# Patient Record
Sex: Male | Born: 1970
Health system: Southern US, Community
[De-identification: ages and names within clinical notes are randomized; demographics above are authoritative.]

## PROBLEM LIST (undated history)

## (undated) DIAGNOSIS — I214 Non-ST elevation (NSTEMI) myocardial infarction: Principal | ICD-10-CM

## (undated) DIAGNOSIS — I739 Peripheral vascular disease, unspecified: Secondary | ICD-10-CM

## (undated) DIAGNOSIS — I2510 Atherosclerotic heart disease of native coronary artery without angina pectoris: Principal | ICD-10-CM

## (undated) DIAGNOSIS — I7300 Raynaud's syndrome without gangrene: Secondary | ICD-10-CM

## (undated) DIAGNOSIS — M303 Mucocutaneous lymph node syndrome [Kawasaki]: Secondary | ICD-10-CM

## (undated) DIAGNOSIS — I10 Essential (primary) hypertension: Secondary | ICD-10-CM

## (undated) DIAGNOSIS — J189 Pneumonia, unspecified organism: Secondary | ICD-10-CM

## (undated) DIAGNOSIS — R0600 Dyspnea, unspecified: Secondary | ICD-10-CM

## (undated) DIAGNOSIS — D649 Anemia, unspecified: Secondary | ICD-10-CM

## (undated) DIAGNOSIS — M1990 Unspecified osteoarthritis, unspecified site: Secondary | ICD-10-CM

## (undated) DIAGNOSIS — R011 Cardiac murmur, unspecified: Secondary | ICD-10-CM

## (undated) DIAGNOSIS — E785 Hyperlipidemia, unspecified: Secondary | ICD-10-CM

## (undated) HISTORY — DX: Raynaud's syndrome without gangrene: I73.00

## (undated) HISTORY — PX: TONSILLECTOMY: SUR1361

## (undated) HISTORY — DX: Hyperlipidemia, unspecified: E78.5

---
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
## 2000-11-09 ENCOUNTER — Emergency Department (HOSPITAL_COMMUNITY): Admission: EM | Admit: 2000-11-09 | Discharge: 2000-11-10 | Payer: Self-pay | Admitting: Emergency Medicine

## 2001-09-30 ENCOUNTER — Encounter: Payer: Self-pay | Admitting: Otolaryngology

## 2001-09-30 ENCOUNTER — Ambulatory Visit (HOSPITAL_COMMUNITY): Admission: RE | Admit: 2001-09-30 | Discharge: 2001-09-30 | Payer: Self-pay | Admitting: Otolaryngology

## 2007-09-09 HISTORY — PX: ROTATOR CUFF REPAIR: SHX139

## 2008-05-25 ENCOUNTER — Ambulatory Visit: Payer: Self-pay | Admitting: Family Medicine

## 2009-02-23 ENCOUNTER — Ambulatory Visit: Payer: Self-pay | Admitting: Family Medicine

## 2011-03-02 ENCOUNTER — Emergency Department (HOSPITAL_COMMUNITY)
Admission: EM | Admit: 2011-03-02 | Discharge: 2011-03-02 | Disposition: A | Discharge status: Home / Self Care | Payer: No Typology Code available for payment source | Attending: Emergency Medicine | Admitting: Emergency Medicine

## 2011-03-02 ENCOUNTER — Emergency Department (HOSPITAL_COMMUNITY): Payer: No Typology Code available for payment source

## 2011-03-02 DIAGNOSIS — M25529 Pain in unspecified elbow: Secondary | ICD-10-CM | POA: Insufficient documentation

## 2011-03-02 DIAGNOSIS — M545 Low back pain, unspecified: Secondary | ICD-10-CM | POA: Insufficient documentation

## 2011-03-02 DIAGNOSIS — R079 Chest pain, unspecified: Secondary | ICD-10-CM | POA: Insufficient documentation

## 2011-09-09 DIAGNOSIS — I214 Non-ST elevation (NSTEMI) myocardial infarction: Principal | ICD-10-CM

## 2011-09-09 HISTORY — DX: Non-ST elevation (NSTEMI) myocardial infarction: I21.4

## 2011-09-09 HISTORY — PX: OTHER SURGICAL HISTORY: SHX169

## 2012-05-09 HISTORY — PX: CARDIAC CATHETERIZATION: SHX172

## 2012-05-19 ENCOUNTER — Other Ambulatory Visit: Payer: Self-pay | Admitting: Cardiology

## 2012-05-19 ENCOUNTER — Encounter (HOSPITAL_COMMUNITY): Payer: Self-pay | Admitting: Pharmacy Technician

## 2012-05-20 ENCOUNTER — Encounter (HOSPITAL_COMMUNITY): Payer: Self-pay | Admitting: Pharmacy Technician

## 2012-05-20 NOTE — H&P (Signed)
Subjective:     CC:    1. REFERRED DR Casimiro Needle HILTS [phone] EVALUATE PALPITATIONS.        HPI:  General:  41 year old here for evaluation of palpitations. he's had intermittent palpitations over the past month or so usually lasting less than one minute. In May had an episode when cutting grass, felt an incredible pressure and pain across chest wall. Checked his pulse and felt weak, different rhythm. A few minutes later it happened again. Pressure was occuring more often. Increased frquency. They are quite random but sometimes with exertion. It is a strange sensation for him. Pressure and pain will ease up after kneeling on ground. Felt it at night one time without exertion. he is not taking any supplements. He is currently on a green diet. He does admit to daily marijuana use. He uses this for his arthritis he states. His father had a heart arrhythmia as well as coronary artery disease. He is trying to lose more weight. He has lost approximally 20 pounds on a healthy green diet. No caffeine, decreasing alcohol. Overall he feels better except for palpitations.  .        ROS:  The other elements of the review of systems are negative (12 total elements).       Gyn History:        OB History:        Surgical History: rt shoulder rotator cuff repain 2010, left elbow surgery 1984.        Hospitalization/Major Diagnostic Procedure: surgeries .        Family History: Father: alive 73 yrs heart failure Mother: alive 80 yrs COPD Brother 1: alive 35 yrs A + W Sister 1: alive 40 yrs CVA        Social History:  General:  History of smoking  cigarettes: Never smoked no Smoking.  Alcohol: yes, beer, 2+ per week.  Caffeine: yes, coffee, 2+ servings daily.  Recreational drug use: yes, MJ daily.  Exercise: yes, walks, daily.  Occupation: lawn care.  Marital Status: single.  Children: 2, son (s).        Medications: Ibuprofen 200 MG Tablet 2-4 tabs 2 X weekly, Norco 7.5-325 MG Tablet 1  tablet as needed for pain every 6 hrs, Medication List reviewed and reconciled with the patient       Allergies: N.K.D.A.      Objective:     Vitals: Wt 221, Wt change -11.3 lb, Ht 68.5, BMI 33.11, Pulse sitting 76, BP sitting 144/100, Repeat BP 142/82.       Examination:  General Examination:  GENERAL APPEARANCE alert, oriented, NAD, pleasant.  SKIN: normal, no rash.  HEENT: normal.  HEAD: [place]/AT.  EYES: EOMI, Conjunctiva clear.  NECK: supple, FROM, without evidence of thyromegaly, adenopathy, or bruits, no jugular venous distention (JVD).  LUNGS: clear to auscultation bilaterally, no wheezes, rhonchi, rales, regular breathing rate and effort.  HEART: regular rate and rhythm, no S3, S4, murmur or rub, point of maximul impulse (PMI) normal.  ABDOMEN: soft, non-tender/non-distended, bowel sounds present, no masses palpated, no bruit.  EXTREMITIES: no clubbing, no edema, pulses 2 plus bilaterally.  NEUROLOGIC EXAM: non-focal exam, alert and oriented x 3.  PERIPHERAL PULSES: normal (2+) bilaterally.  LYMPH NODES: no cervical adenopathy.  PSYCH affect normal.        Assessment:     Assessment:  1. Chest pressure - 786.59 (Primary)  2. Palpitations - 785.1  3. Observation for suspected cardiovascular disease - V71.7  4.  Abnormal ECG - 794.31    Plan:     1. Chest pressure  Diagnostic Imaging:EC Stress Test [facility]mark (Ordered for 05/14/2012) Abnormal, nonsustained ventricular tachycardia, severe chest pain, ST segment depression Abnormal exercise treadmill test with nonsustained ventricular tachycardia, ST segment depression diffusely, symptoms of chest discomfort. High risk study. Nuclear perfusion images were not performed at stress due to patient's severe symptoms, radioisotope was not injected. I discussed findings with patient and we have decided to proceed with cardiac catheterization. Radial artery approach preferable. I have prescribed him both nitroglycerin as well as  metoprolol. If symptoms worsen or become more worrisome between now and urgent catheterization he does to probably go to the emergency department. By the time recovery phase was over, his symptoms had resolved. Risks and benefits of the catheterization were discussed including stroke, heart attack, death. SKAINS,MARK 2012-06-06 09:07:34 AM > discussed with patient., EC Echocardiogram (Ordered for 05/14/2012)  Given his symptoms, exertional chest discomfort, EKG findings of T wave inversion in the precordial leads, I would like to proceed with nuclear stress test.       2. Palpitations  Diagnostic Imaging:EC Holter 24hr (Ordered for 05/14/2012)  Certainly may be PVCs or PACs. I want to make sure that these or not adverse arrhythmias such as ventricular tachycardia. I will check a 24-hour monitor.       3. Observation for suspected cardiovascular disease  Diagnostic Imaging:EKG T-wave inversion in the precordial leads, Plummer,Wanda 05/14/2012 11:04:05 AM > SKAINS,MARK 05/14/2012 11:10:06 AM > cannot exclude ischemia.       4. Abnormal ECG  T-wave inversion noted in the precordial leads. Cannot exclude ischemia. Checking stress test. I will also check an echocardiogram to ensure proper structure and function. Sometimes these EKG findings are seen in left ventricular hypertrophy.        Immunizations:        Labs:        Procedure Codes: 45409 EKG I AND R       Preventive:   CC: Dr. Prince Rome.

## 2012-05-21 ENCOUNTER — Encounter (HOSPITAL_COMMUNITY)
Admission: RE | Disposition: A | Discharge status: Home / Self Care | Payer: Self-pay | Source: Ambulatory Visit | Attending: Cardiology

## 2012-05-21 ENCOUNTER — Ambulatory Visit (HOSPITAL_COMMUNITY)
Admission: RE | Admit: 2012-05-21 | Discharge: 2012-05-22 | Disposition: A | Discharge status: Home / Self Care | Payer: Managed Care, Other (non HMO) | Source: Ambulatory Visit | Attending: Cardiology | Admitting: Cardiology

## 2012-05-21 ENCOUNTER — Encounter (HOSPITAL_COMMUNITY): Payer: Self-pay | Admitting: Cardiology

## 2012-05-21 DIAGNOSIS — Z955 Presence of coronary angioplasty implant and graft: Secondary | ICD-10-CM

## 2012-05-21 DIAGNOSIS — R9439 Abnormal result of other cardiovascular function study: Secondary | ICD-10-CM | POA: Diagnosis present

## 2012-05-21 DIAGNOSIS — I2510 Atherosclerotic heart disease of native coronary artery without angina pectoris: Secondary | ICD-10-CM | POA: Insufficient documentation

## 2012-05-21 HISTORY — DX: Atherosclerotic heart disease of native coronary artery without angina pectoris: I25.10

## 2012-05-21 HISTORY — PX: PERCUTANEOUS CORONARY STENT INTERVENTION (PCI-S): SHX5485

## 2012-05-21 HISTORY — PX: LEFT HEART CATHETERIZATION WITH CORONARY ANGIOGRAM: SHX5451

## 2012-05-21 SURGERY — LEFT HEART CATHETERIZATION WITH CORONARY ANGIOGRAM
Anesthesia: LOCAL

## 2012-05-21 MED ORDER — PRASUGREL HCL 10 MG PO TABS
ORAL_TABLET | ORAL | Status: AC
  Filled 2012-05-21: qty 6

## 2012-05-21 MED ORDER — ALPRAZOLAM 0.25 MG PO TABS
0.2500 mg | ORAL_TABLET | Freq: Every evening | ORAL | Status: AC | PRN
  Administered 2012-05-21: 21:00:00 0.25 mg via ORAL
  Filled 2012-05-21: qty 1

## 2012-05-21 MED ORDER — DIAZEPAM 5 MG PO TABS
5.0000 mg | ORAL_TABLET | ORAL | Status: AC
  Administered 2012-05-21: 5 mg via ORAL

## 2012-05-21 MED ORDER — ASPIRIN 81 MG PO CHEW
81.0000 mg | CHEWABLE_TABLET | Freq: Every day | ORAL | Status: DC
  Filled 2012-05-21: qty 1

## 2012-05-21 MED ORDER — METOPROLOL SUCCINATE ER 50 MG PO TB24
50.0000 mg | ORAL_TABLET | Freq: Every day | ORAL | Status: DC
  Administered 2012-05-21: 16:00:00 50 mg via ORAL
  Filled 2012-05-21 (×2): qty 1

## 2012-05-21 MED ORDER — FENTANYL CITRATE 0.05 MG/ML IJ SOLN
INTRAMUSCULAR | Status: AC
  Filled 2012-05-21: qty 2

## 2012-05-21 MED ORDER — ASPIRIN 81 MG PO CHEW
324.0000 mg | CHEWABLE_TABLET | ORAL | Status: AC
  Administered 2012-05-21: 324 mg via ORAL

## 2012-05-21 MED ORDER — VERAPAMIL HCL 2.5 MG/ML IV SOLN
INTRAVENOUS | Status: AC
  Filled 2012-05-21: qty 2

## 2012-05-21 MED ORDER — NITROGLYCERIN 0.2 MG/ML ON CALL CATH LAB
INTRAVENOUS | Status: AC
  Filled 2012-05-21: qty 1

## 2012-05-21 MED ORDER — PRASUGREL HCL 10 MG PO TABS
10.0000 mg | ORAL_TABLET | Freq: Every day | ORAL | Status: DC
  Administered 2012-05-22: 10 mg via ORAL
  Filled 2012-05-21 (×2): qty 1

## 2012-05-21 MED ORDER — ATORVASTATIN CALCIUM 40 MG PO TABS
40.0000 mg | ORAL_TABLET | Freq: Every day | ORAL | Status: DC
  Administered 2012-05-21: 40 mg via ORAL
  Filled 2012-05-21 (×2): qty 1

## 2012-05-21 MED ORDER — ONDANSETRON HCL 4 MG/2ML IJ SOLN: 4.0000 mg | Freq: Four times a day (QID) | INTRAMUSCULAR | Status: DC | PRN

## 2012-05-21 MED ORDER — ACETAMINOPHEN 325 MG PO TABS
650.0000 mg | ORAL_TABLET | ORAL | Status: DC | PRN
  Administered 2012-05-21 (×2): 650 mg via ORAL
  Filled 2012-05-21 (×2): qty 2

## 2012-05-21 MED ORDER — HEPARIN SODIUM (PORCINE) 1000 UNIT/ML IJ SOLN
INTRAMUSCULAR | Status: AC
  Filled 2012-05-21: qty 1

## 2012-05-21 MED ORDER — DIAZEPAM 5 MG PO TABS
ORAL_TABLET | ORAL | Status: AC
  Administered 2012-05-21: 5 mg via ORAL
  Filled 2012-05-21: qty 1

## 2012-05-21 MED ORDER — MIDAZOLAM HCL 2 MG/2ML IJ SOLN
INTRAMUSCULAR | Status: AC
  Filled 2012-05-21: qty 2

## 2012-05-21 MED ORDER — SODIUM CHLORIDE 0.9 % IJ SOLN: 3.0000 mL | INTRAMUSCULAR | Status: DC | PRN

## 2012-05-21 MED ORDER — BIVALIRUDIN 250 MG IV SOLR
INTRAVENOUS | Status: AC
  Filled 2012-05-21: qty 250

## 2012-05-21 MED ORDER — MORPHINE SULFATE 4 MG/ML IJ SOLN
INTRAMUSCULAR | Status: AC
  Filled 2012-05-21: qty 1

## 2012-05-21 MED ORDER — DIAZEPAM 5 MG PO TABS
5.0000 mg | ORAL_TABLET | Freq: Once | ORAL | Status: AC
  Administered 2012-05-21: 5 mg via ORAL

## 2012-05-21 MED ORDER — MORPHINE SULFATE 4 MG/ML IJ SOLN
1.0000 mg | INTRAMUSCULAR | Status: DC | PRN
  Administered 2012-05-21: 2 mg via INTRAVENOUS

## 2012-05-21 MED ORDER — LIDOCAINE HCL (PF) 1 % IJ SOLN
INTRAMUSCULAR | Status: AC
  Filled 2012-05-21: qty 30

## 2012-05-21 MED ORDER — SODIUM CHLORIDE 0.9 % IJ SOLN: 3.0000 mL | Freq: Two times a day (BID) | INTRAMUSCULAR | Status: DC

## 2012-05-21 MED ORDER — SODIUM CHLORIDE 0.9 % IV SOLN: INTRAVENOUS | Status: DC

## 2012-05-21 MED ORDER — SODIUM CHLORIDE 0.9 % IV SOLN: INTRAVENOUS | Status: AC

## 2012-05-21 MED ORDER — SODIUM CHLORIDE 0.9 % IV SOLN: 250.0000 mL | INTRAVENOUS | Status: DC | PRN

## 2012-05-21 MED ORDER — HEPARIN (PORCINE) IN NACL 2-0.9 UNIT/ML-% IJ SOLN
INTRAMUSCULAR | Status: AC
  Filled 2012-05-21: qty 1000

## 2012-05-21 MED ORDER — NITROGLYCERIN 0.4 MG SL SUBL: 0.4000 mg | SUBLINGUAL_TABLET | SUBLINGUAL | Status: DC | PRN

## 2012-05-21 MED ORDER — DIAZEPAM 5 MG PO TABS
ORAL_TABLET | ORAL | Status: AC
  Filled 2012-05-21: qty 1

## 2012-05-21 MED ORDER — ASPIRIN 81 MG PO CHEW
CHEWABLE_TABLET | ORAL | Status: AC
  Administered 2012-05-21: 324 mg via ORAL
  Filled 2012-05-21: qty 4

## 2012-05-21 NOTE — CV Procedure (Signed)
CARDIAC CATHETERIZATION  PROCEDURE:  Left heart catheterization with selective coronary angiography, left ventriculogram via the radial artery approach.  INDICATIONS:  41 year old male with abnormal exercise treadmill test, 7 beats of nonsustained ventricular tachycardia, polymorphic with severe chest pain, ST segment depression diffusely. He is nondiabetic. His father had coronary artery disease. Several uncles had coronary artery disease.  The risks, benefits, and details of the procedure were explained to the patient, including possibilities of stroke, heart attack, death, renal impairment, arterial damage, bleeding.  The patient verbalized understanding and wanted to proceed.  Informed written consent was obtained.  PROCEDURE TECHNIQUE:  Allen's test was performed pre-and post procedure and was normal. The right radial artery site was prepped and draped in a sterile fashion. One percent lidocaine was used for local anesthesia. Using the modified Seldinger technique a 5 French hydrophilic sheath was inserted into the radial artery without difficulty. 3 mg of verapamil was administered via the sheath. A Judkins right #4 catheter with the guidance of a Versicore wire was placed in the right coronary cusp and selectively cannulated the right coronary artery. After traversing the aortic arch, 5000 units of heparin IV was administered. A Judkins left #3.5 catheter was used to selectively cannulate the left main artery. Multiple views with hand injection of Omnipaque were obtained. Catheter a pigtail catheter was used to cross into the left ventricle, hemodynamics were obtained, and a left ventriculogram was performed in the RAO position with power injection. Following the procedure, sheath was removed, patient was hemodynamically stable, hemostasis was maintained with a Terumo T band.   CONTRAST:  Total of 65 ml.    FLOUROSCOPY TIME: 2.5 min.  COMPLICATIONS:  None.    HEMODYNAMICS:  Aortic pressure was  135/43mmHg; LV systolic pressure was ; LVEDP .  There was no gradient between the left ventricle and aorta.    ANGIOGRAPHIC DATA:    Left main: No angiographically significant disease.  Left anterior descending (LAD): There is a focal 95% stenosis in the mid LAD at the bifurcation of the first diagonal branch. The diagonal branch is quite large in caliber. The remainder of the vessel is widely patent. There is hazy calcification in the region of the stenosis.  Circumflex artery (CIRC): 1 large obtuse marginal branch, tortuous. Large in caliber, no angiographically significant disease.  Right coronary artery (RCA): No angiographically significant disease. Dominant vessel giving rise to the PDA.  LEFT VENTRICULOGRAM:  Left ventricular angiogram was done in the 30 RAO projection and revealed normal left ventricular wall motion and systolic function with an estimated ejection fraction of 60%.   IMPRESSIONS:  95% mid LAD lesion, mildly calcified with a branching diagonal just prior to the lesion. Normal left ventricular systolic function.  LVEDP 8 mmHg.  Ejection fraction 60%.  RECOMMENDATION:  I discussed case with patient, Dr. Eldridge Dace and we will proceed with percutaneous intervention. I explained risks of emergency bypass, stroke, heart attack, death.

## 2012-05-21 NOTE — CV Procedure (Signed)
PROCEDURE:  PCI LAD  INDICATIONS:  High risk stress test showing polymorphic VT with exercise; class 3 angina  The risks, benefits, and details of the procedure were explained to the patient.  The patient verbalized understanding and wanted to proceed.  Informed written consent was obtained.  PROCEDURE TECHNIQUE: The diagnostic catheterization was performed by Dr. Anne Fu which revealed a 90% mid LAD stenosis at the origin of the diagonal.  A 6 French sheath was used.  Angiomax used for anticoagulation.  An ACT was used to confirm that the Angiomax is therapeutic.  A CLS 3.0 guiding catheters used to engage the left main.  A pro-water wire was placed down the LAD.  A BMW wire was placed into the adjacent diagonal vessel.  A 2.0 x 12 balloon was advanced to the LAD stenosis and inflated to 12 atmospheres.  A 2.5 x 10 angiosculpt balloon was then advanced to the lesion and inflated to 10 atmospheres.  A 3.5 x 24 Promus drug-eluting stent was then deployed across the entire area disease in the mid LAD and across the ostium of the diagonal vessel adjacent to the lesion.  This is deployed at 12 atmospheres.  It was initially noted that the stent was not occlusive when we're positioning it.  Then the stent did become occlusive such that we did not see the distal vessel very well.  The stent was deployed.  Subsequent angiography revealed a new stenosis at the distal edge of the stent of 90%.  Several doses of nitroglycerin were given to see if this would relieve spasm, and particularly the cause of this lesion.  There was no significant change.  A 2.0 x 15 balloon was used to predilate this area.  A 3.0 x 12 Promus stent was then placed in overlapping fashion at the distal edge of the previously placed stent.  He was inflated 12 atmospheres.  There is an excellent angiographic result.  TIMI-3 flow was restored.  The BMW wire was placed back into the diagonal through the stent struts.  The entire stented area was  postdilated with a 3.5 x 15 Ruskin trek balloon inflated to 16 atmospheres in the mid area and 18 atmospheres proximally.  Several additional doses of intracoronary nitroglycerin were administered.    Flow remained in the diagonal vessel, but there was an ostial 80% stenosis in the diagonal.  A TR band was used for hemostasis.   CONTRAST:  Total of 100 cc for the PCI.  COMPLICATIONS:  None.     IMPRESSIONS:  1. Successful revascularization of the mid LAD with 2 overlapping Promus drug-eluting stents. 3.0 x 12, and 3.5 x 24 more proximally.  Some plaque shift noted into a medium-sized diagonal vessel but TIMI-3 flow remained in this vessel.  The patient had typical anginal symptoms with balloon inflation.  RECOMMENDATION:  He'll be watched overnight, given the plaque shift into the diagonal.  Would use Effient for 30 days along with aspirin.  Following that period, could consider switching to Plavix.  He will need dual antiplatelet therapy for at least a year if not longer.  Would prefer using the stronger antiplatelet agent initially do to the plaque shift in the diagonal and wanting to maintain maximal flow in this branch vessel.  Continue aggressive secondary prevention.

## 2012-05-22 LAB — CBC
Hemoglobin: 14.8 g/dL (ref 13.0–17.0)
MCV: 88.9 fL (ref 78.0–100.0)
Platelets: 269 10*3/uL (ref 150–400)
RBC: 5.04 MIL/uL (ref 4.22–5.81)
WBC: 8.8 10*3/uL (ref 4.0–10.5)

## 2012-05-22 LAB — BASIC METABOLIC PANEL
CO2: 29 mEq/L (ref 19–32)
Chloride: 103 mEq/L (ref 96–112)
Creatinine, Ser: 1.02 mg/dL (ref 0.50–1.35)

## 2012-05-22 MED ORDER — ATORVASTATIN CALCIUM 40 MG PO TABS: 40.0000 mg | ORAL_TABLET | Freq: Every day | ORAL | Status: DC

## 2012-05-22 MED ORDER — ASPIRIN 81 MG PO CHEW: 81.0000 mg | CHEWABLE_TABLET | Freq: Every day | ORAL | Status: AC

## 2012-05-22 MED ORDER — PRASUGREL HCL 10 MG PO TABS: 10.0000 mg | ORAL_TABLET | Freq: Every day | ORAL | Status: DC

## 2012-05-22 MED ORDER — NITROGLYCERIN 0.4 MG SL SUBL: 0.4000 mg | SUBLINGUAL_TABLET | SUBLINGUAL | Status: DC | PRN

## 2012-05-22 NOTE — Progress Notes (Signed)
CARDIAC REHAB PHASE I   PRE:  Rate/Rhythm: 67 SR  BP:  Supine: [address]  Sitting:   Standing:    SaO2: 99 RA  MODE:  Ambulation: 1000 ft   POST:  Rate/Rhythem: 85 SR  BP:  Supine:   Sitting: 134/90  Standing:    SaO2:  0735-0850 Pt tolerated ambulation well without c/o of cp or SOB. VS stable. Completed discharge education with tp. He voices understanding. Pt agrees to Outpt. CRP in [facility], will send referral.  Beatrix Fetters

## 2012-05-22 NOTE — Discharge Summary (Signed)
Patient ID: MEHRAN GUDERIAN MRN: [id number] DOB/AGE: 11-17-1970 41 y.o.  Admit date: 05/21/2012 Discharge date: 05/22/2012  Primary Discharge Diagnosis: Severe LAD CAD Secondary Discharge Diagnosis: Family history of CAD  Significant Diagnostic Studies: Cath 9/13 Successful revascularization of the mid LAD with 2 overlapping Promus drug-eluting stents. 3.0 x 12, and 3.5 x 24 more proximally. Some plaque shift noted into a medium-sized diagonal vessel but TIMI-3 flow remained in this vessel. The patient had typical anginal symptoms with balloon inflation.  Left main: No angiographically significant disease.  Left anterior descending (LAD): There is a focal 95% stenosis in the mid LAD at the bifurcation of the first diagonal branch. The diagonal branch is quite large in caliber. The remainder of the vessel is widely patent. There is hazy calcification in the region of the stenosis.  Circumflex artery (CIRC): 1 large obtuse marginal branch, tortuous. Large in caliber, no angiographically significant disease.  Right coronary artery (RCA): No angiographically significant disease. Dominant vessel giving rise to the PDA.  LEFT VENTRICULOGRAM: Left ventricular angiogram was done in the 30 RAO projection and revealed normal left ventricular wall motion and systolic function with an estimated ejection fraction of 60%.  IMPRESSIONS:  1. 95% mid LAD lesion, mildly calcified with a branching diagonal just prior to the lesion. 2. Normal left ventricular systolic function. LVEDP 8 mmHg. Ejection fraction 60%.  Did well overnight. No issues. No CP, ambulating well. No SOB.  41 year old male with abnormal exercise treadmill test, 7 beats of nonsustained ventricular tachycardia, polymorphic with severe chest pain, ST segment depression diffusely. He is nondiabetic. His father had coronary artery disease. Several uncles had coronary artery disease.   Discharge Exam: Blood pressure 131/84, pulse 65,  temperature 97.8 F (36.6 C), temperature source Oral, resp. rate 12, height 5\' 10"  (1.778 m), weight 99.5 kg (219 lb 5.7 oz), SpO2 99.00%.    GEN: AAO x 3 in NAD CV RRR no M LUNGS CTA ABD soft +BS EXT - rad site 2+ pulse no hematoma   FOLLOW UP PLANS AND APPOINTMENTS Discharge Orders    Future Orders Please Complete By Expires   Diet - low sodium heart healthy      Increase activity slowly          Medication List     As of 05/22/2012  7:15 AM    TAKE these medications         aspirin 81 MG chewable tablet   Chew 1 tablet (81 mg total) by mouth daily.      atorvastatin 40 MG tablet   Commonly known as: LIPITOR   Take 1 tablet (40 mg total) by mouth daily at 6 PM.      Glucosamine HCl 1000 MG Tabs   Take 1,000 mg by mouth 2 (two) times daily.      metoprolol succinate 50 MG 24 hr tablet   Commonly known as: TOPROL-XL   Take 50 mg by mouth daily. Take with or immediately following a meal.      nitroGLYCERIN 0.4 MG SL tablet   Commonly known as: NITROSTAT   Place 0.4 mg under the tongue every 5 (five) minutes as needed. For chest pain      prasugrel 10 MG Tabs   Commonly known as: EFFIENT   Take 1 tablet (10 mg total) by mouth daily.           Follow-up Information    Follow up with Donato Schultz, MD. In 1 week.   Contact information:  [address] [place] [address] [phone]          BRING ALL MEDICATIONS WITH YOU TO FOLLOW UP APPOINTMENTS  Time spent with patient to include physician time: Signed: SKAINS, MARK 05/22/2012, 7:15 AM

## 2012-05-22 NOTE — Plan of Care (Signed)
Problem: Phase I Progression Outcomes Goal: Pain controlled with appropriate interventions Outcome: Completed/Met Date Met:  05/22/12 Patient painfree tonight Goal: Initial discharge plan identified Outcome: Completed/Met Date Met:  05/22/12 Planned discharge in am to home Goal: Voiding-avoid urinary catheter unless indicated Outcome: Completed/Met Date Met:  05/22/12 Urinating adequate amounts in urinal Goal: Hemodynamically stable Outcome: Completed/Met Date Met:  05/22/12 VSS Goal: Distal pulses equal to baseline Outcome: Completed/Met Date Met:  05/22/12 Distal pulses, dp and radial +2 , right radial site positive reverse allens, right radial and right ulnar +2 palpable Goal: Vascular site scale level 0 - I Vascular Site Scale Level 0: No bruising/bleeding/hematoma Level I (Mild): Bruising/Ecchymosis, minimal bleeding/ooozing, palpable hematoma < 3 cm Level II (Moderate): Bleeding not affecting hemodynamic parameters, pseudoaneurysm, palpable hematoma > 3 cm Outcome: Completed/Met Date Met:  05/22/12 Level 0 gauze dressing dry and intact Goal: Post Cath/PCI return to appropriate Path Outcome: Not Applicable Date Met:  05/22/12 Discharge to home in am no need to add additional care plan

## 2012-05-23 NOTE — Interval H&P Note (Signed)
History and Physical Interval Note:  05/23/2012 6:33 AM  Gabriel Anderson  has presented today for surgery, with the diagnosis of Chest pain  The various methods of treatment have been discussed with the patient and family. After consideration of risks, benefits and other options for treatment, the patient has consented to  Procedure(s) (LRB) with comments: LEFT HEART CATHETERIZATION WITH CORONARY ANGIOGRAM (N/A) PERCUTANEOUS CORONARY STENT INTERVENTION (PCI-S) () as a surgical intervention .  The patient's history has been reviewed, patient examined, no change in status, stable for surgery.  I have reviewed the patient's chart and labs.  Questions were answered to the patient's satisfaction.     Gabriel Anderson

## 2012-05-23 NOTE — H&P (View-Only) (Signed)
Subjective:     CC:    1. REFERRED DR MICHAEL HILTS [phone] EVALUATE PALPITATIONS.        HPI:  General:  41-year-old here for evaluation of palpitations. he's had intermittent palpitations over the past month or so usually lasting less than one minute. In May had an episode when cutting grass, felt an incredible pressure and pain across chest wall. Checked his pulse and felt weak, different rhythm. A few minutes later it happened again. Pressure was occuring more often. Increased frquency. They are quite random but sometimes with exertion. It is a strange sensation for him. Pressure and pain will ease up after kneeling on ground. Felt it at night one time without exertion. he is not taking any supplements. He is currently on a green diet. He does admit to daily marijuana use. He uses this for his arthritis he states. His father had a heart arrhythmia as well as coronary artery disease. He is trying to lose more weight. He has lost approximally 20 pounds on a healthy green diet. No caffeine, decreasing alcohol. Overall he feels better except for palpitations.  .        ROS:  The other elements of the review of systems are negative (12 total elements).       Gyn History:        OB History:        Surgical History: rt shoulder rotator cuff repain 2010, left elbow surgery 1984.        Hospitalization/Major Diagnostic Procedure: surgeries .        Family History: Father: alive 62 yrs heart failure Mother: alive 67 yrs COPD Brother 1: alive 35 yrs A + W Sister 1: alive 40 yrs CVA        Social History:  General:  History of smoking  cigarettes: Never smoked no Smoking.  Alcohol: yes, beer, 2+ per week.  Caffeine: yes, coffee, 2+ servings daily.  Recreational drug use: yes, MJ daily.  Exercise: yes, walks, daily.  Occupation: lawn care.  Marital Status: single.  Children: 2, son (s).        Medications: Ibuprofen 200 MG Tablet 2-4 tabs 2 X weekly, Norco 7.5-325 MG Tablet 1  tablet as needed for pain every 6 hrs, Medication List reviewed and reconciled with the patient       Allergies: N.K.D.A.      Objective:     Vitals: Wt 221, Wt change -11.3 lb, Ht 68.5, BMI 33.11, Pulse sitting 76, BP sitting 144/100, Repeat BP 142/82.       Examination:  General Examination:  GENERAL APPEARANCE alert, oriented, NAD, pleasant.  SKIN: normal, no rash.  HEENT: normal.  HEAD: [place]/AT.  EYES: EOMI, Conjunctiva clear.  NECK: supple, FROM, without evidence of thyromegaly, adenopathy, or bruits, no jugular venous distention (JVD).  LUNGS: clear to auscultation bilaterally, no wheezes, rhonchi, rales, regular breathing rate and effort.  HEART: regular rate and rhythm, no S3, S4, murmur or rub, point of maximul impulse (PMI) normal.  ABDOMEN: soft, non-tender/non-distended, bowel sounds present, no masses palpated, no bruit.  EXTREMITIES: no clubbing, no edema, pulses 2 plus bilaterally.  NEUROLOGIC EXAM: non-focal exam, alert and oriented x 3.  PERIPHERAL PULSES: normal (2+) bilaterally.  LYMPH NODES: no cervical adenopathy.  PSYCH affect normal.        Assessment:     Assessment:  1. Chest pressure - 786.59 (Primary)  2. Palpitations - 785.1  3. Observation for suspected cardiovascular disease - V71.7  4.   Abnormal ECG - 794.31    Plan:     1. Chest pressure  Diagnostic Imaging:EC Stress Test [facility] (Ordered for 05/14/2012) Abnormal, nonsustained ventricular tachycardia, severe chest pain, ST segment depression Abnormal exercise treadmill test with nonsustained ventricular tachycardia, ST segment depression diffusely, symptoms of chest discomfort. High risk study. Nuclear perfusion images were not performed at stress due to patient's severe symptoms, radioisotope was not injected. I discussed findings with patient and we have decided to proceed with cardiac catheterization. Radial artery approach preferable. I have prescribed him both nitroglycerin as well as  metoprolol. If symptoms worsen or become more worrisome between now and urgent catheterization he does to probably go to the emergency department. By the time recovery phase was over, his symptoms had resolved. Risks and benefits of the catheterization were discussed including stroke, heart attack, death. SKAINS,MARK 05/20/2012 09:07:34 AM > discussed with patient., EC Echocardiogram (Ordered for 05/14/2012)  Given his symptoms, exertional chest discomfort, EKG findings of T wave inversion in the precordial leads, I would like to proceed with nuclear stress test.       2. Palpitations  Diagnostic Imaging:EC Holter 24hr (Ordered for 05/14/2012)  Certainly may be PVCs or PACs. I want to make sure that these or not adverse arrhythmias such as ventricular tachycardia. I will check a 24-hour monitor.       3. Observation for suspected cardiovascular disease  Diagnostic Imaging:EKG T-wave inversion in the precordial leads, Plummer,Wanda 05/14/2012 11:04:05 AM > SKAINS,MARK 05/14/2012 11:10:06 AM > cannot exclude ischemia.       4. Abnormal ECG  T-wave inversion noted in the precordial leads. Cannot exclude ischemia. Checking stress test. I will also check an echocardiogram to ensure proper structure and function. Sometimes these EKG findings are seen in left ventricular hypertrophy.        Immunizations:        Labs:        Procedure Codes: 93000 EKG I AND R       Preventive:   CC: Dr. Hilts.    

## 2012-05-24 MED FILL — Dextrose Inj 5%: INTRAVENOUS | Qty: 1000 | Status: AC

## 2012-06-01 ENCOUNTER — Encounter (HOSPITAL_COMMUNITY): Payer: Self-pay | Admitting: *Deleted

## 2012-06-01 ENCOUNTER — Inpatient Hospital Stay (HOSPITAL_COMMUNITY)
Admission: EM | Admit: 2012-06-01 | Discharge: 2012-06-03 | DRG: 282 | Disposition: A | Discharge status: Home / Self Care | Payer: Managed Care, Other (non HMO) | Attending: Cardiology | Admitting: Cardiology

## 2012-06-01 ENCOUNTER — Emergency Department (HOSPITAL_COMMUNITY): Payer: Managed Care, Other (non HMO)

## 2012-06-01 DIAGNOSIS — I214 Non-ST elevation (NSTEMI) myocardial infarction: Principal | ICD-10-CM | POA: Diagnosis present

## 2012-06-01 DIAGNOSIS — F1210 Cannabis abuse, uncomplicated: Secondary | ICD-10-CM | POA: Diagnosis present

## 2012-06-01 DIAGNOSIS — R079 Chest pain, unspecified: Secondary | ICD-10-CM

## 2012-06-01 DIAGNOSIS — M25519 Pain in unspecified shoulder: Secondary | ICD-10-CM | POA: Diagnosis present

## 2012-06-01 DIAGNOSIS — I10 Essential (primary) hypertension: Secondary | ICD-10-CM | POA: Diagnosis present

## 2012-06-01 DIAGNOSIS — F411 Generalized anxiety disorder: Secondary | ICD-10-CM | POA: Diagnosis present

## 2012-06-01 DIAGNOSIS — I498 Other specified cardiac arrhythmias: Secondary | ICD-10-CM | POA: Diagnosis present

## 2012-06-01 DIAGNOSIS — E7800 Pure hypercholesterolemia, unspecified: Secondary | ICD-10-CM

## 2012-06-01 DIAGNOSIS — I2510 Atherosclerotic heart disease of native coronary artery without angina pectoris: Secondary | ICD-10-CM | POA: Diagnosis present

## 2012-06-01 DIAGNOSIS — Z9861 Coronary angioplasty status: Secondary | ICD-10-CM

## 2012-06-01 DIAGNOSIS — I4949 Other premature depolarization: Secondary | ICD-10-CM | POA: Diagnosis present

## 2012-06-01 DIAGNOSIS — E785 Hyperlipidemia, unspecified: Secondary | ICD-10-CM | POA: Diagnosis present

## 2012-06-01 DIAGNOSIS — Z7982 Long term (current) use of aspirin: Secondary | ICD-10-CM

## 2012-06-01 HISTORY — DX: Non-ST elevation (NSTEMI) myocardial infarction: I21.4

## 2012-06-01 HISTORY — DX: Essential (primary) hypertension: I10

## 2012-06-01 LAB — CBC WITH DIFFERENTIAL/PLATELET
Eosinophils Relative: 4 % (ref 0–5)
HCT: 36.7 % — ABNORMAL LOW (ref 39.0–52.0)
Lymphocytes Relative: 39 % (ref 12–46)
Lymphs Abs: 3.3 10*3/uL (ref 0.7–4.0)
MCV: 87.2 fL (ref 78.0–100.0)
Monocytes Absolute: 0.8 10*3/uL (ref 0.1–1.0)
RBC: 4.21 MIL/uL — ABNORMAL LOW (ref 4.22–5.81)
RDW: 12.5 % (ref 11.5–15.5)
WBC: 8.4 10*3/uL (ref 4.0–10.5)

## 2012-06-01 LAB — COMPREHENSIVE METABOLIC PANEL
BUN: 9 mg/dL (ref 6–23)
CO2: 30 mEq/L (ref 19–32)
Calcium: 9.3 mg/dL (ref 8.4–10.5)
Creatinine, Ser: 0.98 mg/dL (ref 0.50–1.35)
GFR calc Af Amer: >90 mL/min (ref >90)
GFR calc non Af Amer: >90 mL/min (ref >90)
Glucose, Bld: 79 mg/dL (ref 70–99)

## 2012-06-01 LAB — PROTIME-INR: Prothrombin Time: 13.9 seconds (ref 11.6–15.2)

## 2012-06-01 LAB — TROPONIN I: Troponin I: <0.3 ng/mL (ref <0.30)

## 2012-06-01 MED ORDER — NITROGLYCERIN IN D5W 200-5 MCG/ML-% IV SOLN
3.0000 ug/min | INTRAVENOUS | Status: DC
  Administered 2012-06-01: 3.333 ug/min via INTRAVENOUS
  Filled 2012-06-01: qty 250

## 2012-06-01 MED ORDER — NITROGLYCERIN 0.4 MG SL SUBL: 0.4000 mg | SUBLINGUAL_TABLET | SUBLINGUAL | Status: DC | PRN

## 2012-06-01 MED ORDER — ATORVASTATIN CALCIUM 40 MG PO TABS
40.0000 mg | ORAL_TABLET | Freq: Every day | ORAL | Status: DC
  Administered 2012-06-02: 21:00:00 40 mg via ORAL
  Filled 2012-06-01 (×3): qty 1

## 2012-06-01 MED ORDER — ONDANSETRON HCL 4 MG/2ML IJ SOLN: 4.0000 mg | Freq: Four times a day (QID) | INTRAMUSCULAR | Status: DC | PRN

## 2012-06-01 MED ORDER — METOPROLOL SUCCINATE ER 25 MG PO TB24
25.0000 mg | ORAL_TABLET | Freq: Every day | ORAL | Status: DC
  Filled 2012-06-01 (×2): qty 1

## 2012-06-01 MED ORDER — SODIUM CHLORIDE 0.9 % IV BOLUS (SEPSIS)
1000.0000 mL | Freq: Once | INTRAVENOUS | Status: AC
  Administered 2012-06-01: 1000 mL via INTRAVENOUS

## 2012-06-01 MED ORDER — HEPARIN BOLUS VIA INFUSION
4000.0000 [IU] | Freq: Once | INTRAVENOUS | Status: AC
  Administered 2012-06-02: 4000 [IU] via INTRAVENOUS

## 2012-06-01 MED ORDER — PRASUGREL HCL 10 MG PO TABS
10.0000 mg | ORAL_TABLET | Freq: Every day | ORAL | Status: DC
  Administered 2012-06-02 – 2012-06-03 (×2): 10 mg via ORAL
  Filled 2012-06-01 (×3): qty 1

## 2012-06-01 MED ORDER — HEPARIN (PORCINE) IN NACL 100-0.45 UNIT/ML-% IJ SOLN
1200.0000 [IU]/h | INTRAMUSCULAR | Status: DC
  Administered 2012-06-02: 1200 [IU]/h via INTRAVENOUS
  Filled 2012-06-01 (×3): qty 250

## 2012-06-01 MED ORDER — ACETAMINOPHEN 325 MG PO TABS: 650.0000 mg | ORAL_TABLET | ORAL | Status: DC | PRN

## 2012-06-01 MED ORDER — ASPIRIN 81 MG PO CHEW
81.0000 mg | CHEWABLE_TABLET | Freq: Every day | ORAL | Status: DC
  Administered 2012-06-02 – 2012-06-03 (×2): 81 mg via ORAL
  Filled 2012-06-01 (×2): qty 1

## 2012-06-01 MED ORDER — GLUCOSAMINE HCL 1000 MG PO TABS: 1000.0000 mg | ORAL_TABLET | Freq: Two times a day (BID) | ORAL | Status: DC

## 2012-06-01 MED ORDER — NITROGLYCERIN 0.4 MG SL SUBL
0.4000 mg | SUBLINGUAL_TABLET | SUBLINGUAL | Status: DC | PRN
  Administered 2012-06-01: 0.4 mg via SUBLINGUAL

## 2012-06-01 NOTE — H&P (Addendum)
Admit date: 06/01/2012 Referring Physician Dr. Jeraldine Loots Primary Cardiologist Dr. Anne Fu Chief complaint/reason for admission: chest pain  HPI: This is a 41yo WM with a history of CAD s/p very recent PCI of the mid LAD at the bifurcation of a large diagonal branch with no other CAD and normal LVF who was doing well post PCI until today.  He awakened today and said he did not feel right.  Midway through the day he had twinges of chest pressure that he had felt before the PCI.  He described that as shooting pains in his chest and around 7:45pm he started having chest pressure and took SL NTG x1.  He thought it might be heartburn and took a TUMS with no relief.  He took a second NTG and called EMS.  He was given another SL NTG x 2 with improvement in chest pain.  He continues to have intermittent chest pressure currently at a 1/10.  It did not radiate and was midsternal.  He was SOB at home along with diaphoresis and pale color per his wife.  Of note according to his PCI report there was some plaque shift into the diagonal during PCI.  He states he did not have any chest pressure during his PCI.  He also says that the symptoms he had tonight are identical to what he had on the treadmill in the office.      PMH:    Past Medical History  Diagnosis Date  . Coronary atherosclerosis of native coronary artery 05/21/2012    Cardiac catheterization 05/21/12-95% mid LAD lesion at the bifurcation of a large diagonal branch. Normal ejection fraction.  . Hypertension     PSH:    Past Surgical History  Procedure Date  . Cardiac catheterization 05/2012    PCI LAD at takeoff of large diagonal    ALLERGIES:   Review of patient's allergies indicates no known allergies.  Prior to Admit Meds:   (Not in a hospital admission) Family HX:   History reviewed. No pertinent family history. Social HX:    History   Social History  . Marital Status: Married    Spouse Name: N/A    Number of Children: N/A  . Years of  Education: N/A   Occupational History  . Not on file.   Social History Main Topics  . Smoking status: Never Smoker   . Smokeless tobacco: Not on file  . Alcohol Use: 0.0 oz/week    3-6 Cans of beer per week  . Drug Use: Yes    Special: Marijuana  . Sexually Active:    Other Topics Concern  . Not on file   Social History Narrative  . No narrative on file     ROS:  All 11 ROS were addressed and are negative except what is stated in the HPI  PHYSICAL EXAM Filed Vitals:   06/01/12 2207  BP: 116/79  Pulse: 50  Temp:   Resp: 22   General: Well developed, well nourished, in no acute distress Head: Eyes PERRLA, No xanthomas.   Normal cephalic and atramatic  Lungs:   Clear bilaterally to auscultation and percussion. Heart:   HRRR S1 S2 Pulses are 2+ & equal.            No carotid bruit. No JVD.  No abdominal bruits. No femoral bruits. Abdomen: Bowel sounds are positive, abdomen soft and non-tender without masses or  Hernia's noted. Extremities:   No clubbing, cyanosis or edema.  DP +1 Neuro: Alert and oriented X 3. Psych:  Good affect, responds appropriately   Labs:   Lab Results  Component Value Date   WBC 8.4 06/01/2012   HGB 12.5* 06/01/2012   HCT 36.7* 06/01/2012   MCV 87.2 06/01/2012   PLT 265 06/01/2012    Lab 06/01/12 2140  NA 141  K 3.8  CL 106  CO2 30  BUN 9  CREATININE 0.98  CALCIUM 9.3  PROT 5.8*  BILITOT 0.2*  ALKPHOS 59  ALT 18  AST 17  GLUCOSE 79   Lab Results  Component Value Date   TROPONINI <0.30 06/01/2012   No results found for this basename: PTT   Lab Results  Component Value Date   INR 1.08 06/01/2012        *RADIOLOGY REPORT*  Clinical Data: Chest discomfort. History of cardiac stents.  CHEST - 2 VIEW  Comparison: 03/02/2011.  Findings: Cardiopericardial silhouette within normal limits.  Mediastinal contours normal. Trachea midline. No airspace disease  or effusion. Partially radiopaque monitoring leads are  projected  over the chest, notably in the right upper lobe.  IMPRESSION:  No active cardiopulmonary disease.  Original Report Authenticated By: Andreas Newport, M.D.  Radiology:    EKG:  NSR no ST changes  ASSESSMENT:  1.  USAP still with 1/10 CP intermittently.  EKG shows no evidence of ischemia and initial troponin is normal. 2.  Single vessel CAD with PCI of LAD at the takeoff of a large diagonal with mention of plaque shift into diagonal during PCI.  ? Whether he has a jailed diagonal now causing angina. 3.  HTN 4.  Dyslipidemia 5.  Bradycardia  PLAN:   1.  Admit to stepdown 2.  Cycle cardiac enzymes 3.  Continue ASA/Effient/beta blocker/statin 4.  NPO after midnight 5.  Possible cath in am per Dr. Anne Fu decision 6.  IV Heparin gtt per pharmacy 7.  IV NTG gtt 8.  Decrease Toprol to 25mg  daily due to bradycardia  Quintella Reichert, MD  06/01/2012  11:10 PM

## 2012-06-01 NOTE — ED Provider Notes (Signed)
History     CSN: [id number]  Arrival date & time 06/01/12  2045   First MD Initiated Contact with Patient 06/01/12 2057      Chief Complaint  Patient presents with  . Chest Pain     HPI  The patient presents with concerns of chest pain.  He notes the pain began approximately 1.5 hours prior to arrival.  The pain as sternal, pressure-like, intermittent.  There is associated diaphoresis and nausea.  No dyspnea.  Pain improved with nitroglycerin. Notably, the patient has coronary intervention 2 weeks ago with placement of 2 stents in his mid LAD he notes that since this time is been compliant with his medication.  No interval chest pain.  Past Medical History  Diagnosis Date  . Coronary atherosclerosis of native coronary artery 05/21/2012    Cardiac catheterization 05/21/12-95% mid LAD lesion at the bifurcation of a large diagonal branch. Normal ejection fraction.  . Hypertension     History reviewed. No pertinent past surgical history.  No family history on file.  History  Substance Use Topics  . Smoking status: Not on file  . Smokeless tobacco: Not on file  . Alcohol Use:       Review of Systems  Constitutional:       Per HPI, otherwise negative  HENT:       Per HPI, otherwise negative  Eyes: Negative.   Respiratory:       Per HPI, otherwise negative  Cardiovascular:       Per HPI, otherwise negative  Gastrointestinal: Negative for vomiting.  Genitourinary: Negative.   Musculoskeletal:       Per HPI, otherwise negative  Skin: Negative.   Neurological: Negative for syncope.    Allergies  Review of patient's allergies indicates no known allergies.  Home Medications   Current Outpatient Rx  Name Route Sig Dispense Refill  . ASPIRIN 81 MG PO CHEW Oral Chew 1 tablet (81 mg total) by mouth daily. 30 tablet 12  . ATORVASTATIN CALCIUM 40 MG PO TABS Oral Take 1 tablet (40 mg total) by mouth daily at 6 PM. 30 tablet 12  . GLUCOSAMINE HCL 1000 MG PO TABS Oral Take  1,000 mg by mouth 2 (two) times daily.    [name] METOPROLOL SUCCINATE ER 50 MG PO TB24 Oral Take 50 mg by mouth daily. Take with or immediately following a meal.    . NITROGLYCERIN 0.4 MG SL SUBL Sublingual Place 0.4 mg under the tongue every 5 (five) minutes as needed. For chest pain    . PRASUGREL HCL 10 MG PO TABS Oral Take 1 tablet (10 mg total) by mouth daily. 30 tablet 12    BP 120/68  Pulse 48  Temp 98.1 F (36.7 C) (Oral)  Resp 11  SpO2 99%  Physical Exam  Nursing note and vitals reviewed. Constitutional: He is oriented to person, place, and time. He appears well-developed. No distress.  HENT:  Head: Normocephalic and atraumatic.  Eyes: Conjunctivae normal and EOM are normal.  Cardiovascular: Normal rate and regular rhythm.   Pulmonary/Chest: Effort normal. No stridor. No respiratory distress.  Abdominal: He exhibits no distension.  Musculoskeletal: He exhibits no edema.  Neurological: He is alert and oriented to person, place, and time.  Skin: Skin is warm and dry.  Psychiatric: He has a normal mood and affect.    ED Course  Procedures (including critical care time)   Labs Reviewed  CBC WITH DIFFERENTIAL  COMPREHENSIVE METABOLIC PANEL  LIPASE, BLOOD  TROPONIN I  PROTIME-INR   Dg Chest 2 View  06/01/2012  *RADIOLOGY REPORT*  Clinical Data: Chest discomfort.  History of cardiac stents.  CHEST - 2 VIEW  Comparison: 03/02/2011.  Findings:  Cardiopericardial silhouette within normal limits. Mediastinal contours normal. Trachea midline.  No airspace disease or effusion.  Partially radiopaque monitoring leads are projected over the chest, notably in the right upper lobe.  IMPRESSION: No active cardiopulmonary disease.   Original Report Authenticated By: Andreas Newport, M.D.      No diagnosis found.  Cardiac 55 sinus bradycardia abnormal Pulse ox 100% room air normal    Date: 06/01/2012  Rate: 57  Rhythm: sinus bradycardia  QRS Axis: normal  Intervals: normal  ST/T  Wave abnormalities: normal  Conduction Disutrbances:none  Narrative Interpretation:   Old EKG Reviewed: changes noted BORDERLINE  MDM  This patient presents after new chest pain.  Notably, the patient had recent bypass surgery.  A review of patient's procedure notes suggest possible post procedural effects.  On my exam the patient is in no distress with unremarkable vital signs, and largely reassuring evaluation.  However, given the recurrent episodes of pain, recent procedure, the patient will be admitted for further evaluation and management.  I discussed the case with our cardiologist who will admit the patient.   Gerhard Munch, MD 06/01/12 [phone]

## 2012-06-01 NOTE — ED Notes (Signed)
Patient transported to X-ray 

## 2012-06-01 NOTE — ED Notes (Signed)
Per EMS:  Pt has chest pain that started at 1945, pain is non radiating, some diaphoresis and some nausesa, no shortness of breath.  Pt took  2 SL nitro piror to EMS' arrival and EMS gave pt 324 of aspirin, and 1 NTG.   Pt is no longer complaining of any chest pain.  Pt had 4 stents placed on 9/13, pt had 95% blockage.  Pt said the pain was a tightness/pressure type pain.

## 2012-06-01 NOTE — Progress Notes (Signed)
ANTICOAGULATION CONSULT NOTE - Initial Consult  Pharmacy Consult for UFH Indication: USAP  No Known Allergies  Patient Measurements: Height: 5' 10.08" (178 cm) Weight: 219 lb 5.7 oz (99.5 kg) IBW/kg (Calculated) : 73.18  Heparin Dosing Weight: 93kg  Vital Signs: Temp: 98.1 F (36.7 C) (09/24 2047) Temp src: Oral (09/24 2047) BP: 116/79 mmHg (09/24 2207) Pulse Rate: 50  (09/24 2207)  Labs:  Alvira Philips 06/01/12 2140  HGB 12.5*  HCT 36.7*  PLT 265  APTT --  LABPROT 13.9  INR 1.08  HEPARINUNFRC --  CREATININE 0.98  CKTOTAL --  CKMB --  TROPONINI <0.30    Estimated Creatinine Clearance: 115.1 ml/min (by C-G formula based on Cr of 0.98).   Medical History: Past Medical History  Diagnosis Date  . Coronary atherosclerosis of native coronary artery 05/21/2012    Cardiac catheterization 05/21/12-95% mid LAD lesion at the bifurcation of a large diagonal branch. Normal ejection fraction.  . Hypertension     Medications:  Scheduled:    . aspirin  81 mg Oral Daily  . atorvastatin  40 mg Oral q1800  . metoprolol succinate  25 mg Oral Daily  . prasugrel  10 mg Oral Daily  . sodium chloride  1,000 mL Intravenous Once  . DISCONTD: Glucosamine HCl  1,000 mg Oral BID    Assessment: 41 y/o male patient with significant cardiac history admitted with chest pain requiring anticoagulation for r/o MI. EKG unremarkable with neg trop x1.  Goal of Therapy:  Heparin level 0.3-0.7 units/ml Monitor platelets by anticoagulation protocol: Yes   Plan:  Heparin 4000 unit IV bolus followed by infusion at 1200 units/hr. Check 6 hour heparin level with daily cbc and heparin level.  Verlene Mayer, PharmD, BCPS Pager [phone] 06/01/2012,11:40 PM

## 2012-06-01 NOTE — ED Notes (Signed)
Pt back from x-ray.

## 2012-06-01 NOTE — ED Notes (Signed)
Pt returnsa from xray, wife and D%R.Lockwood at bedside.

## 2012-06-02 ENCOUNTER — Encounter (HOSPITAL_COMMUNITY)
Admission: EM | Disposition: A | Discharge status: Home / Self Care | Payer: Self-pay | Source: Home / Self Care | Attending: Cardiology

## 2012-06-02 DIAGNOSIS — E785 Hyperlipidemia, unspecified: Secondary | ICD-10-CM | POA: Diagnosis present

## 2012-06-02 DIAGNOSIS — I214 Non-ST elevation (NSTEMI) myocardial infarction: Principal | ICD-10-CM | POA: Diagnosis present

## 2012-06-02 HISTORY — PX: LEFT HEART CATHETERIZATION WITH CORONARY ANGIOGRAM: SHX5451

## 2012-06-02 LAB — HEPARIN LEVEL (UNFRACTIONATED)
Heparin Unfractionated: 0.43 IU/mL (ref 0.30–0.70)
Heparin Unfractionated: 0.42 IU/mL (ref 0.30–0.70)

## 2012-06-02 LAB — TROPONIN I
Troponin I: 2.37 ng/mL (ref <0.30)
Troponin I: 3.1 ng/mL (ref <0.30)
Troponin I: <0.3 ng/mL (ref <0.30)

## 2012-06-02 LAB — CBC WITH DIFFERENTIAL/PLATELET
Basophils Absolute: 0 10*3/uL (ref 0.0–0.1)
Basophils Relative: 1 % (ref 0–1)
Eosinophils Absolute: 0.4 10*3/uL (ref 0.0–0.7)
Eosinophils Relative: 5 % (ref 0–5)
Lymphs Abs: 3.1 10*3/uL (ref 0.7–4.0)
MCH: 30.1 pg (ref 26.0–34.0)
MCHC: 34 g/dL (ref 30.0–36.0)
MCV: 88.6 fL (ref 78.0–100.0)
Platelets: 275 10*3/uL (ref 150–400)
RDW: 12.5 % (ref 11.5–15.5)

## 2012-06-02 LAB — COMPREHENSIVE METABOLIC PANEL
AST: 18 U/L (ref 0–37)
CO2: 28 mEq/L (ref 19–32)
Calcium: 9.4 mg/dL (ref 8.4–10.5)
Creatinine, Ser: 0.9 mg/dL (ref 0.50–1.35)
GFR calc non Af Amer: >90 mL/min (ref >90)
Total Protein: 6 g/dL (ref 6.0–8.3)

## 2012-06-02 LAB — BASIC METABOLIC PANEL
CO2: 28 mEq/L (ref 19–32)
Calcium: 9.2 mg/dL (ref 8.4–10.5)
Chloride: 109 mEq/L (ref 96–112)
Glucose, Bld: 101 mg/dL — ABNORMAL HIGH (ref 70–99)
Sodium: 142 mEq/L (ref 135–145)

## 2012-06-02 LAB — CBC
HCT: 39.3 % (ref 39.0–52.0)
Hemoglobin: 13 g/dL (ref 13.0–17.0)
MCV: 89.3 fL (ref 78.0–100.0)
RDW: 12.6 % (ref 11.5–15.5)
WBC: 7.5 10*3/uL (ref 4.0–10.5)

## 2012-06-02 LAB — CK TOTAL AND CKMB (NOT AT ARMC)
CK, MB: 8.1 ng/mL (ref 0.3–4.0)
Relative Index: 4.7 — ABNORMAL HIGH (ref 0.0–2.5)
Total CK: 171 U/L (ref 7–232)

## 2012-06-02 LAB — PROTIME-INR
INR: 1.09 (ref 0.00–1.49)
Prothrombin Time: 14 seconds (ref 11.6–15.2)

## 2012-06-02 LAB — TSH: TSH: 3.97 u[IU]/mL (ref 0.350–4.500)

## 2012-06-02 SURGERY — LEFT HEART CATHETERIZATION WITH CORONARY ANGIOGRAM
Anesthesia: LOCAL

## 2012-06-02 MED ORDER — ZOLPIDEM TARTRATE 5 MG PO TABS: 5.0000 mg | ORAL_TABLET | Freq: Every evening | ORAL | Status: DC | PRN

## 2012-06-02 MED ORDER — ISOSORBIDE MONONITRATE ER 30 MG PO TB24
30.0000 mg | ORAL_TABLET | Freq: Every day | ORAL | Status: DC
  Administered 2012-06-02 – 2012-06-03 (×2): 30 mg via ORAL
  Filled 2012-06-02 (×2): qty 1

## 2012-06-02 MED ORDER — NITROGLYCERIN 0.2 MG/ML ON CALL CATH LAB
INTRAVENOUS | Status: AC
  Filled 2012-06-02: qty 1

## 2012-06-02 MED ORDER — OXYCODONE-ACETAMINOPHEN 5-325 MG PO TABS
1.0000 | ORAL_TABLET | ORAL | Status: DC | PRN
  Administered 2012-06-02 – 2012-06-03 (×3): 2 via ORAL
  Filled 2012-06-02 (×3): qty 2

## 2012-06-02 MED ORDER — FENTANYL CITRATE 0.05 MG/ML IJ SOLN
INTRAMUSCULAR | Status: AC
  Filled 2012-06-02: qty 2

## 2012-06-02 MED ORDER — LIDOCAINE HCL (PF) 1 % IJ SOLN
INTRAMUSCULAR | Status: AC
  Filled 2012-06-02: qty 30

## 2012-06-02 MED ORDER — VERAPAMIL HCL 2.5 MG/ML IV SOLN
INTRAVENOUS | Status: AC
  Filled 2012-06-02: qty 2

## 2012-06-02 MED ORDER — ACETAMINOPHEN 325 MG PO TABS: 650.0000 mg | ORAL_TABLET | ORAL | Status: DC | PRN

## 2012-06-02 MED ORDER — DIAZEPAM 5 MG PO TABS
5.0000 mg | ORAL_TABLET | ORAL | Status: AC
  Administered 2012-06-02: 5 mg via ORAL
  Filled 2012-06-02: qty 1

## 2012-06-02 MED ORDER — ALPRAZOLAM 0.25 MG PO TABS
0.2500 mg | ORAL_TABLET | Freq: Two times a day (BID) | ORAL | Status: DC | PRN
  Filled 2012-06-02: qty 1

## 2012-06-02 MED ORDER — HEART ATTACK BOUNCING BOOK
Freq: Once | Status: AC
  Administered 2012-06-02: 22:00:00
  Filled 2012-06-02: qty 1

## 2012-06-02 MED ORDER — SODIUM CHLORIDE 0.9 % IV SOLN: INTRAVENOUS | Status: DC

## 2012-06-02 MED ORDER — ASPIRIN 81 MG PO CHEW
324.0000 mg | CHEWABLE_TABLET | ORAL | Status: AC
  Administered 2012-06-02: 324 mg via ORAL
  Filled 2012-06-02: qty 4

## 2012-06-02 MED ORDER — SODIUM CHLORIDE 0.9 % IV SOLN: 1.0000 mL/kg/h | INTRAVENOUS | Status: DC

## 2012-06-02 MED ORDER — HEPARIN SODIUM (PORCINE) 1000 UNIT/ML IJ SOLN
INTRAMUSCULAR | Status: AC
  Filled 2012-06-02: qty 1

## 2012-06-02 MED ORDER — SODIUM CHLORIDE 0.9 % IJ SOLN: 3.0000 mL | Freq: Two times a day (BID) | INTRAMUSCULAR | Status: DC

## 2012-06-02 MED ORDER — SODIUM CHLORIDE 0.9 % IV SOLN: 250.0000 mL | INTRAVENOUS | Status: DC | PRN

## 2012-06-02 MED ORDER — SODIUM CHLORIDE 0.9 % IV SOLN
INTRAVENOUS | Status: AC
  Administered 2012-06-02: 19:00:00 via INTRAVENOUS

## 2012-06-02 MED ORDER — SODIUM CHLORIDE 0.9 % IJ SOLN: 3.0000 mL | INTRAMUSCULAR | Status: DC | PRN

## 2012-06-02 MED ORDER — ONDANSETRON HCL 4 MG/2ML IJ SOLN: 4.0000 mg | Freq: Four times a day (QID) | INTRAMUSCULAR | Status: DC | PRN

## 2012-06-02 MED ORDER — MIDAZOLAM HCL 2 MG/2ML IJ SOLN
INTRAMUSCULAR | Status: AC
Start: 2012-06-02 — End: 2012-06-02
  Filled 2012-06-02: qty 2

## 2012-06-02 NOTE — CV Procedure (Signed)
CARDIAC CATHETERIZATION  PROCEDURE:  Left heart catheterization with selective coronary angiography, left ventriculogram via the radial artery approach.  INDICATIONS:  41 year old male with coronary artery disease status post 95% mid LAD stenosis at the bifurcation of a large diagonal branch who presented with chest pressure and developed a positive troponin of 3. He was taken to the cardiac catheterization lab to visualize stent placement. Stent was placed (x2) on 05/21/12.  The risks, benefits, and details of the procedure were explained to the patient, including possibilities of stroke, heart attack, death, renal impairment, arterial damage, bleeding.  The patient verbalized understanding and wanted to proceed.  Informed written consent was obtained.  PROCEDURE TECHNIQUE:  Allen's test was performed pre-and post procedure and was normal. The right radial artery site was prepped and draped in a sterile fashion. One percent lidocaine was used for local anesthesia. Using the modified Seldinger technique a 5 French hydrophilic sheath was inserted into the radial artery without difficulty. 3 mg of verapamil was administered via the sheath. A Judkins right #4 catheter with the guidance of a Versicore wire was placed in the right coronary cusp and selectively cannulated the right coronary artery. After traversing the aortic arch, 5000 units of heparin IV was administered. A Judkins left #3.5 catheter was used to selectively cannulate the left main artery. Multiple views with hand injection of Omnipaque were obtained. Catheter a pigtail catheter was used to cross into the left ventricle, hemodynamics were obtained, and a left ventriculogram was performed in the RAO position with power injection. Following the procedure, sheath was removed, patient was hemodynamically stable, hemostasis was maintained with a Terumo T band.   CONTRAST:  Total of 80 ml.    FLOUROSCOPY TIME: 3.7 min.  COMPLICATIONS:  None.     HEMODYNAMICS:  Aortic pressure was 141/67mmHg; LV systolic pressure was ; LVEDP .  There was no gradient between the left ventricle and aorta.    ANGIOGRAPHIC DATA:    Left main: Widely patent, gives rise to the LAD and circumflex artery.  Left anterior descending (LAD): The previously placed stents in the mid LAD are widely patent. There is no evidence of an edge dissection. There are several small septal branches that are heavily diseased peri-stent and IntraStent. These are not significantly changed from prior angiogram.  Circumflex artery (CIRC): There is mild haziness at the ostium of the AV groove circumflex but no flow limiting disease present. The distal region of this vessel is very small in caliber. There is a large obtuse marginal branch with no flow limiting disease present.  Right coronary artery (RCA): Minor proximal calcification noted. Minor luminal irregularities throughout this vessel. No angiographically significant disease.  LEFT VENTRICULOGRAM:  Left ventricular angiogram was done in the 30 RAO projection and revealed normal left ventricular wall motion and systolic function with an estimated ejection fraction of 55%.   IMPRESSIONS:  Previously placed LAD DES x2, mid LAD widely patent. Heavily diseased septal arcade. Normal left ventricular systolic function.  LVEDP 11 mmHg.  Ejection fraction 55%.  RECOMMENDATION:  Reassurance, medical management. I have reviewed the films with patient and wife as well as Dr. Eldridge Dace. I will place on low-dose isosorbide for at least one month. Continue with dual antiplatelet therapy. I'm currently holding his metoprolol extended release 25 mg because of bradycardia. Plan will be to discharge tomorrow if he is doing well.

## 2012-06-02 NOTE — Progress Notes (Signed)
TR BAND REMOVAL  LOCATION:  right radial  DEFLATED PER PROTOCOL:  yes  TIME BAND OFF / DRESSING APPLIED:   1545   SITE UPON ARRIVAL:   Level 0  SITE AFTER BAND REMOVAL:  Level 0  REVERSE ALLEN'S TEST:    positive  CIRCULATION SENSATION AND MOVEMENT:  Within Normal Limits  yes  COMMENTS:    

## 2012-06-02 NOTE — Progress Notes (Signed)
To cath lab by bed stable. 

## 2012-06-02 NOTE — Interval H&P Note (Signed)
History and Physical Interval Note:  06/02/2012 1:18 PM  Gabriel Anderson  has presented today for surgery, with the diagnosis of cp  The various methods of treatment have been discussed with the patient and family. After consideration of risks, benefits and other options for treatment, the patient has consented to  Procedure(s) (LRB) with comments: LEFT HEART CATHETERIZATION WITH CORONARY ANGIOGRAM (N/A) as a surgical intervention .  The patient's history has been reviewed, patient examined, no change in status, stable for surgery.  I have reviewed the patient's chart and labs.  Questions were answered to the patient's satisfaction.     Jaamal Farooqui

## 2012-06-02 NOTE — Progress Notes (Signed)
Dr.Skains notified with results of elevated CKMB plan is to continue to watch patient overnight.

## 2012-06-02 NOTE — Progress Notes (Signed)
CRITICAL VALUE ALERT  Critical value received:  Troponin 3.10  Date of notification:  06/02/2012  Time of notification:  0625  Critical value read back:yes  Nurse who received alert:  Georgia Lopes, RN, BSN  MD notified (1st page):  Dr. Kym Groom  Time of first page:  [phone]  MD notified (2nd page):  Time of second page:  Responding MD:  Dr. Kym Groom  Time MD responded:  [phone]

## 2012-06-02 NOTE — Progress Notes (Signed)
Subjective:  Chest pain improved. On NTG IV. Diaphoresis and pressure earlier. Trop now +  Objective:  Vital Signs in the last 24 hours: Temp:  [97.4 F (36.3 C)-98.1 F (36.7 C)] 97.6 F (36.4 C) (09/25 0700) Pulse Rate:  [44-57] 57  (09/25 0900) Resp:  [9-22] 12  (09/25 0900) BP: (116-126)/(65-82) 118/71 mmHg (09/25 0900) SpO2:  [97 %-100 %] 98 % (09/25 0900) Weight:  [99.5 kg (219 lb 5.7 oz)] 99.5 kg (219 lb 5.7 oz) (09/24 2300)  Intake/Output from previous day: 09/24 0701 - 09/25 0700 In: 91.1 [I.V.:91.1] Out: 800 [Urine:800]   Physical Exam: General: Well developed, well nourished, in no acute distress. Head:  Normocephalic and atraumatic. Lungs: Clear to auscultation and percussion. Heart: Normal S1 and S2.  No murmur, rubs or gallops.  Abdomen: soft, non-tender, positive bowel sounds. Extremities: No clubbing or cyanosis. No edema. Minor eccymosis at cath site. 2+ pulse.  Neurologic: Alert and oriented x 3.    Lab Results:  Basename 06/02/12 0518 06/01/12 2342  WBC 7.5 8.8  HGB 13.0 12.9*  PLT 266 275    Basename 06/02/12 0518 06/01/12 2342  NA 142 141  K 4.0 3.8  CL 109 107  CO2 28 28  GLUCOSE 101* 98  BUN 7 8  CREATININE 0.97 0.90    Basename 06/02/12 0518 06/01/12 2342  TROPONINI 3.10* <0.30   Hepatic Function Panel  Basename 06/01/12 2342  PROT 6.0  ALBUMIN 3.5  AST 18  ALT 19  ALKPHOS 59  BILITOT 0.2*  BILIDIR --  IBILI --   Imaging: Dg Chest 2 View  06/01/2012  *RADIOLOGY REPORT*  Clinical Data: Chest discomfort.  History of cardiac stents.  CHEST - 2 VIEW  Comparison: 03/02/2011.  Findings:  Cardiopericardial silhouette within normal limits. Mediastinal contours normal. Trachea midline.  No airspace disease or effusion.  Partially radiopaque monitoring leads are projected over the chest, notably in the right upper lobe.  IMPRESSION: No active cardiopulmonary disease.   Original Report Authenticated By: Andreas Newport, M.D.     Personally viewed.   Telemetry: Huston Foley sinus 40's Personally viewed.   EKG:  No ST changes, personally viewed.  Cardiac Studies:  Recent DES to LAD, jailed diag.   Assessment/Plan:   41 year old with NSTEMI  CAD/NSTEMI - discussed case with him and admitting Dr. Mayford Knife as well as Dr. Eldridge Dace. Will proceed with heart cath to evaluate recently placed LAD stent and diagonal. Radial access. Risks and benefits--CVA, MI, death explained.  ASA, Effient, Hep IV, NTG.   BRADYCARDIA - hold metoprolol.   HYPERLIPIDEMIA - continue atorva 40.     Fitzroy Mikami 06/02/2012, 11:15 AM

## 2012-06-02 NOTE — Care Management Note (Signed)
    Page 1 of 1   06/02/2012     9:57:29 AM   CARE MANAGEMENT NOTE 06/02/2012  Patient:  Gabriel Anderson, Gabriel Anderson   Account Number:  [id number]  Date Initiated:  06/02/2012  Documentation initiated by:  Junius Creamer  Subjective/Objective Assessment:   adm w ch pain     Action/Plan:   lives w wife   Anticipated DC Date:     Anticipated DC Plan:  HOME/SELF CARE      DC Planning Services  CM consult      Choice offered to / List presented to:             Status of service:   Medicare Important Message given?   (If response is "NO", the following Medicare IM given date fields will be blank) Date Medicare IM given:   Date Additional Medicare IM given:    Discharge Disposition:  HOME/SELF CARE  Per UR Regulation:  Reviewed for med. necessity/level of care/duration of stay  If discussed at Long Length of Stay Meetings, dates discussed:    Comments:  9/25 8:36a debbie Aiyla Baucom rn,bsn [phone] pt has used effient free 30days card. he has ins for his effient and was on this pta.

## 2012-06-02 NOTE — Progress Notes (Signed)
ANTICOAGULATION CONSULT NOTE - Initial Consult  Pharmacy Consult for UFH Indication: NSTEMI  No Known Allergies  Patient Measurements: Height: 5' 10.08" (178 cm) Weight: 219 lb 5.7 oz (99.5 kg) IBW/kg (Calculated) : 73.18  Heparin Dosing Weight: 93kg  Vital Signs: Temp: 97.8 F (36.6 C) (09/25 0357) Temp src: Oral (09/25 0357) BP: 123/78 mmHg (09/25 0045) Pulse Rate: 48  (09/25 0045)  Labs:  Basename 06/02/12 0518 06/01/12 2342 06/01/12 2140  HGB 13.0 12.9* --  HCT 39.3 37.9* 36.7*  PLT 266 275 265  APTT -- 34 --  LABPROT -- 14.0 13.9  INR -- 1.09 1.08  HEPARINUNFRC 0.43 -- --  CREATININE 0.97 0.90 0.98  CKTOTAL -- -- --  CKMB -- -- --  TROPONINI 3.10* <0.30 <0.30    Estimated Creatinine Clearance: 116.3 ml/min (by C-G formula based on Cr of 0.97).   Medical History: Past Medical History  Diagnosis Date  . Coronary atherosclerosis of native coronary artery 05/21/2012    Cardiac catheterization 05/21/12-95% mid LAD lesion at the bifurcation of a large diagonal branch. Normal ejection fraction.  . Hypertension     Medications:  Scheduled:     . aspirin  81 mg Oral Daily  . atorvastatin  40 mg Oral q1800  . heparin  4,000 Units Intravenous Once  . metoprolol succinate  25 mg Oral Daily  . prasugrel  10 mg Oral Daily  . sodium chloride  1,000 mL Intravenous Once  . DISCONTD: Glucosamine HCl  1,000 mg Oral BID    Assessment: 41 y/o male patient with significant cardiac history admitted with chest pain requiring anticoagulation. Now with positive troponin. First heparin level is therapeutic, no reports of bleeding.  Goal of Therapy:  Heparin level 0.3-0.7 units/ml Monitor platelets by anticoagulation protocol: Yes   Plan:  Continue heparin gtt at 1200 units/hr and confirm level in 6 hours.  Verlene Mayer, PharmD, BCPS Pager [phone] 06/02/2012,6:34 AM

## 2012-06-03 ENCOUNTER — Encounter (HOSPITAL_COMMUNITY): Payer: Self-pay | Admitting: Cardiology

## 2012-06-03 ENCOUNTER — Ambulatory Visit (HOSPITAL_COMMUNITY): Payer: Managed Care, Other (non HMO)

## 2012-06-03 LAB — CBC
MCV: 89.3 fL (ref 78.0–100.0)
Platelets: 262 10*3/uL (ref 150–400)
RBC: 4.3 MIL/uL (ref 4.22–5.81)
WBC: 6.9 10*3/uL (ref 4.0–10.5)

## 2012-06-03 MED ORDER — ISOSORBIDE MONONITRATE ER 30 MG PO TB24: 30.0000 mg | ORAL_TABLET | Freq: Every day | ORAL | Status: DC

## 2012-06-03 NOTE — Plan of Care (Signed)
Problem: Consults Goal: MI Patient Education (See Patient Education module for education specifics.)  Outcome: Completed/Met Date Met:  06/03/12 Discussed MI at length with patient and wife, including diet, exercise, stress, meds, follow up.  Pt and wife both eager for information and already eating heart healthy mostly vegetable diet.  Does not use tobacco.  Post radial cath instruction sheet and Bouncing Back MI book given.  Viewed MI video.  Questions answered.

## 2012-06-03 NOTE — Progress Notes (Signed)
CARDIAC REHAB PHASE I   PRE:  Rate/Rhythm: 48SB  BP:  Supine: 105/64  Sitting:   Standing:    SaO2: 98%RA  MODE:  Ambulation: 700 ft   POST:  Rate/Rhythem: 73SR  BP:  Supine: 132/76  Sitting:   Standing:    SaO2:  0981-1914 Notified by pt's RN that pt here with MI. Put in order for [place] to see. We saw pt on admission few weeks ago. Pt and wife had questions about exercise, NTG use, and general information. Reviewed MI restrictions. Notified CRP 2 that pt here as he was to go to orientation this am. Pt and wife are very motivated about risk factor modification and have made diet changes. Pt walked 700 ft with steady gait.tolerated well. No CP.  Duanne Limerick

## 2012-06-03 NOTE — Discharge Summary (Signed)
Patient ID: Gabriel Anderson MRN: [id number] DOB/AGE: May 17, 1971 41 y.o.  Admit date: 06/01/2012 Discharge date: 06/03/2012  Primary Discharge Diagnosis: Non-ST elevation myocardial infarction, troponin 3.  Secondary Discharge Diagnosis: Coronary artery disease-prior stent placement x2 to mid LAD, hyperlipidemia, anxiety, bradycardia  Significant Diagnostic Studies: Cardiac catheterization 2012-06-23-widely patent previously placed LAD stents, jailed diagonal with improved flow, diseased septal arcade, normal EF.   Hospital Course: 41 year old male with coronary artery disease status post 2 drug eluting stent placement to mid LAD in mid September who returned to the hospital with substernal chest discomfort/pressure that was concerning to him. First 2 troponins were normal and the third demonstrated an abnormal value of 3. A subsequent set was drawn which showed troponin of 2.37, MB of 8.1. He was taken to the cardiac catheterization lab to evaluate his previously placed stents. Unremarkable catheterization. Chest pain resolved.  Throughout evening post catheterization he did complain of some right shoulder discomfort. He has had this in the past. After the last radial artery catheterization he had some discomfort running up his arm/shoulder. He did not experience any vasospasm during the procedure.  Currently he is doing well. Overnight on telemetry his heart rate was as low as 41 beats per minute and is currently 48 beats per minute with metoprolol XL 50 mg on hold. I will hold this medication for the time being. He did have previous description of PVCs. We will monitor.  I started low-dose isosorbide 30 mg once a day for him. Continuing with atorvastatin.   Discharge Exam: Blood pressure 101/61, pulse 46, temperature 97.4 F (36.3 C), temperature source Oral, resp. rate 11, height 5' 10.08" (1.78 m), weight 99.5 kg (219 lb 5.7 oz), SpO2 99.00%.   General appearance: alert and  cooperative Cardio: regular rate and rhythm, S1, S2 normal, no murmur, click, rub or gallop Radial artery site clean dry and intact. Neurologically intact.  Labs:   Lab Results  Component Value Date   WBC 6.9 06/03/2012   HGB 12.8* 06/03/2012   HCT 38.4* 06/03/2012   MCV 89.3 06/03/2012   PLT 262 06/03/2012    Lab 23-Jun-2012 0518 06/01/12 2342  NA 142 --  K 4.0 --  CL 109 --  CO2 28 --  BUN 7 --  CREATININE 0.97 --  CALCIUM 9.2 --  PROT -- 6.0  BILITOT -- 0.2*  ALKPHOS -- 59  ALT -- 19  AST -- 18  GLUCOSE 101* --   Lab Results  Component Value Date   CKTOTAL 171 2012/06/23   CKMB 8.1* 06/23/2012   TROPONINI 2.37* 06-23-2012    No results found for this basename: CHOL   No results found for this basename: HDL   No results found for this basename: LDLCALC   No results found for this basename: TRIG   No results found for this basename: CHOLHDL   No results found for this basename: LDLDIRECT       FOLLOW UP PLANS AND APPOINTMENTS Discharge Orders    Future Appointments: Provider: Department: Dept Phone: Center:   06/03/2012 8:00 AM [facility] [phone] None   06/07/2012 9:45 AM [facility] [phone] None   06/09/2012 9:45 AM [facility] [phone] None   06/11/2012 9:45 AM [facility] [phone] None   06/14/2012 9:45 AM [facility] [phone] None   06/16/2012 9:45 AM [facility] [phone] None   06/18/2012 9:45 AM [facility]  [facility] [phone] None   06/21/2012 9:45 AM [facility] [phone] None   06/23/2012 9:45 AM [facility] [phone] None   06/25/2012 9:45 AM [facility] [phone] None   06/28/2012 9:45 AM [facility] [phone] None   06/30/2012 9:45 AM  [facility] [phone] None   07/02/2012 9:45 AM [facility] [phone] None   07/05/2012 9:45 AM [facility] [phone] None   07/07/2012 9:45 AM [facility] [phone] None   07/09/2012 9:45 AM [facility] [phone] None   07/12/2012 9:45 AM [facility] [phone] None   07/14/2012 9:45 AM [facility] [phone] None   07/16/2012 9:45 AM [facility] [phone] None   07/19/2012 9:45 AM [facility] [phone] None   07/21/2012 9:45 AM [facility] [phone] None   07/23/2012 9:45 AM [facility] [phone] None   07/26/2012 9:45 AM [facility] [phone] None   07/28/2012 9:45 AM [facility] [phone] None   07/30/2012 9:45 AM [facility] [phone] None   08/02/2012 9:45 AM [facility] [phone] None   08/04/2012 9:45 AM [facility] [phone] None   08/09/2012 9:45 AM [facility] [phone] None   08/11/2012 9:45 AM [facility] [phone] None   08/13/2012 9:45 AM [facility] [phone] None   08/16/2012 9:45 AM [facility] [phone] None   08/18/2012 9:45 AM [facility] [phone] None   08/20/2012 9:45 AM [facility] [phone] None   08/23/2012 9:45 AM [facility] [phone] None   08/25/2012 9:45 AM [facility] [phone] None   08/27/2012 9:45 AM [facility] [phone] None   08/30/2012 9:45 AM  [facility] [phone] None   09/03/2012 9:45 AM [facility] [phone] None   09/06/2012 9:45 AM [facility] [phone] None   09/10/2012 9:45 AM [facility] [phone] None     Future Orders Please Complete By Expires   Diet - low sodium heart healthy      Increase activity slowly          Medication List     As of 06/03/2012  7:56 AM    STOP taking these medications         metoprolol succinate 50 MG 24 hr tablet   Commonly known as: TOPROL-XL      TAKE these medications         aspirin 81 MG chewable tablet   Chew 1 tablet (81 mg total) by mouth daily.      atorvastatin 40 MG tablet   Commonly known as: LIPITOR   Take 1 tablet (40 mg total) by mouth daily at 6 PM.      Glucosamine HCl 1000 MG Tabs   Take 1,000 mg by mouth 2 (two) times daily.      isosorbide mononitrate 30 MG 24 hr tablet   Commonly known as: IMDUR   Take 1 tablet (30 mg total) by mouth daily.      nitroGLYCERIN 0.4 MG SL tablet  Commonly known as: NITROSTAT   Place 0.4 mg under the tongue every 5 (five) minutes as needed. For chest pain      prasugrel 10 MG Tabs   Commonly known as: EFFIENT   Take 1 tablet (10 mg total) by mouth daily.           Follow-up Information    Follow up with Donato Schultz, MD. On 06/11/2012. (8:45am)    Contact information:   [address] [facility] [address] [phone]         He knows to contact us if any further worrisome symptoms develop.  BRING ALL MEDICATIONS WITH YOU TO FOLLOW UP APPOINTMENTS  Time spent with patient to include physician time: 25 minutes spent on discharge, patient education, appointment, medicine reconciliation. SignedDonato Schultz 06/03/2012, 7:56 AM

## 2012-06-03 NOTE — Plan of Care (Signed)
Problem: Phase II Progression Outcomes Goal: Cardiac Rehab if ordered Outcome: Completed/Met Date Met:  06/03/12 Seen by cardiac rehab, positive for NSTEMI

## 2012-06-03 NOTE — Plan of Care (Signed)
Problem: Phase II Progression Outcomes Goal: Cardiac Rehab referral Outcome: Completed/Met Date Met:  06/03/12 Patient already seen by cardiac rehab last admission and referred  Problem: Discharge Progression Outcomes Goal: Discharge plan in place and appropriate Outcome: Completed/Met Date Met:  06/03/12 Patient discharge home today to residence with wife and children Goal: Anginal pain absent Outcome: Completed/Met Date Met:  06/03/12 No complaints of angina since cath yesterday Goal: Hemodynamically stable Outcome: Completed/Met Date Met:  06/03/12 Blood pressure 106/61. Sinus bradycardia in the 40's to 55 for a rate, Dr. Anne Fu aware. O2 sats 98-100% on room air. Respiratory rate 12-17 Goal: Tolerates diet Outcome: Completed/Met Date Met:  06/03/12 Tolerating meals yesterday and today Goal: Activity plan per MD/Cardiac Rehab Outcome: Completed/Met Date Met:  06/03/12 Activity to resume and will follow up with cardiac rehab outpatient for further instruction Goal: Lipid-lowering therapy prescribed Outcome: Completed/Met Date Met:  06/03/12 On lipid lowering agent, lipitor

## 2012-06-07 ENCOUNTER — Inpatient Hospital Stay (HOSPITAL_COMMUNITY): Admission: RE | Admit: 2012-06-07 | Payer: Managed Care, Other (non HMO) | Source: Ambulatory Visit

## 2012-06-09 ENCOUNTER — Ambulatory Visit (HOSPITAL_COMMUNITY): Payer: Managed Care, Other (non HMO)

## 2012-06-11 ENCOUNTER — Ambulatory Visit (HOSPITAL_COMMUNITY): Payer: Managed Care, Other (non HMO)

## 2012-06-14 ENCOUNTER — Ambulatory Visit (HOSPITAL_COMMUNITY): Payer: Managed Care, Other (non HMO)

## 2012-06-16 ENCOUNTER — Ambulatory Visit (HOSPITAL_COMMUNITY): Payer: Managed Care, Other (non HMO)

## 2012-06-17 ENCOUNTER — Encounter (HOSPITAL_COMMUNITY)
Admission: RE | Admit: 2012-06-17 | Discharge: 2012-06-17 | Disposition: A | Discharge status: Home / Self Care | Payer: Managed Care, Other (non HMO) | Source: Ambulatory Visit | Attending: Cardiology | Admitting: Cardiology

## 2012-06-17 DIAGNOSIS — I498 Other specified cardiac arrhythmias: Secondary | ICD-10-CM | POA: Insufficient documentation

## 2012-06-17 DIAGNOSIS — I214 Non-ST elevation (NSTEMI) myocardial infarction: Secondary | ICD-10-CM | POA: Insufficient documentation

## 2012-06-17 DIAGNOSIS — Z7982 Long term (current) use of aspirin: Secondary | ICD-10-CM | POA: Insufficient documentation

## 2012-06-17 DIAGNOSIS — I10 Essential (primary) hypertension: Secondary | ICD-10-CM | POA: Insufficient documentation

## 2012-06-17 DIAGNOSIS — Z5189 Encounter for other specified aftercare: Secondary | ICD-10-CM | POA: Insufficient documentation

## 2012-06-17 DIAGNOSIS — E785 Hyperlipidemia, unspecified: Secondary | ICD-10-CM | POA: Insufficient documentation

## 2012-06-17 DIAGNOSIS — Z9861 Coronary angioplasty status: Secondary | ICD-10-CM | POA: Insufficient documentation

## 2012-06-17 DIAGNOSIS — I2510 Atherosclerotic heart disease of native coronary artery without angina pectoris: Secondary | ICD-10-CM | POA: Insufficient documentation

## 2012-06-17 DIAGNOSIS — F411 Generalized anxiety disorder: Secondary | ICD-10-CM | POA: Insufficient documentation

## 2012-06-17 NOTE — Progress Notes (Signed)
Cardiac Rehab Medication Review by a Pharmacist  Does the patient  feel that his/her medications are working for him/her?  yes  Has the patient been experiencing any side effects to the medications prescribed?  no  Does the patient measure his/her own blood pressure or blood glucose at home?  no   Does the patient have any problems obtaining medications due to transportation or finances?   no  Understanding of regimen: excellent Understanding of indications: excellent Potential of compliance: excellent    Pharmacist comments: Patient has good understanding of his medications and no side effects currently. Patient's medication list was reviewed and patient found to have excellent compliance.    Gabriel Anderson 06/17/2012 9:31 AM

## 2012-06-18 ENCOUNTER — Ambulatory Visit (HOSPITAL_COMMUNITY): Payer: Managed Care, Other (non HMO)

## 2012-06-21 ENCOUNTER — Encounter (HOSPITAL_COMMUNITY)
Admission: RE | Admit: 2012-06-21 | Discharge: 2012-06-21 | Disposition: A | Discharge status: Home / Self Care | Payer: Managed Care, Other (non HMO) | Source: Ambulatory Visit | Attending: Cardiology | Admitting: Cardiology

## 2012-06-21 ENCOUNTER — Ambulatory Visit (HOSPITAL_COMMUNITY): Payer: Managed Care, Other (non HMO)

## 2012-06-22 NOTE — Progress Notes (Signed)
Pt started cardiac rehab today.  Telemetry rhythm Sinus.  Vital signs stable.  Gabriel Anderson reported having a brief episode of chest pressure on the treadmill.  Gabriel Anderson rated the pressure a 1 on a 1-10 scaleBlood pressure 124/78. Gabriel Anderson mentioned that he has taken a sublingual nitroglycerin for the past three days for the same type of pressure.  Exercise stopped.  Dr Anne Fu office called and notified.  Faxed exercise flow sheet with today's ECG tracing for review. Gabriel Anderson was advised to come to Dr Anne Fu office for evaluation. Gabriel Anderson did not have any chest discomfort upon discharge from cardiac rehab.

## 2012-06-23 ENCOUNTER — Ambulatory Visit (HOSPITAL_COMMUNITY): Payer: Managed Care, Other (non HMO)

## 2012-06-23 ENCOUNTER — Encounter (HOSPITAL_COMMUNITY)
Admission: RE | Admit: 2012-06-23 | Discharge: 2012-06-23 | Disposition: A | Discharge status: Home / Self Care | Payer: Managed Care, Other (non HMO) | Source: Ambulatory Visit | Attending: Cardiology | Admitting: Cardiology

## 2012-06-23 NOTE — Progress Notes (Signed)
[name] returned to exercise today per Dr Anne Fu. No complaints or chest pain noted today.Will continue to monitor the patient throughout  the program.

## 2012-06-25 ENCOUNTER — Encounter (HOSPITAL_COMMUNITY)
Admission: RE | Admit: 2012-06-25 | Discharge: 2012-06-25 | Disposition: A | Discharge status: Home / Self Care | Payer: Managed Care, Other (non HMO) | Source: Ambulatory Visit | Attending: Cardiology | Admitting: Cardiology

## 2012-06-25 ENCOUNTER — Ambulatory Visit (HOSPITAL_COMMUNITY): Payer: Managed Care, Other (non HMO)

## 2012-06-28 ENCOUNTER — Encounter (HOSPITAL_COMMUNITY)
Admission: RE | Admit: 2012-06-28 | Discharge: 2012-06-28 | Disposition: A | Discharge status: Home / Self Care | Payer: Managed Care, Other (non HMO) | Source: Ambulatory Visit | Attending: Cardiology | Admitting: Cardiology

## 2012-06-28 ENCOUNTER — Ambulatory Visit (HOSPITAL_COMMUNITY): Payer: Managed Care, Other (non HMO)

## 2012-06-30 ENCOUNTER — Ambulatory Visit (HOSPITAL_COMMUNITY): Payer: Managed Care, Other (non HMO)

## 2012-06-30 ENCOUNTER — Encounter (HOSPITAL_COMMUNITY)
Admission: RE | Admit: 2012-06-30 | Discharge: 2012-06-30 | Disposition: A | Discharge status: Home / Self Care | Payer: Managed Care, Other (non HMO) | Source: Ambulatory Visit | Attending: Cardiology | Admitting: Cardiology

## 2012-06-30 NOTE — Progress Notes (Signed)
Reviewed home exercise with pt today.  Pt plans to walk and attend [employer] for exercise.  Reviewed THR, pulse, RPE, sign and symptoms, NTG use, and when to call 911 or MD.  Pt voiced understanding. Fabio Pierce, MA, ACSM RCEP

## 2012-07-02 ENCOUNTER — Encounter (HOSPITAL_COMMUNITY)
Admission: RE | Admit: 2012-07-02 | Discharge: 2012-07-02 | Disposition: A | Discharge status: Home / Self Care | Payer: Managed Care, Other (non HMO) | Source: Ambulatory Visit | Attending: Cardiology | Admitting: Cardiology

## 2012-07-02 ENCOUNTER — Ambulatory Visit (HOSPITAL_COMMUNITY): Payer: Managed Care, Other (non HMO)

## 2012-07-05 ENCOUNTER — Ambulatory Visit (HOSPITAL_COMMUNITY): Payer: Managed Care, Other (non HMO)

## 2012-07-05 ENCOUNTER — Encounter (HOSPITAL_COMMUNITY)
Admission: RE | Admit: 2012-07-05 | Discharge: 2012-07-05 | Disposition: A | Discharge status: Home / Self Care | Payer: Managed Care, Other (non HMO) | Source: Ambulatory Visit | Attending: Cardiology | Admitting: Cardiology

## 2012-07-05 NOTE — Progress Notes (Signed)
Gabriel Anderson said Dr Anne Fu  Increased his Imdur to 30 mg once a day last Wednesday.  Gabriel Anderson has not had any further complaints of chest pain at cardiac rehab. Will continue to monitor the patient throughout  the program.

## 2012-07-05 NOTE — Progress Notes (Signed)
Gabriel Anderson 41 y.o. male Nutrition Note Spoke with pt.  Nutrition Plan, Nutrition Survey, and cholesterol goals reviewed with pt. Pt is following Step 2 of the Therapeutic Lifestyle Changes diet. Pt wants to lose wt. Pt has been trying to lose wt and improve bilateral hip arthritis by following the pH Miracle diet. Pt states he weighed 242# on 04/10/12. Pt's current wt is down 28.2# over the past 2 months. Per pt, "most of the wt loss occurred in the first month." Fad diets discussed. Healthy wt loss tips reviewed. Pt eating 11 servings of fruits and veggies daily. Most veggies are consumed in the form of a "green juice."Pt expressed understanding of the information reviewed. Pt aware of nutrition education classes offered.  Nutrition Diagnosis   Food-and nutrition-related knowledge deficit related to lack of exposure to information as related to diagnosis of: ? CVD   Obesity related to excessive energy intake as evidenced by a BMI of 30.7  Nutrition RX/ Estimated Daily Nutrition Needs for: wt loss  1800-2300 Kcal, 50-60 gm fat, 12-15 gm sat fat, 1.8-2.3 gm trans-fat, <1500 mg sodium   Nutrition Intervention   Pt's individual nutrition plan including cholesterol goals reviewed with pt.   Benefits of adopting Therapeutic Lifestyle Changes discussed when Medficts reviewed.   Pt to attend the Portion Distortion class   Pt to attend the  ? Nutrition I class                     ? Nutrition II class   Continue client-centered nutrition education by RD, as part of interdisciplinary care. Goal(s)   Pt to identify food quantities necessary to achieve: ? wt loss to a goal wt of 190-202 lb (86.3-91.7 kg) at graduation from cardiac rehab.    Pt to describe the benefit of including fruits, vegetables, whole grains, and low-fat dairy products in a heart healthy meal plan. Monitor and Evaluate progress toward nutrition goal with team. Nutrition Risk: Low

## 2012-07-07 ENCOUNTER — Ambulatory Visit (HOSPITAL_COMMUNITY): Payer: Managed Care, Other (non HMO)

## 2012-07-07 ENCOUNTER — Encounter (HOSPITAL_COMMUNITY)
Admission: RE | Admit: 2012-07-07 | Discharge: 2012-07-07 | Disposition: A | Discharge status: Home / Self Care | Payer: Managed Care, Other (non HMO) | Source: Ambulatory Visit | Attending: Cardiology | Admitting: Cardiology

## 2012-07-09 ENCOUNTER — Encounter (HOSPITAL_COMMUNITY)
Admission: RE | Admit: 2012-07-09 | Discharge: 2012-07-09 | Disposition: A | Discharge status: Home / Self Care | Payer: Managed Care, Other (non HMO) | Source: Ambulatory Visit | Attending: Cardiology | Admitting: Cardiology

## 2012-07-09 ENCOUNTER — Ambulatory Visit (HOSPITAL_COMMUNITY): Payer: Managed Care, Other (non HMO)

## 2012-07-09 DIAGNOSIS — I498 Other specified cardiac arrhythmias: Secondary | ICD-10-CM | POA: Insufficient documentation

## 2012-07-09 DIAGNOSIS — Z7982 Long term (current) use of aspirin: Secondary | ICD-10-CM | POA: Insufficient documentation

## 2012-07-09 DIAGNOSIS — F411 Generalized anxiety disorder: Secondary | ICD-10-CM | POA: Insufficient documentation

## 2012-07-09 DIAGNOSIS — Z5189 Encounter for other specified aftercare: Secondary | ICD-10-CM | POA: Insufficient documentation

## 2012-07-09 DIAGNOSIS — Z9861 Coronary angioplasty status: Secondary | ICD-10-CM | POA: Insufficient documentation

## 2012-07-09 DIAGNOSIS — I214 Non-ST elevation (NSTEMI) myocardial infarction: Secondary | ICD-10-CM | POA: Insufficient documentation

## 2012-07-09 DIAGNOSIS — E785 Hyperlipidemia, unspecified: Secondary | ICD-10-CM | POA: Insufficient documentation

## 2012-07-09 DIAGNOSIS — I10 Essential (primary) hypertension: Secondary | ICD-10-CM | POA: Insufficient documentation

## 2012-07-09 DIAGNOSIS — I2510 Atherosclerotic heart disease of native coronary artery without angina pectoris: Secondary | ICD-10-CM | POA: Insufficient documentation

## 2012-07-12 ENCOUNTER — Ambulatory Visit (HOSPITAL_COMMUNITY): Payer: Managed Care, Other (non HMO)

## 2012-07-12 ENCOUNTER — Encounter (HOSPITAL_COMMUNITY)
Admission: RE | Admit: 2012-07-12 | Discharge: 2012-07-12 | Disposition: A | Discharge status: Home / Self Care | Payer: Managed Care, Other (non HMO) | Source: Ambulatory Visit | Attending: Cardiology | Admitting: Cardiology

## 2012-07-14 ENCOUNTER — Ambulatory Visit (HOSPITAL_COMMUNITY): Payer: Managed Care, Other (non HMO)

## 2012-07-14 ENCOUNTER — Ambulatory Visit (HOSPITAL_COMMUNITY): Payer: No Typology Code available for payment source

## 2012-07-14 ENCOUNTER — Encounter (HOSPITAL_COMMUNITY)
Admission: RE | Admit: 2012-07-14 | Discharge: 2012-07-14 | Disposition: A | Discharge status: Home / Self Care | Payer: Managed Care, Other (non HMO) | Source: Ambulatory Visit | Attending: Cardiology | Admitting: Cardiology

## 2012-07-14 ENCOUNTER — Telehealth (HOSPITAL_COMMUNITY): Payer: Self-pay | Admitting: *Deleted

## 2012-07-14 ENCOUNTER — Ambulatory Visit (HOSPITAL_COMMUNITY)
Admission: RE | Admit: 2012-07-14 | Discharge: 2012-07-14 | Disposition: A | Discharge status: Home / Self Care | Payer: Managed Care, Other (non HMO) | Source: Ambulatory Visit | Attending: Cardiovascular Disease | Admitting: Cardiovascular Disease

## 2012-07-14 DIAGNOSIS — R0789 Other chest pain: Secondary | ICD-10-CM | POA: Insufficient documentation

## 2012-07-14 NOTE — Progress Notes (Signed)
Patient reports having chest pressure at a 1 out of a 10 level and some dizziness.  Patient placed in treatment room.  Had [name] to lie down.  Blood pressure 118/60 hear rate 64.  Patient was placed on 2l/min of oxygen and given water.  [name] said the discomfort went away and then returned.  Oxygen increased to 4l/min !2 lead ECg obtained.  Dr Anne Fu office called and notified of patients complaints. [name] said he experienced some chest pressure at home last night and took 2 sublingual nitroglycerin.  This information was not disclosed to me during exercise today.  11:20 chest pressure went away.  12 lead ECG , exercise flow sheets and ECG tracings from cardiac rehab faxed to Dr Anne Fu office for review. Sa02 98% on 2l. sa02 98% on room air. [name] has an appointment to see Cristopher Peru this afternoon at 1:45 this after noon.  [name] laft cardiac rehab without complaints or symptoms.  Oxygen discontinued.

## 2012-07-16 ENCOUNTER — Ambulatory Visit (HOSPITAL_COMMUNITY): Payer: Managed Care, Other (non HMO)

## 2012-07-16 ENCOUNTER — Encounter (HOSPITAL_COMMUNITY): Payer: Managed Care, Other (non HMO)

## 2012-07-19 ENCOUNTER — Ambulatory Visit (HOSPITAL_COMMUNITY): Payer: Managed Care, Other (non HMO)

## 2012-07-19 ENCOUNTER — Encounter (HOSPITAL_COMMUNITY)
Admission: RE | Admit: 2012-07-19 | Discharge: 2012-07-19 | Disposition: A | Discharge status: Home / Self Care | Payer: Managed Care, Other (non HMO) | Source: Ambulatory Visit | Attending: Cardiology | Admitting: Cardiology

## 2012-07-19 NOTE — Progress Notes (Signed)
Gabriel Anderson returned to exercise per Dr Anne Fu office and had no complaints of chest pain.  Gabriel Anderson says he feels much better since starting Ranexa.

## 2012-07-21 ENCOUNTER — Encounter (HOSPITAL_COMMUNITY)
Admission: RE | Admit: 2012-07-21 | Discharge: 2012-07-21 | Disposition: A | Discharge status: Home / Self Care | Payer: Managed Care, Other (non HMO) | Source: Ambulatory Visit | Attending: Cardiology | Admitting: Cardiology

## 2012-07-21 ENCOUNTER — Ambulatory Visit (HOSPITAL_COMMUNITY): Payer: Managed Care, Other (non HMO)

## 2012-07-23 ENCOUNTER — Ambulatory Visit (HOSPITAL_COMMUNITY): Payer: Managed Care, Other (non HMO)

## 2012-07-23 ENCOUNTER — Encounter (HOSPITAL_COMMUNITY): Payer: Managed Care, Other (non HMO)

## 2012-07-26 ENCOUNTER — Ambulatory Visit (HOSPITAL_COMMUNITY): Payer: Managed Care, Other (non HMO)

## 2012-07-26 ENCOUNTER — Encounter (HOSPITAL_COMMUNITY)
Admission: RE | Admit: 2012-07-26 | Discharge: 2012-07-26 | Disposition: A | Discharge status: Home / Self Care | Payer: Managed Care, Other (non HMO) | Source: Ambulatory Visit | Attending: Cardiology | Admitting: Cardiology

## 2012-07-27 NOTE — Progress Notes (Signed)
Gabriel Anderson's blood pressure was noted at 184/66 on the airdyne this morning.  Gabriel Anderson admits to pushing it.  Subsequent blood pressures improved.  Exit blood pressure 120/66. Will fax exercise flow sheets to Dr. Anne Fu office for review

## 2012-07-28 ENCOUNTER — Encounter (HOSPITAL_COMMUNITY)
Admission: RE | Admit: 2012-07-28 | Discharge: 2012-07-28 | Disposition: A | Discharge status: Home / Self Care | Payer: Managed Care, Other (non HMO) | Source: Ambulatory Visit | Attending: Cardiology | Admitting: Cardiology

## 2012-07-28 ENCOUNTER — Ambulatory Visit (HOSPITAL_COMMUNITY): Payer: Managed Care, Other (non HMO)

## 2012-07-30 ENCOUNTER — Ambulatory Visit (HOSPITAL_COMMUNITY): Payer: Managed Care, Other (non HMO)

## 2012-07-30 ENCOUNTER — Encounter (HOSPITAL_COMMUNITY)
Admission: RE | Admit: 2012-07-30 | Discharge: 2012-07-30 | Disposition: A | Discharge status: Home / Self Care | Payer: Managed Care, Other (non HMO) | Source: Ambulatory Visit | Attending: Cardiology | Admitting: Cardiology

## 2012-08-02 ENCOUNTER — Ambulatory Visit (HOSPITAL_COMMUNITY): Payer: Managed Care, Other (non HMO)

## 2012-08-02 ENCOUNTER — Encounter (HOSPITAL_COMMUNITY)
Admission: RE | Admit: 2012-08-02 | Discharge: 2012-08-02 | Disposition: A | Discharge status: Home / Self Care | Payer: Managed Care, Other (non HMO) | Source: Ambulatory Visit | Attending: Cardiology | Admitting: Cardiology

## 2012-08-04 ENCOUNTER — Ambulatory Visit (HOSPITAL_COMMUNITY): Payer: Managed Care, Other (non HMO)

## 2012-08-04 ENCOUNTER — Encounter (HOSPITAL_COMMUNITY): Admission: RE | Admit: 2012-08-04 | Payer: Managed Care, Other (non HMO) | Source: Ambulatory Visit

## 2012-08-09 ENCOUNTER — Encounter (HOSPITAL_COMMUNITY)
Admission: RE | Admit: 2012-08-09 | Discharge: 2012-08-09 | Disposition: A | Discharge status: Home / Self Care | Payer: Managed Care, Other (non HMO) | Source: Ambulatory Visit | Attending: Cardiology | Admitting: Cardiology

## 2012-08-09 ENCOUNTER — Ambulatory Visit (HOSPITAL_COMMUNITY): Payer: Managed Care, Other (non HMO)

## 2012-08-09 DIAGNOSIS — Z7982 Long term (current) use of aspirin: Secondary | ICD-10-CM | POA: Insufficient documentation

## 2012-08-09 DIAGNOSIS — I498 Other specified cardiac arrhythmias: Secondary | ICD-10-CM | POA: Insufficient documentation

## 2012-08-09 DIAGNOSIS — Z5189 Encounter for other specified aftercare: Secondary | ICD-10-CM | POA: Insufficient documentation

## 2012-08-09 DIAGNOSIS — I214 Non-ST elevation (NSTEMI) myocardial infarction: Secondary | ICD-10-CM | POA: Insufficient documentation

## 2012-08-09 DIAGNOSIS — F411 Generalized anxiety disorder: Secondary | ICD-10-CM | POA: Insufficient documentation

## 2012-08-09 DIAGNOSIS — I2510 Atherosclerotic heart disease of native coronary artery without angina pectoris: Secondary | ICD-10-CM | POA: Insufficient documentation

## 2012-08-09 DIAGNOSIS — E785 Hyperlipidemia, unspecified: Secondary | ICD-10-CM | POA: Insufficient documentation

## 2012-08-09 DIAGNOSIS — Z9861 Coronary angioplasty status: Secondary | ICD-10-CM | POA: Insufficient documentation

## 2012-08-09 DIAGNOSIS — I10 Essential (primary) hypertension: Secondary | ICD-10-CM | POA: Insufficient documentation

## 2012-08-11 ENCOUNTER — Ambulatory Visit (HOSPITAL_COMMUNITY): Payer: Managed Care, Other (non HMO)

## 2012-08-11 ENCOUNTER — Encounter (HOSPITAL_COMMUNITY)
Admission: RE | Admit: 2012-08-11 | Discharge: 2012-08-11 | Disposition: A | Discharge status: Home / Self Care | Payer: Managed Care, Other (non HMO) | Source: Ambulatory Visit | Attending: Cardiology | Admitting: Cardiology

## 2012-08-12 NOTE — Progress Notes (Signed)
Blood pressure was noted at 180/80 on the airdyne patient switched to the track.  Repeat blood pressure 112/80.  Patient completed exercise without further blood pressure elevations. Exit blood pressure 112/64. Will fax exercise flow sheets to Dr. Anne Fu office for review.

## 2012-08-13 ENCOUNTER — Ambulatory Visit (HOSPITAL_COMMUNITY): Payer: Managed Care, Other (non HMO)

## 2012-08-13 ENCOUNTER — Encounter (HOSPITAL_COMMUNITY)
Admission: RE | Admit: 2012-08-13 | Discharge: 2012-08-13 | Disposition: A | Discharge status: Home / Self Care | Payer: Managed Care, Other (non HMO) | Source: Ambulatory Visit | Attending: Cardiology | Admitting: Cardiology

## 2012-08-16 ENCOUNTER — Encounter (HOSPITAL_COMMUNITY): Payer: Managed Care, Other (non HMO)

## 2012-08-16 ENCOUNTER — Ambulatory Visit (HOSPITAL_COMMUNITY): Payer: Managed Care, Other (non HMO)

## 2012-08-18 ENCOUNTER — Ambulatory Visit (HOSPITAL_COMMUNITY): Payer: Managed Care, Other (non HMO)

## 2012-08-18 ENCOUNTER — Encounter (HOSPITAL_COMMUNITY)
Admission: RE | Admit: 2012-08-18 | Discharge: 2012-08-18 | Disposition: A | Discharge status: Home / Self Care | Payer: Managed Care, Other (non HMO) | Source: Ambulatory Visit | Attending: Cardiology | Admitting: Cardiology

## 2012-08-18 NOTE — Progress Notes (Signed)
[name] reports having chest discomfort and Jaw discomfort at home.  Exercise already in progress.  Blood pressure 140/60 on the airdyne.  Blood pressure 190/ 96 on the treadmill.  Exercise stopped. Subsequent blood pressures improved.  Dr Anne Fu office notified of patients complaints. No chest pain reported today during exercise.  Burna Mortimer called and talked with Mr, Adduci over the phone.  [name] left cardiac rehab to go to see Dr Anne Fu.  Today's ECG tracing faxed over to Dr Anne Fu office for review.

## 2012-08-20 ENCOUNTER — Ambulatory Visit (HOSPITAL_COMMUNITY): Payer: Managed Care, Other (non HMO)

## 2012-08-20 ENCOUNTER — Encounter (HOSPITAL_COMMUNITY): Payer: Managed Care, Other (non HMO)

## 2012-08-23 ENCOUNTER — Encounter (HOSPITAL_COMMUNITY): Payer: Managed Care, Other (non HMO)

## 2012-08-23 ENCOUNTER — Ambulatory Visit (HOSPITAL_COMMUNITY): Payer: Managed Care, Other (non HMO)

## 2012-08-25 ENCOUNTER — Encounter (HOSPITAL_COMMUNITY)
Admission: RE | Admit: 2012-08-25 | Discharge: 2012-08-25 | Disposition: A | Discharge status: Home / Self Care | Payer: Managed Care, Other (non HMO) | Source: Ambulatory Visit | Attending: Cardiology | Admitting: Cardiology

## 2012-08-25 ENCOUNTER — Ambulatory Visit (HOSPITAL_COMMUNITY): Payer: Managed Care, Other (non HMO)

## 2012-08-25 NOTE — Progress Notes (Signed)
[name] returned to exercise today per Dr Anne Fu.  No complaints of chest pain. Will continue to monitor the patient throughout  the program.

## 2012-08-27 ENCOUNTER — Ambulatory Visit (HOSPITAL_COMMUNITY): Payer: Managed Care, Other (non HMO)

## 2012-08-27 ENCOUNTER — Encounter (HOSPITAL_COMMUNITY)
Admission: RE | Admit: 2012-08-27 | Discharge: 2012-08-27 | Disposition: A | Discharge status: Home / Self Care | Payer: Managed Care, Other (non HMO) | Source: Ambulatory Visit | Attending: Cardiology | Admitting: Cardiology

## 2012-08-30 ENCOUNTER — Encounter (HOSPITAL_COMMUNITY): Payer: Managed Care, Other (non HMO)

## 2012-08-30 ENCOUNTER — Ambulatory Visit (HOSPITAL_COMMUNITY): Payer: Managed Care, Other (non HMO)

## 2012-09-03 ENCOUNTER — Encounter (HOSPITAL_COMMUNITY): Payer: Managed Care, Other (non HMO)

## 2012-09-03 ENCOUNTER — Ambulatory Visit (HOSPITAL_COMMUNITY): Payer: Managed Care, Other (non HMO)

## 2012-09-06 ENCOUNTER — Encounter (HOSPITAL_COMMUNITY)
Admission: RE | Admit: 2012-09-06 | Discharge: 2012-09-06 | Disposition: A | Discharge status: Home / Self Care | Payer: Managed Care, Other (non HMO) | Source: Ambulatory Visit | Attending: Cardiology | Admitting: Cardiology

## 2012-09-06 ENCOUNTER — Ambulatory Visit (HOSPITAL_COMMUNITY): Payer: Managed Care, Other (non HMO)

## 2012-09-10 ENCOUNTER — Encounter (HOSPITAL_COMMUNITY): Payer: Managed Care, Other (non HMO)

## 2012-09-10 ENCOUNTER — Ambulatory Visit (HOSPITAL_COMMUNITY): Payer: Managed Care, Other (non HMO)

## 2012-09-13 ENCOUNTER — Telehealth (HOSPITAL_COMMUNITY): Payer: Self-pay | Admitting: *Deleted

## 2012-09-13 ENCOUNTER — Encounter (HOSPITAL_COMMUNITY): Admission: RE | Admit: 2012-09-13 | Payer: Managed Care, Other (non HMO) | Source: Ambulatory Visit

## 2012-09-15 ENCOUNTER — Encounter (HOSPITAL_COMMUNITY): Payer: Managed Care, Other (non HMO)

## 2012-09-17 ENCOUNTER — Encounter (HOSPITAL_COMMUNITY): Payer: Managed Care, Other (non HMO)

## 2012-09-20 ENCOUNTER — Encounter (HOSPITAL_COMMUNITY): Payer: Managed Care, Other (non HMO)

## 2012-09-22 ENCOUNTER — Encounter (HOSPITAL_COMMUNITY): Payer: Managed Care, Other (non HMO)

## 2012-09-24 ENCOUNTER — Encounter (HOSPITAL_COMMUNITY): Payer: Managed Care, Other (non HMO)

## 2012-09-27 ENCOUNTER — Encounter (HOSPITAL_COMMUNITY): Payer: Managed Care, Other (non HMO)

## 2012-09-29 ENCOUNTER — Encounter (HOSPITAL_COMMUNITY): Payer: Managed Care, Other (non HMO)

## 2012-10-01 ENCOUNTER — Encounter (HOSPITAL_COMMUNITY): Payer: Managed Care, Other (non HMO)

## 2012-10-04 ENCOUNTER — Encounter (HOSPITAL_COMMUNITY): Payer: Managed Care, Other (non HMO)

## 2012-10-06 ENCOUNTER — Encounter (HOSPITAL_COMMUNITY): Payer: Managed Care, Other (non HMO)

## 2012-10-08 ENCOUNTER — Encounter (HOSPITAL_COMMUNITY): Payer: Managed Care, Other (non HMO)

## 2012-10-11 ENCOUNTER — Encounter (HOSPITAL_COMMUNITY): Payer: Managed Care, Other (non HMO)

## 2012-10-13 ENCOUNTER — Encounter (HOSPITAL_COMMUNITY): Payer: Managed Care, Other (non HMO)

## 2012-10-15 ENCOUNTER — Encounter (HOSPITAL_COMMUNITY): Payer: Managed Care, Other (non HMO)

## 2012-10-18 ENCOUNTER — Encounter (HOSPITAL_COMMUNITY): Payer: Managed Care, Other (non HMO)

## 2012-10-20 ENCOUNTER — Encounter (HOSPITAL_COMMUNITY): Payer: Managed Care, Other (non HMO)

## 2012-10-22 ENCOUNTER — Encounter (HOSPITAL_COMMUNITY): Payer: Managed Care, Other (non HMO)

## 2013-06-08 ENCOUNTER — Other Ambulatory Visit: Payer: Managed Care, Other (non HMO)

## 2013-06-30 ENCOUNTER — Other Ambulatory Visit: Payer: Self-pay

## 2013-06-30 MED ORDER — ISOSORBIDE MONONITRATE ER 30 MG PO TB24: 30.0000 mg | ORAL_TABLET | Freq: Every day | ORAL | Status: DC

## 2013-08-25 ENCOUNTER — Ambulatory Visit: Payer: Managed Care, Other (non HMO) | Admitting: Cardiology

## 2013-09-06 ENCOUNTER — Encounter: Payer: Self-pay | Admitting: Cardiology

## 2013-09-06 DIAGNOSIS — I10 Essential (primary) hypertension: Secondary | ICD-10-CM | POA: Insufficient documentation

## 2013-09-06 DIAGNOSIS — E785 Hyperlipidemia, unspecified: Secondary | ICD-10-CM | POA: Insufficient documentation

## 2013-09-19 ENCOUNTER — Encounter (INDEPENDENT_AMBULATORY_CARE_PROVIDER_SITE_OTHER): Payer: Self-pay

## 2013-09-19 ENCOUNTER — Ambulatory Visit (INDEPENDENT_AMBULATORY_CARE_PROVIDER_SITE_OTHER): Payer: BC Managed Care – PPO | Admitting: Cardiology

## 2013-09-19 ENCOUNTER — Encounter: Payer: Self-pay | Admitting: Cardiology

## 2013-09-19 VITALS — BP 146/86 | HR 65 | Ht 70.0 in | Wt 238.0 lb

## 2013-09-19 DIAGNOSIS — I2510 Atherosclerotic heart disease of native coronary artery without angina pectoris: Secondary | ICD-10-CM

## 2013-09-19 DIAGNOSIS — I10 Essential (primary) hypertension: Secondary | ICD-10-CM

## 2013-09-19 DIAGNOSIS — I252 Old myocardial infarction: Secondary | ICD-10-CM

## 2013-09-19 DIAGNOSIS — E785 Hyperlipidemia, unspecified: Secondary | ICD-10-CM

## 2013-09-19 DIAGNOSIS — I214 Non-ST elevation (NSTEMI) myocardial infarction: Secondary | ICD-10-CM

## 2013-09-19 DIAGNOSIS — I208 Other forms of angina pectoris: Secondary | ICD-10-CM

## 2013-09-19 LAB — COMPREHENSIVE METABOLIC PANEL
ALK PHOS: 58 U/L (ref 39–117)
ALT: 25 U/L (ref 0–53)
AST: 20 U/L (ref 0–37)
Albumin: 4.1 g/dL (ref 3.5–5.2)
BILIRUBIN TOTAL: 0.5 mg/dL (ref 0.3–1.2)
BUN: 16 mg/dL (ref 6–23)
CO2: 30 mEq/L (ref 19–32)
Calcium: 9 mg/dL (ref 8.4–10.5)
Chloride: 103 mEq/L (ref 96–112)
Creatinine, Ser: 1 mg/dL (ref 0.4–1.5)
GFR: 85.94 mL/min (ref >60.00)
GLUCOSE: 100 mg/dL — AB (ref 70–99)
POTASSIUM: 4.5 meq/L (ref 3.5–5.1)
Sodium: 139 mEq/L (ref 135–145)
Total Protein: 6.9 g/dL (ref 6.0–8.3)

## 2013-09-19 LAB — CBC WITH DIFFERENTIAL/PLATELET
BASOS PCT: 0.5 % (ref 0.0–3.0)
Basophils Absolute: 0 10*3/uL (ref 0.0–0.1)
EOS ABS: 0.4 10*3/uL (ref 0.0–0.7)
EOS PCT: 4.6 % (ref 0.0–5.0)
HEMATOCRIT: 42.3 % (ref 39.0–52.0)
HEMOGLOBIN: 14.3 g/dL (ref 13.0–17.0)
LYMPHS ABS: 2.5 10*3/uL (ref 0.7–4.0)
Lymphocytes Relative: 32.1 % (ref 12.0–46.0)
MCHC: 33.8 g/dL (ref 30.0–36.0)
MCV: 87.9 fl (ref 78.0–100.0)
MONO ABS: 0.8 10*3/uL (ref 0.1–1.0)
Monocytes Relative: 10.3 % (ref 3.0–12.0)
NEUTROS ABS: 4.2 10*3/uL (ref 1.4–7.7)
NEUTROS PCT: 52.5 % (ref 43.0–77.0)
Platelets: 279 10*3/uL (ref 150.0–400.0)
RBC: 4.81 Mil/uL (ref 4.22–5.81)
RDW: 13.3 % (ref 11.5–14.6)
WBC: 7.9 10*3/uL (ref 4.5–10.5)

## 2013-09-19 LAB — LIPID PANEL
CHOL/HDL RATIO: 5
Cholesterol: 213 mg/dL — ABNORMAL HIGH (ref 0–200)
HDL: 39.7 mg/dL (ref >39.00)
TRIGLYCERIDES: 103 mg/dL (ref 0.0–149.0)
VLDL: 20.6 mg/dL (ref 0.0–40.0)

## 2013-09-19 LAB — LDL CHOLESTEROL, DIRECT: LDL DIRECT: 150 mg/dL

## 2013-09-19 MED ORDER — ATORVASTATIN CALCIUM 40 MG PO TABS: 40.0000 mg | ORAL_TABLET | Freq: Every day | ORAL | Status: DC

## 2013-09-19 NOTE — Patient Instructions (Signed)
Your physician has recommended you make the following change in your medication:   1. Start Atorvastatin 40 mg 1 tablet daily  Your physician recommends that you have lab work today: CMET, Lipid Panel, CBC  Your physician wants you to follow-up in: 6 months with Dr. Algis LimingSkains You will receive a reminder letter in the mail two months in advance. If you don't receive a letter, please call our office to schedule the follow-up appointment.

## 2013-09-19 NOTE — Progress Notes (Signed)
[address] [place], [place]  [address] Phone: [phone] Fax:  [phone]  Date:  09/19/2013   ID:  CLAUDY ABDALLAH, DOB [birth year], MRN [id number]  PCP:  Lillia Carmel, MD   History of Present Illness: Gabriel Anderson is a 43 y.o. male with hx of coronary artery disease ,status post DES to proximal LAD x2, on 05/21/12 with normal ejection fraction who was readmitted with chest pressure, with elevated troponin of 3. He was brought back to the catheterization lab and was noted to have patent stents as well as patent large diagonal branch which was jailed by the stent. He does have chronic diffuse disease in the septal branches that may have caused elevation in troponin and discomfort. He is on Isosorbide initially a half tablet due to headaches, which was returned to a full tablet on 06/29/12 due to periods of chest discomfort.  On 07/14/12 after recurrent chest pain he was started on Ranexa and started feeling better. He is being seen today per request of cardiac rehab after recurrent C/P while exercising. His BP went up to 190's. He states that when he first went on the Ranexa he felt so much better but now the episodes of angina are happening more frequently and are lasting longer. He has not taken any NTG, at times the mild chest discomfort may last up to 20 minutes, and radiate into jaw, occurs unrelated to exertion, not associated with other symptoms. NO GI complaints, he is not using the Zantac nor his lorazepam because he felt it has not helped his anxiety.   10/27/12 - Ranexa 1000mg  has helped out significantly. Feels better. Much less CP. Still has rare episodes, 2 times a week. Much better. Used NTG only 2 times. No SOB. Exercising daily. Wrestling with 2 boys.   6/14 - only on imdur now. Now off of Ranexa, cost. Has occasional CP, SSCP, no radiation, no SOB. Had to take 2 ntg. HA gone. 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 Discussed Lipitor. [name]  He  states he also has coldness and Raynaud type symptoms with his fingers.  09/19/13 - Weight increased. Bad diet. 243 at most. Up and down entire life. More Etoh. Has not taken Lipitor since 10/14 - no change in joint pain. Will go back on it. [name] wife. Imdur working well. Therapist has helped as well.     Wt Readings from Last 3 Encounters:  09/19/13 238 lb (107.956 kg)  06/17/12 203 lb 4.2 oz (92.2 kg)  06/01/12 219 lb 5.7 oz (99.5 kg)     Past Medical History  Diagnosis Date  . Coronary atherosclerosis of native coronary artery 05/21/2012    Cardiac catheterization 05/21/12-95% mid LAD lesion at the bifurcation of a large diagonal branch. Normal ejection fraction.  . Hypertension   . Acute myocardial infarction, subendocardial infarction, initial episode of care     Cardiac catheterization 06/02/12-widely patent previously placed LAD stents. Troponin peaked 3. Normal EF.  [name] Raynaud's disease   . Hyperlipidemia   . Anxiety     Past Surgical History  Procedure Laterality Date  . Cardiac catheterization  05/2012    PCI LAD at takeoff of large diagonal  . Rotator cuff repair    . Elbow surgery Left     Current Outpatient Prescriptions  Medication Sig Dispense Refill  . aspirin 81 MG tablet Take 81 mg by mouth daily.      . isosorbide mononitrate (IMDUR) 30 MG  24 hr tablet Take 1 tablet (30 mg total) by mouth daily.  90 tablet  3  . nitroGLYCERIN (NITROSTAT) 0.4 MG SL tablet Place 0.4 mg under the tongue every 5 (five) minutes as needed. For chest pain      . atorvastatin (LIPITOR) 40 MG tablet Take 1 tablet (40 mg total) by mouth daily at 6 PM.  30 tablet  12   No current facility-administered medications for this visit.    Allergies:   No Known Allergies  Social History:  The patient  reports that he has never smoked. He does not have any smokeless tobacco history on file. He reports that he drinks alcohol. He reports that he uses illicit drugs (Marijuana).   ROS:  Please see  the history of present illness.   Denies any bleeding, syncope, orthopnea, PND . +Hip arthritis.   PHYSICAL EXAM: VS:  BP 146/86  Pulse 65  Ht 5\' 10"  (1.778 m)  Wt 238 lb (107.956 kg)  BMI 34.15 kg/m2 Well nourished, well developed, in no acute distress HEENT: normal Neck: no JVD Cardiac:  normal S1, S2; RRR; no murmur Lungs:  clear to auscultation bilaterally, no wheezing, rhonchi or rales Abd: soft, nontender, no hepatomegaly Ext: no edema, spoon nails Skin: warm and dry Neuro: no focal abnormalities noted  EKG:  Sinus rhythm rate 65 with no other abnormalities    ASSESSMENT AND PLAN:  1. Coronary artery disease-stable, no worsening anginal symptoms. Continuing with isosorbide. Exercise. Motivated for weight loss. LAD stents. 2. Obesity-motivated to lose weight. Alcohol decrease. We discussed at length. 3. Hypertension-mildly elevated today. Continue to monitor. 4. Hyperlipidemia-he has been off of his atorvastatin since October of 2014. He has noted no change in his joint or muscle discomfort. We will go head and restart. Check lipid panel today. We will check liver functions as well as kidney function as well. Check hemoglobin. 5. Fingernail abnormality-checking blood work. Question anemia. 6. We will see back in 6 months   Signed, Gabriel SchultzMark Michelina Mexicano, MD [facility]  09/19/2013 10:29 AM

## 2013-10-06 ENCOUNTER — Other Ambulatory Visit: Payer: Self-pay | Admitting: *Deleted

## 2013-10-06 MED ORDER — ISOSORBIDE MONONITRATE ER 30 MG PO TB24: 30.0000 mg | ORAL_TABLET | Freq: Every day | ORAL | Status: DC

## 2013-10-06 MED ORDER — ATORVASTATIN CALCIUM 40 MG PO TABS: 40.0000 mg | ORAL_TABLET | Freq: Every day | ORAL | Status: DC

## 2013-10-06 MED ORDER — NITROGLYCERIN 0.4 MG SL SUBL: 0.4000 mg | SUBLINGUAL_TABLET | SUBLINGUAL | Status: DC | PRN

## 2013-10-06 NOTE — Telephone Encounter (Signed)
Patient wants scripts transferred to [facility], wife is working there

## 2013-12-14 ENCOUNTER — Other Ambulatory Visit (HOSPITAL_COMMUNITY): Payer: Self-pay | Admitting: Orthopaedic Surgery

## 2013-12-16 ENCOUNTER — Encounter (HOSPITAL_COMMUNITY): Payer: Self-pay | Admitting: Pharmacy Technician

## 2013-12-22 ENCOUNTER — Other Ambulatory Visit (HOSPITAL_COMMUNITY): Payer: Self-pay | Admitting: Orthopaedic Surgery

## 2013-12-22 NOTE — Progress Notes (Signed)
ekg 09-19-13 epic lov dr Anne Fuskains cardiology 09-19-13 epic

## 2013-12-22 NOTE — Patient Instructions (Addendum)
20 Alroy DustJeffrey T Primo  12/22/2013   Your procedure is scheduled on:  Friday April 24th, 2015  Report to [facility] at 715 AM.  Call this number if you have problems the morning of surgery [phone]   Remember:  Do not eat food or drink liquids :After Midnight.     Take these medicines the morning of surgery with A SIP OF WATER: no meds to take                               You may not have any metal on your body including hair pins and piercings  Do not wear jewelry, make-up, lotions, powders, or deodorant.   Men may shave face and neck.  Do not bring valuables to the hospital. [facility] IS NOT RESPONSIBLE FOR VALUABLES.  Contacts, dentures or bridgework may not be worn into surgery.  Leave suitcase in the car. After surgery it may be brought to your room.  For patients admitted to the hospital, checkout time is 11:00 AM the day of discharge.    [facility] - Preparing for Surgery Before surgery, you can play an important role.  Because skin is not sterile, your skin needs to be as free of germs as possible.  You can reduce the number of germs on your skin by washing with CHG (chlorahexidine gluconate) soap before surgery.  CHG is an antiseptic cleaner which kills germs and bonds with the skin to continue killing germs even after washing. Please DO NOT use if you have an allergy to CHG or antibacterial soaps.  If your skin becomes reddened/irritated stop using the CHG and inform your nurse when you arrive at Short Stay. Do not shave (including legs and underarms) for at least 48 hours prior to the first CHG shower.  You may shave your face. Please follow these instructions carefully:  1.  Shower with CHG Soap the night before surgery and the  morning of Surgery.  2.  If you choose to wash your hair, wash your hair first as usual with your  normal  shampoo.  3.  After you shampoo, rinse your hair and body thoroughly to remove the  shampoo.                           4.   Use CHG as you would any other liquid soap.  You can apply chg directly  to the skin and wash                       Gently with a scrungie or clean washcloth.  5.  Apply the CHG Soap to your body ONLY FROM THE NECK DOWN.   Do not use on open                           Wound or open sores. Avoid contact with eyes, ears mouth and genitals (private parts).                        Genitals (private parts) with your normal soap.             6.  Wash thoroughly, paying special attention to the area where your surgery  will be performed.  7.  Thoroughly rinse your body with warm water from the  neck down.  8.  DO NOT shower/wash with your normal soap after using and rinsing off  the CHG Soap.                9.  Pat yourself dry with a clean towel.            10.  Wear clean pajamas.            11.  Place clean sheets on your bed the night of your first shower and do not  sleep with pets. Day of Surgery : Do not apply any lotions/deodorants the morning of surgery.  Please wear clean clothes to the hospital/surgery center.  FAILURE TO FOLLOW THESE INSTRUCTIONS MAY RESULT IN THE CANCELLATION OF YOUR SURGERY PATIENT SIGNATURE_________________________________  NURSE SIGNATURE__________________________________  WHAT IS A BLOOD TRANSFUSION? Blood Transfusion Information  A transfusion is the replacement of blood or some of its parts. Blood is made up of multiple cells which provide different functions.  Red blood cells carry oxygen and are used for blood loss replacement.  White blood cells fight against infection.  Platelets control bleeding.  Plasma helps clot blood.  Other blood products are available for specialized needs, such as hemophilia or other clotting disorders. BEFORE THE TRANSFUSION  Who gives blood for transfusions?   Healthy volunteers who are fully evaluated to make sure their blood is safe. This is blood bank blood. Transfusion therapy is the safest it has ever been in the  practice of medicine. Before blood is taken from a donor, a complete history is taken to make sure that person has no history of diseases nor engages in risky social behavior (examples are intravenous drug use or sexual activity with multiple partners). The donor's travel history is screened to minimize risk of transmitting infections, such as malaria. The donated blood is tested for signs of infectious diseases, such as HIV and hepatitis. The blood is then tested to be sure it is compatible with you in order to minimize the chance of a transfusion reaction. If you or a relative donates blood, this is often done in anticipation of surgery and is not appropriate for emergency situations. It takes many days to process the donated blood. RISKS AND COMPLICATIONS Although transfusion therapy is very safe and saves many lives, the main dangers of transfusion include:   Getting an infectious disease.  Developing a transfusion reaction. This is an allergic reaction to something in the blood you were given. Every precaution is taken to prevent this. The decision to have a blood transfusion has been considered carefully by your caregiver before blood is given. Blood is not given unless the benefits outweigh the risks. AFTER THE TRANSFUSION  Right after receiving a blood transfusion, you will usually feel much better and more energetic. This is especially true if your red blood cells have gotten low (anemic). The transfusion raises the level of the red blood cells which carry oxygen, and this usually causes an energy increase.  The nurse administering the transfusion will monitor you carefully for complications. HOME CARE INSTRUCTIONS  No special instructions are needed after a transfusion. You may find your energy is better. Speak with your caregiver about any limitations on activity for underlying diseases you may have. SEEK MEDICAL CARE IF:   Your condition is not improving after your transfusion.  You  develop redness or irritation at the intravenous (IV) site. SEEK IMMEDIATE MEDICAL CARE IF:  Any of the following symptoms occur over the next 12 hours:  Shaking  chills.  You have a temperature by mouth above 102 F (38.9 C), not controlled by medicine.  Chest, back, or muscle pain.  People around you feel you are not acting correctly or are confused.  Shortness of breath or difficulty breathing.  Dizziness and fainting.  You get a rash or develop hives.  You have a decrease in urine output.  Your urine turns a dark color or changes to pink, red, or brown. Any of the following symptoms occur over the next 10 days:  You have a temperature by mouth above 102 F (38.9 C), not controlled by medicine.  Shortness of breath.  Weakness after normal activity.  The white part of the eye turns yellow (jaundice).  You have a decrease in the amount of urine or are urinating less often.  Your urine turns a dark color or changes to pink, red, or brown. Document Released: 08/22/2000 Document Revised: 11/17/2011 Document Reviewed: 04/10/2008 [facility] Patient Information 2014 [place], [place].

## 2013-12-23 ENCOUNTER — Encounter (HOSPITAL_COMMUNITY)
Admission: RE | Admit: 2013-12-23 | Discharge: 2013-12-23 | Disposition: A | Discharge status: Home / Self Care | Payer: BC Managed Care – PPO | Source: Ambulatory Visit | Attending: Orthopaedic Surgery | Admitting: Orthopaedic Surgery

## 2013-12-23 ENCOUNTER — Ambulatory Visit (HOSPITAL_COMMUNITY)
Admission: RE | Admit: 2013-12-23 | Discharge: 2013-12-23 | Disposition: A | Discharge status: Home / Self Care | Payer: BC Managed Care – PPO | Source: Ambulatory Visit | Attending: Anesthesiology | Admitting: Anesthesiology

## 2013-12-23 ENCOUNTER — Encounter (INDEPENDENT_AMBULATORY_CARE_PROVIDER_SITE_OTHER): Payer: Self-pay

## 2013-12-23 ENCOUNTER — Encounter (HOSPITAL_COMMUNITY): Payer: Self-pay

## 2013-12-23 DIAGNOSIS — Z01812 Encounter for preprocedural laboratory examination: Secondary | ICD-10-CM | POA: Insufficient documentation

## 2013-12-23 DIAGNOSIS — Z01818 Encounter for other preprocedural examination: Secondary | ICD-10-CM | POA: Insufficient documentation

## 2013-12-23 HISTORY — DX: Unspecified osteoarthritis, unspecified site: M19.90

## 2013-12-23 HISTORY — DX: Peripheral vascular disease, unspecified: I73.9

## 2013-12-23 LAB — URINALYSIS, ROUTINE W REFLEX MICROSCOPIC
Bilirubin Urine: NEGATIVE
GLUCOSE, UA: NEGATIVE mg/dL
Hgb urine dipstick: NEGATIVE
Ketones, ur: NEGATIVE mg/dL
LEUKOCYTES UA: NEGATIVE
Nitrite: NEGATIVE
PH: 5.5 (ref 5.0–8.0)
Protein, ur: NEGATIVE mg/dL
Specific Gravity, Urine: 1.007 (ref 1.005–1.030)
Urobilinogen, UA: 0.2 mg/dL (ref 0.0–1.0)

## 2013-12-23 LAB — CBC
HCT: 44.2 % (ref 39.0–52.0)
HEMOGLOBIN: 14.8 g/dL (ref 13.0–17.0)
MCH: 29.9 pg (ref 26.0–34.0)
MCHC: 33.5 g/dL (ref 30.0–36.0)
MCV: 89.3 fL (ref 78.0–100.0)
Platelets: 280 10*3/uL (ref 150–400)
RBC: 4.95 MIL/uL (ref 4.22–5.81)
RDW: 12.7 % (ref 11.5–15.5)
WBC: 8.1 10*3/uL (ref 4.0–10.5)

## 2013-12-23 LAB — BASIC METABOLIC PANEL
BUN: 12 mg/dL (ref 6–23)
CO2: 30 meq/L (ref 19–32)
Calcium: 9.7 mg/dL (ref 8.4–10.5)
Chloride: 99 mEq/L (ref 96–112)
Creatinine, Ser: 0.92 mg/dL (ref 0.50–1.35)
GFR calc Af Amer: >90 mL/min (ref >90)
GLUCOSE: 100 mg/dL — AB (ref 70–99)
POTASSIUM: 4.5 meq/L (ref 3.7–5.3)
SODIUM: 137 meq/L (ref 137–147)

## 2013-12-23 LAB — APTT: aPTT: 30 seconds (ref 24–37)

## 2013-12-23 LAB — SURGICAL PCR SCREEN
MRSA, PCR: NEGATIVE
STAPHYLOCOCCUS AUREUS: NEGATIVE

## 2013-12-23 LAB — PROTIME-INR
INR: 0.94 (ref 0.00–1.49)
Prothrombin Time: 12.4 seconds (ref 11.6–15.2)

## 2013-12-30 ENCOUNTER — Encounter (HOSPITAL_COMMUNITY): Payer: Self-pay | Admitting: *Deleted

## 2013-12-30 ENCOUNTER — Encounter (HOSPITAL_COMMUNITY)
Admission: RE | Disposition: A | Discharge status: Home Health | Payer: Self-pay | Source: Ambulatory Visit | Attending: Orthopaedic Surgery

## 2013-12-30 ENCOUNTER — Encounter (HOSPITAL_COMMUNITY): Payer: BC Managed Care – PPO | Admitting: Anesthesiology

## 2013-12-30 ENCOUNTER — Inpatient Hospital Stay (HOSPITAL_COMMUNITY): Payer: BC Managed Care – PPO

## 2013-12-30 ENCOUNTER — Ambulatory Visit (HOSPITAL_COMMUNITY): Payer: BC Managed Care – PPO

## 2013-12-30 ENCOUNTER — Ambulatory Visit (HOSPITAL_COMMUNITY): Payer: BC Managed Care – PPO | Admitting: Anesthesiology

## 2013-12-30 ENCOUNTER — Inpatient Hospital Stay (HOSPITAL_COMMUNITY)
Admission: RE | Admit: 2013-12-30 | Discharge: 2014-01-01 | DRG: 470 | Disposition: A | Discharge status: Home Health | Payer: BC Managed Care – PPO | Source: Ambulatory Visit | Attending: Orthopaedic Surgery | Admitting: Orthopaedic Surgery

## 2013-12-30 DIAGNOSIS — M1612 Unilateral primary osteoarthritis, left hip: Secondary | ICD-10-CM

## 2013-12-30 DIAGNOSIS — Z79899 Other long term (current) drug therapy: Secondary | ICD-10-CM

## 2013-12-30 DIAGNOSIS — E785 Hyperlipidemia, unspecified: Secondary | ICD-10-CM | POA: Diagnosis present

## 2013-12-30 DIAGNOSIS — M1610 Unilateral primary osteoarthritis, unspecified hip: Principal | ICD-10-CM | POA: Diagnosis present

## 2013-12-30 DIAGNOSIS — Z96649 Presence of unspecified artificial hip joint: Secondary | ICD-10-CM

## 2013-12-30 DIAGNOSIS — I252 Old myocardial infarction: Secondary | ICD-10-CM

## 2013-12-30 DIAGNOSIS — E7800 Pure hypercholesterolemia, unspecified: Secondary | ICD-10-CM | POA: Diagnosis present

## 2013-12-30 DIAGNOSIS — Z7982 Long term (current) use of aspirin: Secondary | ICD-10-CM

## 2013-12-30 DIAGNOSIS — I7300 Raynaud's syndrome without gangrene: Secondary | ICD-10-CM | POA: Diagnosis present

## 2013-12-30 DIAGNOSIS — I10 Essential (primary) hypertension: Secondary | ICD-10-CM | POA: Diagnosis present

## 2013-12-30 DIAGNOSIS — I2510 Atherosclerotic heart disease of native coronary artery without angina pectoris: Secondary | ICD-10-CM | POA: Diagnosis present

## 2013-12-30 DIAGNOSIS — Z791 Long term (current) use of non-steroidal anti-inflammatories (NSAID): Secondary | ICD-10-CM

## 2013-12-30 DIAGNOSIS — M76899 Other specified enthesopathies of unspecified lower limb, excluding foot: Secondary | ICD-10-CM | POA: Diagnosis present

## 2013-12-30 DIAGNOSIS — M169 Osteoarthritis of hip, unspecified: Principal | ICD-10-CM | POA: Diagnosis present

## 2013-12-30 HISTORY — PX: TOTAL HIP ARTHROPLASTY: SHX124

## 2013-12-30 LAB — TYPE AND SCREEN
ABO/RH(D): O POS
Antibody Screen: NEGATIVE

## 2013-12-30 LAB — ABO/RH: ABO/RH(D): O POS

## 2013-12-30 SURGERY — ARTHROPLASTY, HIP, TOTAL, ANTERIOR APPROACH
Anesthesia: Spinal | Site: Hip | Laterality: Left

## 2013-12-30 MED ORDER — OXYCODONE HCL 5 MG/5ML PO SOLN
5.0000 mg | Freq: Once | ORAL | Status: DC | PRN
  Filled 2013-12-30: qty 5

## 2013-12-30 MED ORDER — SODIUM CHLORIDE 0.9 % IV SOLN
INTRAVENOUS | Status: DC
  Administered 2013-12-30 – 2013-12-31 (×2): via INTRAVENOUS

## 2013-12-30 MED ORDER — CEFAZOLIN SODIUM 1-5 GM-% IV SOLN
1.0000 g | Freq: Four times a day (QID) | INTRAVENOUS | Status: AC
  Administered 2013-12-30 (×2): 1 g via INTRAVENOUS
  Filled 2013-12-30 (×2): qty 50

## 2013-12-30 MED ORDER — MIDAZOLAM HCL 5 MG/5ML IJ SOLN
INTRAMUSCULAR | Status: DC | PRN
  Administered 2013-12-30: 2 mg via INTRAVENOUS

## 2013-12-30 MED ORDER — PROPOFOL 10 MG/ML IV BOLUS
INTRAVENOUS | Status: AC
  Filled 2013-12-30: qty 20

## 2013-12-30 MED ORDER — ATORVASTATIN CALCIUM 40 MG PO TABS
40.0000 mg | ORAL_TABLET | Freq: Every day | ORAL | Status: DC
  Administered 2013-12-30 – 2014-01-01 (×3): 40 mg via ORAL
  Filled 2013-12-30 (×3): qty 1

## 2013-12-30 MED ORDER — POLYETHYLENE GLYCOL 3350 17 G PO PACK: 17.0000 g | PACK | Freq: Every day | ORAL | Status: DC | PRN

## 2013-12-30 MED ORDER — PROPOFOL 10 MG/ML IV BOLUS
INTRAVENOUS | Status: DC | PRN
  Administered 2013-12-30: 30 mg via INTRAVENOUS

## 2013-12-30 MED ORDER — BUPIVACAINE HCL (PF) 0.5 % IJ SOLN
INTRAMUSCULAR | Status: DC | PRN
  Administered 2013-12-30: 3 mg

## 2013-12-30 MED ORDER — METHOCARBAMOL 100 MG/ML IJ SOLN
500.0000 mg | Freq: Four times a day (QID) | INTRAVENOUS | Status: DC | PRN
  Administered 2013-12-30: 500 mg via INTRAVENOUS
  Filled 2013-12-30: qty 5

## 2013-12-30 MED ORDER — MIDAZOLAM HCL 2 MG/2ML IJ SOLN
INTRAMUSCULAR | Status: AC
  Filled 2013-12-30: qty 2

## 2013-12-30 MED ORDER — MENTHOL 3 MG MT LOZG
1.0000 | LOZENGE | OROMUCOSAL | Status: DC | PRN
  Filled 2013-12-30: qty 9

## 2013-12-30 MED ORDER — ASPIRIN EC 325 MG PO TBEC
325.0000 mg | DELAYED_RELEASE_TABLET | Freq: Two times a day (BID) | ORAL | Status: DC
  Administered 2013-12-31 – 2014-01-01 (×3): 325 mg via ORAL
  Filled 2013-12-30 (×5): qty 1

## 2013-12-30 MED ORDER — ONDANSETRON HCL 4 MG PO TABS: 4.0000 mg | ORAL_TABLET | Freq: Four times a day (QID) | ORAL | Status: DC | PRN

## 2013-12-30 MED ORDER — ALUM & MAG HYDROXIDE-SIMETH 200-200-20 MG/5ML PO SUSP: 30.0000 mL | ORAL | Status: DC | PRN

## 2013-12-30 MED ORDER — METOCLOPRAMIDE HCL 5 MG/ML IJ SOLN: 5.0000 mg | Freq: Three times a day (TID) | INTRAMUSCULAR | Status: DC | PRN

## 2013-12-30 MED ORDER — ONDANSETRON HCL 4 MG/2ML IJ SOLN: 4.0000 mg | Freq: Four times a day (QID) | INTRAMUSCULAR | Status: DC | PRN

## 2013-12-30 MED ORDER — KETOROLAC TROMETHAMINE 15 MG/ML IJ SOLN
30.0000 mg | Freq: Four times a day (QID) | INTRAMUSCULAR | Status: AC | PRN
  Administered 2013-12-30 – 2013-12-31 (×4): 30 mg via INTRAVENOUS
  Filled 2013-12-30 (×3): qty 2
  Filled 2013-12-30: qty 1
  Filled 2013-12-30 (×2): qty 2

## 2013-12-30 MED ORDER — FENTANYL CITRATE 0.05 MG/ML IJ SOLN
INTRAMUSCULAR | Status: DC | PRN
  Administered 2013-12-30 (×2): 50 ug via INTRAVENOUS

## 2013-12-30 MED ORDER — HYDROMORPHONE HCL PF 1 MG/ML IJ SOLN
1.0000 mg | INTRAMUSCULAR | Status: DC | PRN
  Administered 2013-12-30 – 2013-12-31 (×6): 1 mg via INTRAVENOUS
  Administered 2013-12-31: 0.5 mg via INTRAVENOUS
  Filled 2013-12-30 (×7): qty 1

## 2013-12-30 MED ORDER — DIPHENHYDRAMINE HCL 12.5 MG/5ML PO ELIX
12.5000 mg | ORAL_SOLUTION | ORAL | Status: DC | PRN
  Administered 2013-12-31 (×2): 25 mg via ORAL
  Filled 2013-12-30 (×2): qty 10

## 2013-12-30 MED ORDER — METHOCARBAMOL 500 MG PO TABS
500.0000 mg | ORAL_TABLET | Freq: Four times a day (QID) | ORAL | Status: DC | PRN
  Administered 2013-12-30 – 2014-01-01 (×5): 500 mg via ORAL
  Filled 2013-12-30 (×5): qty 1

## 2013-12-30 MED ORDER — MEPERIDINE HCL 50 MG/ML IJ SOLN: 6.2500 mg | INTRAMUSCULAR | Status: DC | PRN

## 2013-12-30 MED ORDER — 0.9 % SODIUM CHLORIDE (POUR BTL) OPTIME
TOPICAL | Status: DC | PRN
  Administered 2013-12-30: 1000 mL

## 2013-12-30 MED ORDER — SODIUM CHLORIDE 0.9 % IR SOLN
Status: DC | PRN
  Administered 2013-12-30: 1000 mL

## 2013-12-30 MED ORDER — PROPOFOL INFUSION 10 MG/ML OPTIME
INTRAVENOUS | Status: DC | PRN
  Administered 2013-12-30: 200 ug/kg/min via INTRAVENOUS

## 2013-12-30 MED ORDER — NITROGLYCERIN 0.4 MG SL SUBL
0.4000 mg | SUBLINGUAL_TABLET | SUBLINGUAL | Status: DC | PRN
Start: 2013-12-30 — End: 2014-01-01

## 2013-12-30 MED ORDER — ACETAMINOPHEN 650 MG RE SUPP
650.0000 mg | Freq: Four times a day (QID) | RECTAL | Status: DC | PRN
Start: 2013-12-30 — End: 2014-01-01

## 2013-12-30 MED ORDER — KETAMINE HCL 50 MG/ML IJ SOLN: INTRAMUSCULAR | Status: DC | PRN

## 2013-12-30 MED ORDER — LACTATED RINGERS IV SOLN
INTRAVENOUS | Status: DC
  Administered 2013-12-30: 1000 mL via INTRAVENOUS

## 2013-12-30 MED ORDER — LACTATED RINGERS IV SOLN
INTRAVENOUS | Status: DC | PRN
  Administered 2013-12-30 (×2): via INTRAVENOUS

## 2013-12-30 MED ORDER — DOCUSATE SODIUM 100 MG PO CAPS
100.0000 mg | ORAL_CAPSULE | Freq: Two times a day (BID) | ORAL | Status: DC
  Administered 2013-12-30 – 2014-01-01 (×4): 100 mg via ORAL

## 2013-12-30 MED ORDER — FERROUS SULFATE 325 (65 FE) MG PO TABS
325.0000 mg | ORAL_TABLET | Freq: Three times a day (TID) | ORAL | Status: DC
  Administered 2013-12-31 – 2014-01-01 (×2): 325 mg via ORAL
  Filled 2013-12-30 (×7): qty 1

## 2013-12-30 MED ORDER — FENTANYL CITRATE 0.05 MG/ML IJ SOLN
INTRAMUSCULAR | Status: AC
  Filled 2013-12-30: qty 2

## 2013-12-30 MED ORDER — CEFAZOLIN SODIUM-DEXTROSE 2-3 GM-% IV SOLR
INTRAVENOUS | Status: AC
  Filled 2013-12-30: qty 50

## 2013-12-30 MED ORDER — METOCLOPRAMIDE HCL 10 MG PO TABS: 5.0000 mg | ORAL_TABLET | Freq: Three times a day (TID) | ORAL | Status: DC | PRN

## 2013-12-30 MED ORDER — HYDROMORPHONE HCL PF 1 MG/ML IJ SOLN: 0.2500 mg | INTRAMUSCULAR | Status: DC | PRN

## 2013-12-30 MED ORDER — PROMETHAZINE HCL 25 MG/ML IJ SOLN: 6.2500 mg | INTRAMUSCULAR | Status: DC | PRN

## 2013-12-30 MED ORDER — OXYCODONE HCL ER 20 MG PO T12A
20.0000 mg | EXTENDED_RELEASE_TABLET | Freq: Two times a day (BID) | ORAL | Status: DC
  Administered 2013-12-30 – 2014-01-01 (×4): 20 mg via ORAL
  Filled 2013-12-30 (×4): qty 1

## 2013-12-30 MED ORDER — CEFAZOLIN SODIUM-DEXTROSE 2-3 GM-% IV SOLR
2.0000 g | INTRAVENOUS | Status: AC
  Administered 2013-12-30: 2 g via INTRAVENOUS

## 2013-12-30 MED ORDER — ISOSORBIDE MONONITRATE ER 30 MG PO TB24
30.0000 mg | ORAL_TABLET | Freq: Every morning | ORAL | Status: DC
  Administered 2013-12-30 – 2014-01-01 (×3): 30 mg via ORAL
  Filled 2013-12-30 (×3): qty 1

## 2013-12-30 MED ORDER — PHENOL 1.4 % MT LIQD
1.0000 | OROMUCOSAL | Status: DC | PRN
  Filled 2013-12-30: qty 177

## 2013-12-30 MED ORDER — OXYCODONE HCL 5 MG PO TABS: 5.0000 mg | ORAL_TABLET | Freq: Once | ORAL | Status: DC | PRN

## 2013-12-30 MED ORDER — ACETAMINOPHEN 325 MG PO TABS
650.0000 mg | ORAL_TABLET | Freq: Four times a day (QID) | ORAL | Status: DC | PRN
  Administered 2014-01-01: 650 mg via ORAL
  Filled 2013-12-30: qty 2

## 2013-12-30 MED ORDER — OXYCODONE HCL 5 MG PO TABS
5.0000 mg | ORAL_TABLET | ORAL | Status: DC | PRN
  Administered 2013-12-30 – 2013-12-31 (×9): 10 mg via ORAL
  Administered 2014-01-01: 5 mg via ORAL
  Administered 2014-01-01 (×2): 10 mg via ORAL
  Filled 2013-12-30 (×4): qty 2
  Filled 2013-12-30: qty 1
  Filled 2013-12-30 (×8): qty 2

## 2013-12-30 SURGICAL SUPPLY — 43 items
APL SKNCLS STERI-STRIP NONHPOA (GAUZE/BANDAGES/DRESSINGS) ×1
BAG SPEC THK2 15X12 ZIP CLS (MISCELLANEOUS)
BAG ZIPLOCK 12X15 (MISCELLANEOUS) IMPLANT
BENZOIN TINCTURE PRP APPL 2/3 (GAUZE/BANDAGES/DRESSINGS) ×2 IMPLANT
BLADE SAW SGTL 18X1.27X75 (BLADE) ×2 IMPLANT
BLADE SAW SGTL 18X1.27X75MM (BLADE) ×1
CAPT HIP PF COP ×2 IMPLANT
CELLS DAT CNTRL 66122 CELL SVR (MISCELLANEOUS) ×1 IMPLANT
CLOSURE WOUND 1/2 X4 (GAUZE/BANDAGES/DRESSINGS) ×1
DRAPE C-ARM 42X120 X-RAY (DRAPES) ×3 IMPLANT
DRAPE STERI IOBAN 125X83 (DRAPES) ×3 IMPLANT
DRAPE U-SHAPE 47X51 STRL (DRAPES) ×9 IMPLANT
DRSG AQUACEL AG ADV 3.5X10 (GAUZE/BANDAGES/DRESSINGS) ×3 IMPLANT
DURAPREP 26ML APPLICATOR (WOUND CARE) ×3 IMPLANT
ELECT BLADE TIP CTD 4 INCH (ELECTRODE) ×3 IMPLANT
ELECT REM PT RETURN 9FT ADLT (ELECTROSURGICAL) ×3
ELECTRODE REM PT RTRN 9FT ADLT (ELECTROSURGICAL) ×1 IMPLANT
FACESHIELD WRAPAROUND (MASK) ×9 IMPLANT
FACESHIELD WRAPAROUND OR TEAM (MASK) ×4 IMPLANT
GAUZE XEROFORM 1X8 LF (GAUZE/BANDAGES/DRESSINGS) IMPLANT
GLOVE BIO SURGEON STRL SZ7.5 (GLOVE) ×3 IMPLANT
GLOVE BIOGEL PI IND STRL 8 (GLOVE) ×2 IMPLANT
GLOVE BIOGEL PI INDICATOR 8 (GLOVE) ×4
GLOVE ECLIPSE 8.0 STRL XLNG CF (GLOVE) ×3 IMPLANT
GOWN STRL REUS W/TWL XL LVL3 (GOWN DISPOSABLE) ×6 IMPLANT
HANDPIECE INTERPULSE COAX TIP (DISPOSABLE) ×3
KIT BASIN OR (CUSTOM PROCEDURE TRAY) ×3 IMPLANT
PACK TOTAL JOINT (CUSTOM PROCEDURE TRAY) ×3 IMPLANT
PADDING CAST COTTON 6X4 STRL (CAST SUPPLIES) ×3 IMPLANT
RETRACTOR WND ALEXIS 18 MED (MISCELLANEOUS) ×1 IMPLANT
RTRCTR WOUND ALEXIS 18CM MED (MISCELLANEOUS) ×3
SET HNDPC FAN SPRY TIP SCT (DISPOSABLE) ×1 IMPLANT
STAPLER VISISTAT 35W (STAPLE) IMPLANT
STRIP CLOSURE SKIN 1/2X4 (GAUZE/BANDAGES/DRESSINGS) ×2 IMPLANT
SUT ETHIBOND NAB CT1 #1 30IN (SUTURE) ×3 IMPLANT
SUT ETHILON 3 0 PS 1 (SUTURE) IMPLANT
SUT MNCRL AB 4-0 PS2 18 (SUTURE) ×3 IMPLANT
SUT VIC AB 0 CT1 36 (SUTURE) ×3 IMPLANT
SUT VIC AB 1 CT1 36 (SUTURE) ×3 IMPLANT
SUT VIC AB 2-0 CT1 27 (SUTURE) ×3
SUT VIC AB 2-0 CT1 TAPERPNT 27 (SUTURE) ×1 IMPLANT
TOWEL OR 17X26 10 PK STRL BLUE (TOWEL DISPOSABLE) ×3 IMPLANT
TRAY FOLEY METER SIL LF 16FR (CATHETERS) ×2 IMPLANT

## 2013-12-30 NOTE — H&P (Signed)
TOTAL HIP ADMISSION H&P  Patient is admitted for left total hip arthroplasty.  Subjective:  Chief Complaint: left hip pain  HPI: Gabriel Anderson, [age] y.o. male, has a history of pain and functional disability in the left hip(s) due to arthritis and patient has failed non-surgical conservative treatments for greater than 12 weeks to include NSAID's and/or analgesics, corticosteriod injections, weight reduction as appropriate and activity modification.  Onset of symptoms was gradual starting 3 years ago with gradually worsening course since that time.The patient noted no past surgery on the left hip(s).  Patient currently rates pain in the left hip at 10 out of 10 with activity. Patient has night pain, worsening of pain with activity and weight bearing, trendelenberg gait, pain that interfers with activities of daily living and pain with passive range of motion. Patient has evidence of subchondral sclerosis, periarticular osteophytes and joint space narrowing by imaging studies. This condition presents safety issues increasing the risk of falls.  There is no current active infection.  Patient Active Problem List   Diagnosis Date Noted  . Arthritis of left hip 12/30/2013  . Hypertension   . Hyperlipidemia   . Acute myocardial infarction, subendocardial infarction, initial episode of care 06/02/2012  . Pure hypercholesterolemia 06/02/2012  . Other nonspecific abnormal cardiovascular system function study 05/21/2012  . Coronary atherosclerosis of native coronary artery 05/21/2012   Past Medical History  Diagnosis Date  . Coronary atherosclerosis of native coronary artery 05/21/2012    Cardiac catheterization 05/21/12-95% mid LAD lesion at the bifurcation of a large diagonal branch. Normal ejection fraction.  . Raynaud's disease   . Hyperlipidemia   . Acute myocardial infarction, subendocardial infarction, initial episode of care 2013    Cardiac catheterization 06/02/12-widely patent previously  placed LAD stents. Troponin peaked 3. Normal EF.  [name]. Arthritis   . Vasospasm     takes imdur for    Past Surgical History  Procedure Laterality Date  . Rotator cuff repair Right 2009  . Elbow surgery Left 1984  . Cardiac catheterization  05/2012    PCI LAD at takeoff of large diagonal  .  cardiacstent x 2  2013    Prescriptions prior to admission  Medication Sig Dispense Refill  . aspirin EC 81 MG tablet Take 81 mg by mouth every morning.      [name]. atorvastatin (LIPITOR) 40 MG tablet Take 1 tablet (40 mg total) by mouth daily.  90 tablet  3  . ibuprofen (ADVIL,MOTRIN) 800 MG tablet Take 800 mg by mouth every 8 (eight) hours as needed (Pain).      . isosorbide mononitrate (IMDUR) 30 MG 24 hr tablet Take 30 mg by mouth every morning.      . naproxen sodium (ANAPROX) 220 MG tablet Take 440 mg by mouth 2 (two) times daily as needed (Pain).      . nitroGLYCERIN (NITROSTAT) 0.4 MG SL tablet Place 1 tablet (0.4 mg total) under the tongue every 5 (five) minutes as needed.  100 tablet  1   No Known Allergies  History  Substance Use Topics  . Smoking status: Never Smoker   . Smokeless tobacco: Never Used  . Alcohol Use: 0.0 oz/week    3-6 Cans of beer per week    History reviewed. No pertinent family history.   Review of Systems  Musculoskeletal: Positive for joint pain.  All other systems reviewed and are negative.   Objective:  Physical Exam  Constitutional: He is oriented to person, place, and time.  He appears well-developed and well-nourished.  HENT:  Head: Normocephalic and atraumatic.  Eyes: EOM are normal. Pupils are equal, round, and reactive to light.  Neck: Normal range of motion. Neck supple.  Cardiovascular: Normal rate and regular rhythm.   Respiratory: Effort normal and breath sounds normal.  GI: Soft. Bowel sounds are normal.  Musculoskeletal:       Left hip: He exhibits decreased range of motion, decreased strength and bony tenderness.  Neurological: He is alert and  oriented to person, place, and time.  Skin: Skin is warm and dry.  Psychiatric: He has a normal mood and affect.    Vital signs in last 24 hours: Temp:  [97.5 F (36.4 C)] 97.5 F (36.4 C) (04/24 0723) Pulse Rate:  [56] 56 (04/24 0723) Resp:  [18] 18 (04/24 0723) BP: (135)/(74) 135/74 mmHg (04/24 0723) SpO2:  [98 %] 98 % (04/24 0723)  Labs:   Estimated body mass index is 34.15 kg/(m^2) as calculated from the following:   Height as of 12/23/13: 5\' 10"  (1.778 m).   Weight as of 09/19/13: 107.956 kg (238 lb).   Imaging Review Plain radiographs demonstrate severe degenerative joint disease of the left hip(s). The bone quality appears to be excellent for age and reported activity level.  Assessment/Plan:  End stage arthritis, left hip(s)  The patient history, physical examination, clinical judgement of the provider and imaging studies are consistent with end stage degenerative joint disease of the left hip(s) and total hip arthroplasty is deemed medically necessary. The treatment options including medical management, injection therapy, arthroscopy and arthroplasty were discussed at length. The risks and benefits of total hip arthroplasty were presented and reviewed. The risks due to aseptic loosening, infection, stiffness, dislocation/subluxation,  thromboembolic complications and other imponderables were discussed.  The patient acknowledged the explanation, agreed to proceed with the plan and consent was signed. Patient is being admitted for inpatient treatment for surgery, pain control, PT, OT, prophylactic antibiotics, VTE prophylaxis, progressive ambulation and ADL's and discharge planning.The patient is planning to be discharged home with home health services

## 2013-12-30 NOTE — Anesthesia Postprocedure Evaluation (Signed)
Anesthesia Post Note  Patient: Gabriel Anderson  Procedure(s) Performed: Procedure(s) (LRB): LEFT TOTAL HIP ARTHROPLASTY ANTERIOR APPROACH (Left)  Anesthesia type: Spinal  Patient location: PACU  Post pain: Pain level controlled  Post assessment: Post-op Vital signs reviewed  Last Vitals: BP 115/80  Pulse 48  Temp(Src) 36.4 C (Oral)  Resp 13  Ht 5\' 10"  (1.778 m)  Wt 232 lb 9.6 oz (105.507 kg)  BMI 33.37 kg/m2  SpO2 100%  Post vital signs: Reviewed  Level of consciousness: sedated  Complications: No apparent anesthesia complications

## 2013-12-30 NOTE — Anesthesia Preprocedure Evaluation (Addendum)
Anesthesia Evaluation  Patient identified by MRN, date of birth, ID band Patient awake    Reviewed: Allergy & Precautions, H&P , NPO status , Patient's Chart, lab work & pertinent test results  Airway Mallampati: II TM Distance: >3 FB Neck ROM: Full    Dental  (+) Dental Advisory Given   Pulmonary neg pulmonary ROS,  breath sounds clear to auscultation- rhonchi        Cardiovascular hypertension, Pt. on medications + CAD, + Past MI, + Cardiac Stents and + Peripheral Vascular Disease Rhythm:Regular Rate:Normal     Neuro/Psych negative neurological ROS  negative psych ROS   GI/Hepatic negative GI ROS, Neg liver ROS,   Endo/Other  negative endocrine ROS  Renal/GU negative Renal ROS     Musculoskeletal negative musculoskeletal ROS (+)   Abdominal   Peds  Hematology negative hematology ROS (+)   Anesthesia Other Findings   Reproductive/Obstetrics                           Anesthesia Physical Anesthesia Plan  ASA: III  Anesthesia Plan:    Post-op Pain Management:    Induction:   Airway Management Planned:   Additional Equipment:   Intra-op Plan:   Post-operative Plan:   Informed Consent: I have reviewed the patients History and Physical, chart, labs and discussed the procedure including the risks, benefits and alternatives for the proposed anesthesia with the patient or authorized representative who has indicated his/her understanding and acceptance.   Dental advisory given  Plan Discussed with: CRNA  Anesthesia Plan Comments:         Anesthesia Quick Evaluation

## 2013-12-30 NOTE — Anesthesia Procedure Notes (Signed)
Spinal  Patient location during procedure: OR Start time: 12/30/2013 10:03 AM End time: 12/30/2013 10:08 AM Staffing CRNA/Resident: Anne Fu Performed by: resident/CRNA  Preanesthetic Checklist Completed: patient identified, site marked, surgical consent, pre-op evaluation, timeout performed, IV checked, risks and benefits discussed and monitors and equipment checked Spinal Block Patient position: sitting Prep: Betadine Patient monitoring: heart rate, continuous pulse ox and blood pressure Location: L2-3 Injection technique: single-shot Needle Needle type: Sprotte  Needle gauge: 24 G Needle length: 9 cm Assessment Sensory level: T4 Additional Notes Expiration date of kit checked and confirmed. Patient tolerated procedure well, without complications. X 1 attempt with noted clear CSF return easy aspiration and administration of medication.

## 2013-12-30 NOTE — Brief Op Note (Signed)
12/30/2013  11:48 AM  PATIENT:  Gabriel Anderson  43 y.o. male  PRE-OPERATIVE DIAGNOSIS:  Severe osteoarthritis left hip  POST-OPERATIVE DIAGNOSIS:  Severe osteoarthritis left hip  PROCEDURE:  Procedure(s): LEFT TOTAL HIP ARTHROPLASTY ANTERIOR APPROACH (Left)  SURGEON:  Surgeon(s) and Role:    * Kathryne Hitchhristopher Y Blackman, MD - Primary  PHYSICIAN ASSISTANT: Rexene EdisonGil Clark, PA-C  ANESTHESIA:   spinal  EBL:  Total I/O In: 2000 [I.V.:2000] Out: 650 [Blood:650]  BLOOD ADMINISTERED:none  DRAINS: none   LOCAL MEDICATIONS USED:  NONE  SPECIMEN:  No Specimen  DISPOSITION OF SPECIMEN:  N/A  COUNTS:  YES  TOURNIQUET:  * No tourniquets in log *  DICTATION: .Other Dictation: Dictation Number [id number]  PLAN OF CARE: Admit to inpatient   PATIENT DISPOSITION:  PACU - hemodynamically stable.   Delay start of Pharmacological VTE agent (>24hrs) due to surgical blood loss or risk of bleeding: no

## 2013-12-30 NOTE — Transfer of Care (Signed)
Immediate Anesthesia Transfer of Care Note  Patient: Gabriel Anderson  Procedure(s) Performed: Procedure(s): LEFT TOTAL HIP ARTHROPLASTY ANTERIOR APPROACH (Left)  Patient Location: PACU  Anesthesia Type:MAC and Spinal  Level of Consciousness: Patient easily awoken, sedated, comfortable, cooperative, following commands, responds to stimulation.   Airway & Oxygen Therapy: Patient spontaneously breathing, ventilating well, oxygen via simple oxygen mask.  Post-op Assessment: Report given to PACU RN, vital signs reviewed and stable.   Post vital signs: Reviewed and stable.  Complications: No apparent anesthesia complications

## 2013-12-30 NOTE — Progress Notes (Signed)
CARE MANAGEMENT NOTE 12/30/2013  Patient:  Alroy DustHARDEN,Zamari T   Account Number:  [id number]  Date Initiated:  12/30/2013  Documentation initiated by:  DAVIS,RHONDA  Subjective/Objective Assessment:   pt having thr     Action/Plan:   home with hhc   Anticipated DC Date:  01/02/2014   Anticipated DC Plan:  HOME W HOME HEALTH SERVICES  In-house referral  NA      DC Planning Services  CM consult      [facility]  [facility]   Choice offered to / List presented to:  NA      DME agency  [facility]     HH arranged  HH-2 PT      [facility]   Status of service:  In process, will continue to follow Medicare Important Message given?  NO (If response is "NO", the following Medicare IM given date fields will be blank) Date Medicare IM given:   Date Additional Medicare IM given:    Discharge Disposition:    Per UR Regulation:  Reviewed for med. necessity/level of care/duration of stay  If discussed at Long Length of Stay Meetings, dates discussed:    Comments:  Patient had hhc [facility] prior [facility] stay [facility]. Rhonda Davis,RN,BSN,CCM

## 2013-12-31 LAB — CBC
HCT: 31.1 % — ABNORMAL LOW (ref 39.0–52.0)
Hemoglobin: 10.5 g/dL — ABNORMAL LOW (ref 13.0–17.0)
MCH: 30.2 pg (ref 26.0–34.0)
MCHC: 33.8 g/dL (ref 30.0–36.0)
MCV: 89.4 fL (ref 78.0–100.0)
PLATELETS: 224 10*3/uL (ref 150–400)
RBC: 3.48 MIL/uL — AB (ref 4.22–5.81)
RDW: 12.8 % (ref 11.5–15.5)
WBC: 8.3 10*3/uL (ref 4.0–10.5)

## 2013-12-31 LAB — BASIC METABOLIC PANEL
BUN: 10 mg/dL (ref 6–23)
CO2: 28 meq/L (ref 19–32)
Calcium: 8 mg/dL — ABNORMAL LOW (ref 8.4–10.5)
Chloride: 101 mEq/L (ref 96–112)
Creatinine, Ser: 0.97 mg/dL (ref 0.50–1.35)
GFR calc Af Amer: >90 mL/min (ref >90)
GFR calc non Af Amer: >90 mL/min (ref >90)
Glucose, Bld: 130 mg/dL — ABNORMAL HIGH (ref 70–99)
Potassium: 4 mEq/L (ref 3.7–5.3)
Sodium: 136 mEq/L — ABNORMAL LOW (ref 137–147)

## 2013-12-31 MED ORDER — METHOCARBAMOL 500 MG PO TABS: 500.0000 mg | ORAL_TABLET | Freq: Four times a day (QID) | ORAL | Status: DC | PRN

## 2013-12-31 MED ORDER — ASPIRIN 325 MG PO TBEC: 325.0000 mg | DELAYED_RELEASE_TABLET | Freq: Two times a day (BID) | ORAL | Status: DC

## 2013-12-31 MED ORDER — OXYCODONE-ACETAMINOPHEN 5-325 MG PO TABS: 1.0000 | ORAL_TABLET | ORAL | Status: DC | PRN

## 2013-12-31 NOTE — Progress Notes (Signed)
Occupational Therapy Evaluation Patient Details Name: Gabriel DustJeffrey T Marsan MRN: [id number] DOB: [birth year] Today's Date: 12/31/2013    History of Present Illness 43 yo male s/p L THA-direct anterior 12/30/13   Clinical Impression   Patient presents to OT with decreased ADL independence and safety; will benefit from skilled OT to maximize independence.    Follow Up Recommendations  No OT follow up;Supervision - Intermittent    Equipment Recommendations  3 in 1 bedside comode    Recommendations for Other Services       Precautions / Restrictions Precautions Precautions: Fall Restrictions Weight Bearing Restrictions: No LLE Weight Bearing: Weight bearing as tolerated      Mobility Bed Mobility Overal bed mobility: Needs Assistance Bed Mobility: Sit to Supine      Sit to supine: Min assist   General bed mobility comments: Assist for L LE. VCs safety, technique, hand placement.   Transfers Overall transfer level: Needs assistance Equipment used: Rolling walker (2 wheeled) Transfers: Sit to/from Stand Sit to Stand: Min guard;From elevated surface         General transfer comment: VCS safety, technique, hand placement. Assist to rise, stabilize, control descent     Balance                                            ADL Overall ADL's : Needs assistance/impaired Eating/Feeding: Independent;Sitting   Grooming: Set up;Sitting   Upper Body Bathing: Set up;Sitting   Lower Body Bathing: Minimal assistance;Sit to/from stand   Upper Body Dressing : Set up;Sitting   Lower Body Dressing: Minimal assistance;Sit to/from stand   Toilet Transfer: Min guard;Comfort height toilet;Grab bars;RW   PsychiatristToileting- Clothing Manipulation and Hygiene: Min guard;Sit to/from stand       Functional mobility during ADLs: Min guard General ADL Comments: Patient ambulated to/from BR min guard A with RW. Stood to urinate at toilet S. Washed hands standing at sink S with  RW.     Vision                     Perception     Praxis      Pertinent Vitals/Pain C/o stiffness LLE     Hand Dominance Right   Extremity/Trunk Assessment Upper Extremity Assessment Upper Extremity Assessment: Overall WFL for tasks assessed   Lower Extremity Assessment Lower Extremity Assessment: Defer to PT evaluation      Communication Communication Communication: No difficulties   Cognition Arousal/Alertness: Awake/alert Behavior During Therapy: WFL for tasks assessed/performed Overall Cognitive Status: Within Functional Limits for tasks assessed                     General Comments       Exercises       Shoulder Instructions      Home Living Family/patient expects to be discharged to:: Private residence Living Arrangements: Spouse/significant other Available Help at Discharge: Family Type of Home: House Home Access: Stairs to enter Entergy CorporationEntrance Stairs-Number of Steps: back-none.  1+4 at other entrance Entrance Stairs-Rails: None Home Layout: Multi-level Alternate Level Stairs-Number of Steps: 5 Alternate Level Stairs-Rails: Right Bathroom Shower/Tub: Chief Strategy OfficerTub/shower unit   Bathroom Toilet: Standard     Home Equipment: None          Prior Functioning/Environment Level of Independence: Independent             OT Diagnosis:  Acute pain   OT Problem List: Decreased strength;Decreased range of motion;Pain;Decreased knowledge of use of DME or AE   OT Treatment/Interventions: Self-care/ADL training;DME and/or AE instruction;Therapeutic activities;Patient/family education    OT Goals(Current goals can be found in the care plan section) Acute Rehab OT Goals Patient Stated Goal: regain independence. less pain OT Goal Formulation: With patient Time For Goal Achievement: 01/14/14 Potential to Achieve Goals: Good  OT Frequency: Min 2X/week   Barriers to D/C:            Co-evaluation              End of Session Equipment  Utilized During Treatment: Rolling walker  Activity Tolerance: Patient tolerated treatment well Patient left: in bed;with call bell/phone within reach   Time: 1048-1101 OT Time Calculation (min): 13 min Charges:  OT General Charges $OT Visit: 1 Procedure OT Evaluation $Initial OT Evaluation Tier I: 1 Procedure OT Treatments $Self Care/Home Management : 8-22 mins G-Codes:    Tyrice Hewitt A Henryetta Corriveau 12/31/2013, 11:07 AM

## 2013-12-31 NOTE — Evaluation (Signed)
Physical Therapy Evaluation Patient Details Name: Alroy DustJeffrey T Love MRN: [id number] DOB: 08-04-1971 Today's Date: 12/31/2013   History of Present Illness  43 yo male s/p L THA-direct anterior 12/30/13  Clinical Impression  On eval, pt required Min assist for mobility-able to ambulate ~150 feet with walker. Pt c/o L hip stiffness primarily. Tolerated activity well.     Follow Up Recommendations Home health PT    Equipment Recommendations  Rolling walker with 5" wheels    Recommendations for Other Services OT consult     Precautions / Restrictions Precautions Precautions: Fall Restrictions Weight Bearing Restrictions: No LLE Weight Bearing: Weight bearing as tolerated      Mobility  Bed Mobility Overal bed mobility: Needs Assistance Bed Mobility: Supine to Sit     Supine to sit: Min assist;HOB elevated     General bed mobility comments: Assist for L LE. VCs safety, technique, hand placement.   Transfers Overall transfer level: Needs assistance Equipment used: Rolling walker (2 wheeled) Transfers: Sit to/from Stand Sit to Stand: Min assist;From elevated surface         General transfer comment: VCS safety, technique, hand placement. Assist to rise, stabilize, control descent   Ambulation/Gait Ambulation/Gait assistance: Min guard Ambulation Distance (Feet): 150 Feet Assistive device: Rolling walker (2 wheeled) Gait Pattern/deviations: Step-through pattern;Decreased stride length;Antalgic;Decreased step length - left     General Gait Details: slow gait speed. Pt able to progress to reciprocal gait pattern with increased distance.   Stairs            Wheelchair Mobility    Modified Rankin (Stroke Patients Only)       Balance                                             Pertinent Vitals/Pain L hip "stiffness"-unrated. Ice applied end of session    Home Living Family/patient expects to be discharged to:: Private  residence Living Arrangements: Spouse/significant other Available Help at Discharge: Family Type of Home: House Home Access: Stairs to enter Entrance Stairs-Rails: None Entrance Stairs-Number of Steps: back-none.  1+4 at other entrance Home Layout: Multi-level Home Equipment: None      Prior Function Level of Independence: Independent               Hand Dominance        Extremity/Trunk Assessment   Upper Extremity Assessment: Overall WFL for tasks assessed           Lower Extremity Assessment: LLE deficits/detail   LLE Deficits / Details: moves ankle well. at least 3/5 throughout.   Cervical / Trunk Assessment: Normal  Communication   Communication: No difficulties  Cognition Arousal/Alertness: Awake/alert Behavior During Therapy: WFL for tasks assessed/performed Overall Cognitive Status: Within Functional Limits for tasks assessed                      General Comments      Exercises        Assessment/Plan    PT Assessment Patient needs continued PT services  PT Diagnosis Difficulty walking;Abnormality of gait;Acute pain   PT Problem List Decreased strength;Decreased range of motion;Decreased activity tolerance;Decreased balance;Decreased mobility;Pain;Decreased knowledge of use of DME  PT Treatment Interventions DME instruction;Gait training;Stair training;Functional mobility training;Therapeutic activities;Therapeutic exercise;Patient/family education;Balance training   PT Goals (Current goals can be found in the Care Plan section) Acute Rehab  PT Goals Patient Stated Goal: regain independence. less pain PT Goal Formulation: With patient Time For Goal Achievement: 01/07/14 Potential to Achieve Goals: Good    Frequency 7X/week   Barriers to discharge        Co-evaluation               End of Session Equipment Utilized During Treatment: Gait belt Activity Tolerance: Patient tolerated treatment well Patient left: in chair;with  call bell/phone within reach;with family/visitor present           Time: [id number] PT Time Calculation (min): 20 min   Charges:   PT Evaluation $Initial PT Evaluation Tier I: 1 Procedure PT Treatments $Gait Training: 8-22 mins   PT G Codes:          Rebeca AlertJannie Azriel Jakob, MPT Pager: [phone]

## 2013-12-31 NOTE — Progress Notes (Signed)
Physical Therapy Treatment Patient Details Name: Gabriel Anderson MRN: [id number] DOB: [birth year] Today's Date: 12/31/2013    History of Present Illness 43 yo male s/p L THA-direct anterior 12/30/13    PT Comments    Progressing well with mobility. Pt plans to d/c home on Sun. Need to practice steps.  Follow Up Recommendations  Home health PT     Equipment Recommendations  Rolling walker with 5" wheels    Recommendations for Other Services OT consult     Precautions / Restrictions Precautions Precautions: Fall Restrictions Weight Bearing Restrictions: No LLE Weight Bearing: Weight bearing as tolerated    Mobility  Bed Mobility Overal bed mobility: Needs Assistance Bed Mobility: Supine to Sit     Supine to sit: Min guard     General bed mobility comments: VCs safety, technique, hand placement.   Transfers Overall transfer level: Needs assistance Equipment used: Rolling walker (2 wheeled) Transfers: Sit to/from Stand Sit to Stand: Min guard         General transfer comment: VCS safety, technique, hand placement.  Ambulation/Gait Ambulation/Gait assistance: Min guard Ambulation Distance (Feet): 225 Feet Assistive device: Rolling walker (2 wheeled) Gait Pattern/deviations: Step-through pattern;Decreased stride length;Decreased step length - left;Antalgic         Stairs            Wheelchair Mobility    Modified Rankin (Stroke Patients Only)       Balance                                    Cognition                            Exercises Total Joint Exercises Hip ABduction/ADduction: Standing;AROM;Left;10 reps Long Arc Quad: AROM;Left;10 reps;Seated Knee Flexion: Standing;AROM;Left;10 reps Marching in Standing: AROM;Both;10 reps;Standing General Exercises - Lower Extremity Heel Raises: AROM;Both;10 reps;Standing    General Comments        Pertinent Vitals/Pain L LE 6/10. Ice applied end of session     Home Living                      Prior Function            PT Goals (current goals can now be found in the care plan section) Progress towards PT goals: Progressing toward goals    Frequency  7X/week    PT Plan Current plan remains appropriate    Co-evaluation             End of Session   Activity Tolerance: Patient tolerated treatment well Patient left: in chair;with call bell/phone within reach     Time: 1530-1553 PT Time Calculation (min): 23 min  Charges:  $Gait Training: 8-22 mins $Therapeutic Exercise: 8-22 mins                    G Codes:      Gabriel Anderson, MPT Pager: [phone]

## 2013-12-31 NOTE — Progress Notes (Signed)
Subjective: 1 Day Post-Op Procedure(s) (LRB): LEFT TOTAL HIP ARTHROPLASTY ANTERIOR APPROACH (Left) Patient reports pain as moderate.  Last night pain was severe and burning in thigh.   Objective: Vital signs in last 24 hours: Temp:  [97.4 F (36.3 C)-98.5 F (36.9 C)] 97.9 F (36.6 C) (04/25 0626) Pulse Rate:  [46-88] 81 (04/25 0626) Resp:  [10-18] 18 (04/25 0742) BP: (107-129)/(53-82) 115/71 mmHg (04/25 0626) SpO2:  [95 %-100 %] 100 % (04/25 0742) Weight:  [105.507 kg (232 lb 9.6 oz)] 105.507 kg (232 lb 9.6 oz) (04/24 1342)  Intake/Output from previous day: 04/24 0701 - 04/25 0700 In: 4907.5 [P.O.:540; I.V.:4212.5; IV Piggyback:155] Out: 2810 [Urine:2160; Blood:650] Intake/Output this shift:     Recent Labs  12/31/13 0525  HGB 10.5*    Recent Labs  12/31/13 0525  WBC 8.3  RBC 3.48*  HCT 31.1*  PLT 224    Recent Labs  12/31/13 0525  NA 136*  K 4.0  CL 101  CO2 28  BUN 10  CREATININE 0.97  GLUCOSE 130*  CALCIUM 8.0*   No results found for this basename: LABPT, INR,  in the last 72 hours  Neurologically intact  Assessment/Plan: 1 Day Post-Op Procedure(s) (LRB): LEFT TOTAL HIP ARTHROPLASTY ANTERIOR APPROACH (Left) Up with therapy Hgb stable. SL  IV  Eldred MangesMark C Ivalee Strauser 12/31/2013, 7:57 AM

## 2013-12-31 NOTE — Care Management Note (Addendum)
Cm spoke with patient at the bedside concerning discharge planning. [facility] Per [facility]. MD order for 3n1 and rw. [facility] rep Karoline CaldwellWinnie Troxler notified of dme  Referral only. Pt's spouse to assist with home care. No other needs identified at this time.   Roxy Mannsymeeka Breea Loncar,MSN,RN [phone]

## 2013-12-31 NOTE — Op Note (Signed)
NAME:  Gabriel Anderson, Gabriel Anderson              ACCOUNT NO.:  [id number]  MEDICAL RECORD NO.:  [id number]  LOCATION:  1614                         [facility]  PHYSICIAN:  Vanita PandaChristopher Y. Magnus IvanBlackman, M.D.DATE OF BIRTH:  [birth year]  DATE OF PROCEDURE:  12/30/2013 DATE OF DISCHARGE:                              OPERATIVE REPORT   PREOPERATIVE DIAGNOSIS:  Severe end-stage arthritis and degenerative joint disease, left hip.  POSTOPERATIVE DIAGNOSIS:  Severe end-stage arthritis and degenerative joint disease, left hip.  PROCEDURE:  Left total hip arthroplasty through direct anterior approach.  IMPLANTS:  DePuy Sector Gription acetabular component size 54 with apex hole eliminator guide and a single screw, size 36+ 4 neutral polyethylene liner, size 10 Corail femoral component with standard offset, size 36+ 1.5 ceramic hip ball.  SURGEON:  Vanita PandaChristopher Y. Magnus IvanBlackman, M.D.  ASSISTANT:  Richardean CanalGilbert Clark, PA-C  ANESTHESIA:  Spinal.  ANTIBIOTICS:  2 g IV Ancef.  BLOOD LOSS:  Less than 500 mL.  COMPLICATIONS:  None.  INDICATIONS:  [name] is only 43 year old gentleman with severe debilitating end-stage arthritis involving his left hip.  It is hurting for now over greater than 10 years.  He has tried every different form of conservative treatment.  He has x-rays that show almost complete loss of his joint space on the left side.  He has got periarticular osteophytes, subchondral sclerotic changes, and cystic changes.  Due to the failure of conservative treatment as well as his daily pain, his decreased mobility and decreased quality of life, he wished to proceed with a total hip arthroplasty.  He understands the risks of acute blood loss anemia, nerve and vessel injury, fracture, infection dislocation, and DVT.  He understands the goals are decreased pain, improved mobility, and overall improved quality of life.  PROCEDURE DESCRIPTION:  After informed consent was obtained, appropriate left  hip was marked.  He was brought to the operating room and spinal anesthesia was obtained while he was on the stretcher.  He was then placed in a supine position on the stretcher.  Foley catheter was placed and then traction boots were placed on both his feet.  Next, he was placed supine on the Hana fracture table with the perineal post in place and both legs in inline skeletal traction devices, but no traction applied.  We then prepped the left hip with DuraPrep and sterile drapes. Time-out was called to identify the correct patient, correct left hip. We then made an incision just distal and inferior to the anterior- superior iliac spine and carried this obliquely down the leg.  We dissected down to the tensor fascia lata muscle and then tensor fascia was divided longitudinally.  I then proceeded with a direct direct anterior approach to the hip.  A Cobra retractor was placed around the lateral neck and up underneath the rectus femoris, a Cobra retractor was placed medially.  I cauterized the lateral femoral circumflex vessels. I then opened up the hip capsule in L-type format finding significant osteophytes around the hip joint, femoral neck, and femoral head.  There was fusion as well.  We placed the Cobra retractors within the hip capsule and then used the oscillating saw to make my femoral neck  cut just proximal to the lesser trochanter.  I completed this with an osteotome.  I placed a corkscrew guide in the femoral head and removed the femoral head in its entirety.  __________ large areas completely devoid of cartilage, hard as a rock and smoothed down due to with years of osteoarthritis.  We then cleaned the acetabulum of debris including remnants of acetabular labrum.  I placed Hohmann over the medial acetabular rim and Cobra retractor laterally.  I started reaming from a size 42 reamer under direct visualization and direct fluoroscopy up to a size 54 with the last 2 reamers under  direct visualization as well as direct fluoroscopy.  We then were pleased with our depth of reaming, inclination, and anteversion.  I then placed the real DePuy Sector Gription acetabular component size 54, placed the apex hole eliminator guide and a single screw.  We then placed the real 36+ 4 neutral polyethylene liner.  Attention was then turned to the femur.  With the leg externally rotated to 100 degrees extended and adducted, we were able to place a Mueller retractor medially and Hohmann retractor behind the greater trochanter.  I released the lateral joint capsule only and then used a rongeur to lateralize a box cutting osteotome to enter the femoral canal.  Then, I began broaching from a size 8 broach only to a size 10 due to his young age and his thick cortices.  This was nice and stable, so we trialed a standard neck and the 36+ 1.5 hip ball, reducing to the acetabulum __________ over and up and put the leg through internal and external rotation.  It felt stable and had minimal shuck and its offset and leg lengths were measured under ankle under fluoroscopy.  We then dislocated the hip and removed the trial components.  We replaced the real Corail femoral component from the size 10 with standard offset and the real 36+ 1.5 ceramic hip ball.  We reduced this back in the acetabulum again it was stable.  We copiously irrigated the soft tissue with normal saline solution using pulsatile lavage.  We closed the joint capsule with interrupted #1 Ethibond suture followed by a running #1 Vicryl in the tensor fascia, 0 Vicryl the deep tissue, 2-0 Vicryl in subcutaneous tissue, 4-0 Monocryl subcuticular stitch and Steri-Strips on the skin.  An Aquacel dressing was applied. He was then taken off the Hana table to the recovery room in stable condition.  All final counts were correct and no complications noted.  Of note, Richardean CanalGilbert Clark, PA-C assisted in entire case and his assistance was  greatly needed in facilitating this case.     Vanita Pandahristopher Y. Magnus IvanBlackman, M.D.     CYB/MEDQ  D:  12/30/2013  T:  12/31/2013  Job:  [id number]

## 2014-01-01 LAB — CBC
HCT: 27.1 % — ABNORMAL LOW (ref 39.0–52.0)
HEMOGLOBIN: 8.9 g/dL — AB (ref 13.0–17.0)
MCH: 29.6 pg (ref 26.0–34.0)
MCHC: 32.8 g/dL (ref 30.0–36.0)
MCV: 90 fL (ref 78.0–100.0)
Platelets: 171 10*3/uL (ref 150–400)
RBC: 3.01 MIL/uL — ABNORMAL LOW (ref 4.22–5.81)
RDW: 12.8 % (ref 11.5–15.5)
WBC: 6.7 10*3/uL (ref 4.0–10.5)

## 2014-01-01 NOTE — Progress Notes (Signed)
Physical Therapy Treatment Patient Details Name: Gabriel DustJeffrey T Anderson MRN: [id number] DOB: June 16, 1971 Today's Date: 01/01/2014    History of Present Illness 43 yo male s/p L THA-direct anterior 12/30/13    PT Comments    Pt progressing, willlikely D/C home today; have cautioned pt not to over do it and to go slowly to avoid incr risk of falls  Follow Up Recommendations  Home health PT     Equipment Recommendations  Rolling walker with 5" wheels    Recommendations for Other Services OT consult     Precautions / Restrictions Precautions Precautions: Fall Restrictions Weight Bearing Restrictions: No LLE Weight Bearing: Weight bearing as tolerated    Mobility  Bed Mobility Overal bed mobility: Needs Assistance Bed Mobility: Supine to Sit     Supine to sit: Supervision;Modified independent (Device/Increase time)     General bed mobility comments: VCs safety, technique, hand placement.   Transfers Overall transfer level: Needs assistance Equipment used: Rolling walker (2 wheeled) Transfers: Sit to/from Stand Sit to Stand: Supervision         General transfer comment: VCS safety, technique, hand placement.  Ambulation/Gait Ambulation/Gait assistance: Supervision;Modified independent (Device/Increase time) Ambulation Distance (Feet): 300 Feet Assistive device: Rolling walker (2 wheeled) Gait Pattern/deviations: Step-to pattern;Step-through pattern     General Gait Details: progressing to stept through gait   Stairs Stairs: Yes Stairs assistance: Min guard Stair Management: One rail Right;Forwards;With walker;Step to pattern Number of Stairs: 4 General stair comments: cues for sequence and safety; pt moves   Wheelchair Mobility    Modified Rankin (Stroke Patients Only)       Balance                                    Cognition Arousal/Alertness: Awake/alert Behavior During Therapy: WFL for tasks assessed/performed Overall Cognitive  Status: Within Functional Limits for tasks assessed                      Exercises Total Joint Exercises Hip ABduction/ADduction: Standing;AROM;Left;10 reps Long Arc Quad: AROM;Left;10 reps;Seated Knee Flexion: Standing;AROM;Left;10 reps Marching in Standing: AROM;Both;10 reps;Standing General Exercises - Lower Extremity Heel Raises: AROM;Both;10 reps;Standing    General Comments        Pertinent Vitals/Pain "just sore but no worse than before surgery"    Home Living                      Prior Function            PT Goals (current goals can now be found in the care plan section) Acute Rehab PT Goals Patient Stated Goal: regain independence. less pain PT Goal Formulation: With patient Time For Goal Achievement: 01/07/14 Potential to Achieve Goals: Good Progress towards PT goals: Progressing toward goals    Frequency  7X/week    PT Plan Current plan remains appropriate    Co-evaluation             End of Session   Activity Tolerance: Patient tolerated treatment well Patient left: with call bell/phone within reach;in bed     Time: 0845-0909 PT Time Calculation (min): 24 min  Charges:  $Gait Training: 8-22 mins $Therapeutic Exercise: 8-22 mins                    G Codes:      Gabriel Anderson 01/01/2014, 9:17 AM

## 2014-01-01 NOTE — Progress Notes (Signed)
CARE MANAGEMENT NOTE 01/01/2014  Patient:  Gabriel Gabriel Anderson,Gabriel Gabriel Anderson   Account Number:  [id number]  Date Initiated:  12/30/2013  Documentation initiated by:  Gabriel Anderson,Gabriel  Subjective/Objective Assessment:   43 yo male admitted s/p left total hip arthroplasty     Action/Plan:   home with hhc   Anticipated DC Date:  01/02/2014   Anticipated DC Plan:  HOME W HOME HEALTH Anderson  In-house referral  NA      DC Planning Anderson  CM consult      PAC Choice  NA   Choice offered to / List presented to:  C-1 Patient   DME arranged  3-N-1  Gabriel Gabriel Anderson      DME agency  [facility]     HH arranged  HH-2 PT      [facility]  Gabriel Gabriel Anderson   Status of service:  Completed, signed off Medicare Important Message given?  NO (If response is "NO", the following Medicare IM given date fields will be blank) Date Medicare IM given:   Date Additional Medicare IM given:    Discharge Disposition:  HOME W HOME HEALTH Anderson  Per UR Regulation:  Reviewed for med. necessity/level of care/duration of stay  If discussed at Long Length of Stay Meetings, dates discussed:    Comments:  01/01/2014 1030 NCM notified Gabriel Anderson of dc home. Provided pt contact number for Gabriel Anderson. Gabriel Gabriel Anderson Gabriel Gabriel Anderson Case Mgmt phone [phone]  Gabriel Gabriel Anderson, Gabriel Gabriel Anderson Registered Nurse Addendum CASE MANAGEMENT Care Management Note Service date: 12/31/2013 3:25 PM Cm spoke with patient at the bedside concerning discharge planning. [facility]. MD order for 3n1 and rw. [facility] rep Gabriel Gabriel Anderson notified of dme  Referral only. Pt's spouse to assist with home care. No other needs identified at this time.         [facility]. Gabriel Gabriel Anderson,Gabriel Gabriel Anderson,Gabriel Gabriel Anderson,Gabriel Gabriel Anderson

## 2014-01-01 NOTE — Progress Notes (Signed)
Occupational Therapy Treatment Patient Details Name: Gabriel Anderson MRN: [id number] DOB: 1970/10/02 Today's Date: 01/01/2014    History of present illness 43 yo male s/p L THA-direct anterior 12/30/13   OT comments  All OT education complete; OT signing off.  Follow Up Recommendations  No OT follow up;Supervision - Intermittent    Equipment Recommendations  3 in 1 bedside comode    Recommendations for Other Services      Precautions / Restrictions Precautions Precautions: Fall Restrictions Weight Bearing Restrictions: No LLE Weight Bearing: Weight bearing as tolerated       Mobility       Balance                                   ADL Overall ADL's : Needs assistance/impaired             Lower Body Bathing: Minimal assistance;Sit to/from stand       Lower Body Dressing: Minimal assistance;Sit to/from stand   Toilet Transfer: Supervision/safety   Toileting- Water quality scientist and Hygiene: Engineer, site Details (indicate cue type and reason): reviewed tub transfer technique and pt verbalized understanding but did not practice Functional mobility during ADLs: Supervision/safety General ADL Comments: Patient doing well with ADLs. Wife will A as needed. Verbal education on tub transfer technique.       Vision                     Perception     Praxis      Cognition   Behavior During Therapy: WFL for tasks assessed/performed Overall Cognitive Status: Within Functional Limits for tasks assessed                       Extremity/Trunk Assessment               Exercises    Shoulder Instructions       General Comments      Pertinent Vitals/ Pain       No c/o pain  Home Living                                          Prior Functioning/Environment              Frequency       Progress Toward Goals  OT Goals(current goals can now be found in the  care plan section)  Progress towards OT goals: Goals met/education completed, patient discharged from OT  Acute Rehab OT Goals Patient Stated Goal: regain independence. less pain  Plan All goals met and education completed, patient discharged from OT services    Co-evaluation                 End of Session Equipment Utilized During Treatment: Rolling walker   Activity Tolerance Patient tolerated treatment well   Patient Left in bed;with call bell/phone within reach   Nurse Communication          Time: 4142-3953 OT Time Calculation (min): 9 min  Charges: OT General Charges $OT Visit: 1 Procedure OT Treatments $Self Care/Home Management : 8-22 mins  Gabriel Anderson A Twilla Khouri 01/01/2014, 10:47 AM

## 2014-01-01 NOTE — Progress Notes (Signed)
Subjective: Patient doing well, has been ambulating in hall, pain controlled   Objective: Vital signs in last 24 hours: Temp:  [98 F (36.7 C)-100.1 F (37.8 C)] 100.1 F (37.8 C) (04/26 0645) Pulse Rate:  [71-91] 91 (04/26 0645) Resp:  [16-19] 16 (04/26 0645) BP: (122-152)/(58-76) 152/58 mmHg (04/26 0645) SpO2:  [96 %] 96 % (04/26 0645)  Intake/Output from previous day: 04/25 0701 - 04/26 0700 In: 480 [P.O.:480] Out: 1600 [Urine:1600] Intake/Output this shift: Total I/O In: 240 [P.O.:240] Out: 225 [Urine:225]  Exam:  Intact pulses distally Dorsiflexion/Plantar flexion intact  Labs:  Recent Labs  12/31/13 0525 01/01/14 0549  HGB 10.5* 8.9*    Recent Labs  12/31/13 0525 01/01/14 0549  WBC 8.3 6.7  RBC 3.48* 3.01*  HCT 31.1* 27.1*  PLT 224 171    Recent Labs  12/31/13 0525  NA 136*  K 4.0  CL 101  CO2 28  BUN 10  CREATININE 0.97  GLUCOSE 130*  CALCIUM 8.0*   No results found for this basename: LABPT, INR,  in the last 72 hours  Assessment/Plan: Patient doing well, possible dc today after pt, rx on chart   Gabriel Anderson 01/01/2014, 9:36 AM

## 2014-01-01 NOTE — Progress Notes (Signed)
Patient discharged via wheelchair with family.

## 2014-01-02 ENCOUNTER — Encounter (HOSPITAL_COMMUNITY): Payer: Self-pay | Admitting: Orthopaedic Surgery

## 2014-01-02 NOTE — Discharge Summary (Signed)
Patient ID: Gabriel Anderson MRN: [id number] DOB/AGE: 1970-11-17 43 y.o.  Admit date: 12/30/2013 Discharge date: 01/02/2014  Admission Diagnoses:  Principal Problem:   Arthritis of left hip Active Problems:   Status post THR (total hip replacement)   Discharge Diagnoses:  Same  Past Medical History  Diagnosis Date  . Coronary atherosclerosis of native coronary artery 05/21/2012    Cardiac catheterization 05/21/12-95% mid LAD lesion at the bifurcation of a large diagonal branch. Normal ejection fraction.  . Raynaud's disease   . Hyperlipidemia   . Acute myocardial infarction, subendocardial infarction, initial episode of care 2013    Cardiac catheterization 06/02/12-widely patent previously placed LAD stents. Troponin peaked 3. Normal EF.  [name] Arthritis   . Vasospasm     takes imdur for    Surgeries: Procedure(s): LEFT TOTAL HIP ARTHROPLASTY ANTERIOR APPROACH on 12/30/2013   Consultants:    Discharged Condition: Improved  Hospital Course: Gabriel Anderson is an 43 y.o. male who was admitted 12/30/2013 for operative treatment ofArthritis of left hip. Patient has severe unremitting pain that affects sleep, daily activities, and work/hobbies. After pre-op clearance the patient was taken to the operating room on 12/30/2013 and underwent  Procedure(s): LEFT TOTAL HIP ARTHROPLASTY ANTERIOR APPROACH.    Patient was given perioperative antibiotics: Anti-infectives   Start     Dose/Rate Route Frequency Ordered Stop   12/30/13 1600  ceFAZolin (ANCEF) IVPB 1 g/50 mL premix     1 g 100 mL/hr over 30 Minutes Intravenous Every 6 hours 12/30/13 1346 12/30/13 2249   12/30/13 0728  ceFAZolin (ANCEF) IVPB 2 g/50 mL premix     2 g 100 mL/hr over 30 Minutes Intravenous On call to O.R. 12/30/13 0728 12/30/13 1016       Patient was given sequential compression devices, early ambulation, and chemoprophylaxis to prevent DVT.  Patient benefited maximally from hospital stay and there were no  complications.    Recent vital signs: No data found.    Recent laboratory studies:  Recent Labs  12/31/13 0525 01/01/14 0549  WBC 8.3 6.7  HGB 10.5* 8.9*  HCT 31.1* 27.1*  PLT 224 171  NA 136*  --   K 4.0  --   CL 101  --   CO2 28  --   BUN 10  --   CREATININE 0.97  --   GLUCOSE 130*  --   CALCIUM 8.0*  --      Discharge Medications:     Medication List    STOP taking these medications       acetaminophen-codeine 300-30 MG per tablet  Commonly known as:  TYLENOL #3     ibuprofen 800 MG tablet  Commonly known as:  ADVIL,MOTRIN     naproxen sodium 220 MG tablet  Commonly known as:  ANAPROX      TAKE these medications       aspirin 325 MG EC tablet  Take 1 tablet (325 mg total) by mouth 2 (two) times daily after a meal.     atorvastatin 40 MG tablet  Commonly known as:  LIPITOR  Take 1 tablet (40 mg total) by mouth daily.     isosorbide mononitrate 30 MG 24 hr tablet  Commonly known as:  IMDUR  Take 30 mg by mouth every morning.     methocarbamol 500 MG tablet  Commonly known as:  ROBAXIN  Take 1 tablet (500 mg total) by mouth every 6 (six) hours as needed for muscle spasms.  nitroGLYCERIN 0.4 MG SL tablet  Commonly known as:  NITROSTAT  Place 1 tablet (0.4 mg total) under the tongue every 5 (five) minutes as needed.     oxyCODONE-acetaminophen 5-325 MG per tablet  Commonly known as:  ROXICET  Take 1-2 tablets by mouth every 4 (four) hours as needed for severe pain.        Diagnostic Studies: Dg Chest 2 View  12/23/2013   CLINICAL DATA:  Preoperative evaluation for hip surgery  EXAM: CHEST  2 VIEW  COMPARISON:  06/01/2012  FINDINGS: The heart size and mediastinal contours are within normal limits. Both lungs are clear. The visualized skeletal structures are unremarkable.  IMPRESSION: No active cardiopulmonary disease.   Electronically Signed   By: Alcide CleverMark  Lukens M.D.   On: 12/23/2013 10:04   Dg Hip Complete Left  12/30/2013   CLINICAL DATA:  Left  anterior approach Total hip replacement  EXAM: LEFT HIP - COMPLETE 2+ VIEW; DG C-ARM 1-60 MIN - NRPT [facility]  COMPARISON:  None.  FINDINGS: Left hip replacement in satisfactory position and alignment in the AP projection. No fracture or immediate complication.  IMPRESSION: Satisfactory left hip replacement.   Electronically Signed   By: Marlan Palauharles  Clark M.D.   On: 12/30/2013 11:45   Dg Pelvis Portable  12/30/2013   CLINICAL DATA:  Postop left total hip replacement.  EXAM: PORTABLE PELVIS 1-2 VIEWS  COMPARISON:  Intraoperative views same date.  FINDINGS: Patient is status post left total hip arthroplasty with a screw fixed acetabular component. The hardware appears well positioned. There is no evidence of acute fracture or dislocation. Soft tissue emphysema is noted lateral to the left hip.  IMPRESSION: No demonstrated complication following left total hip arthroplasty.   Electronically Signed   By: Roxy HorsemanBill  Veazey M.D.   On: 12/30/2013 12:41   Dg Hip Portable 1 View Left  12/30/2013   CLINICAL DATA:  Postop left total hip  EXAM: PORTABLE LEFT HIP - 1 VIEW  COMPARISON:  Intraoperative fluoroscopic images dated 12/30/2013 at 1030 hr  FINDINGS: Left total hip arthroplasty in satisfactory position on this single lateral view.  Associated subcutaneous gas.  No fracture or dislocation is seen.  IMPRESSION: Left total hip arthroplasty in satisfactory position.   Electronically Signed   By: Charline BillsSriyesh  Krishnan M.D.   On: 12/30/2013 12:49   Dg C-arm 1-60 Min-no Report  12/30/2013   CLINICAL DATA:  Left anterior approach Total hip replacement  EXAM: LEFT HIP - COMPLETE 2+ VIEW; DG C-ARM 1-60 MIN - NRPT [facility]  COMPARISON:  None.  FINDINGS: Left hip replacement in satisfactory position and alignment in the AP projection. No fracture or immediate complication.  IMPRESSION: Satisfactory left hip replacement.   Electronically Signed   By: Marlan Palauharles  Clark M.D.   On: 12/30/2013 11:45    Disposition: 01-Home or Self Care       Discharge Orders   Future Orders Complete By Expires   Call MD / Call 911  As directed    Call MD / Call 911  As directed    Constipation Prevention  As directed    Constipation Prevention  As directed    Diet - low sodium heart healthy  As directed    Diet - low sodium heart healthy  As directed    Discharge instructions  As directed    Increase activity slowly as tolerated  As directed    Increase activity slowly as tolerated  As directed  Follow-up Information   Follow up with Kathryne HitchBLACKMAN,Adarian Bur Y, MD In 2 weeks.   Specialty:  Orthopedic Surgery   Contact information:   [address] [place] [address] [phone]       [facility]. [facility](Home Health Physical Therapy)    Contact information:   [address] [place] [address] [phone]        Signed: Kathryne HitchChristopher Y Jaine Estabrooks 01/02/2014, 7:15 AM

## 2014-01-02 NOTE — Progress Notes (Signed)
Utilization review completed.  

## 2014-01-03 NOTE — Progress Notes (Signed)
Additional clinical information sent to insurance company at their request for April 25.

## 2014-08-17 ENCOUNTER — Encounter (HOSPITAL_COMMUNITY): Payer: Self-pay | Admitting: Interventional Cardiology

## 2014-10-11 ENCOUNTER — Other Ambulatory Visit: Payer: Self-pay | Admitting: Cardiology

## 2015-01-11 ENCOUNTER — Other Ambulatory Visit: Payer: Self-pay | Admitting: Cardiology

## 2015-02-12 ENCOUNTER — Encounter: Payer: Self-pay | Admitting: Cardiology

## 2015-02-12 ENCOUNTER — Ambulatory Visit (INDEPENDENT_AMBULATORY_CARE_PROVIDER_SITE_OTHER): Payer: 59 | Admitting: Cardiology

## 2015-02-12 VITALS — BP 134/86 | HR 66 | Ht 70.0 in | Wt 228.8 lb

## 2015-02-12 DIAGNOSIS — I2510 Atherosclerotic heart disease of native coronary artery without angina pectoris: Secondary | ICD-10-CM

## 2015-02-12 DIAGNOSIS — E7800 Pure hypercholesterolemia, unspecified: Secondary | ICD-10-CM

## 2015-02-12 DIAGNOSIS — I10 Essential (primary) hypertension: Secondary | ICD-10-CM

## 2015-02-12 DIAGNOSIS — I2583 Coronary atherosclerosis due to lipid rich plaque: Secondary | ICD-10-CM

## 2015-02-12 DIAGNOSIS — D649 Anemia, unspecified: Secondary | ICD-10-CM | POA: Insufficient documentation

## 2015-02-12 DIAGNOSIS — Z79899 Other long term (current) drug therapy: Secondary | ICD-10-CM

## 2015-02-12 LAB — ALT: ALT: 29 U/L (ref 0–53)

## 2015-02-12 LAB — CBC
HCT: 45.9 % (ref 39.0–52.0)
Hemoglobin: 15.1 g/dL (ref 13.0–17.0)
MCHC: 33 g/dL (ref 30.0–36.0)
MCV: 89.5 fl (ref 78.0–100.0)
Platelets: 297 10*3/uL (ref 150.0–400.0)
RBC: 5.13 Mil/uL (ref 4.22–5.81)
RDW: 13.6 % (ref 11.5–15.5)
WBC: 8.5 10*3/uL (ref 4.0–10.5)

## 2015-02-12 LAB — LIPID PANEL
Cholesterol: 128 mg/dL (ref 0–200)
HDL: 37.2 mg/dL — ABNORMAL LOW (ref >39.00)
LDL Cholesterol: 75 mg/dL (ref 0–99)
NonHDL: 90.8
Total CHOL/HDL Ratio: 3
Triglycerides: 80 mg/dL (ref 0.0–149.0)
VLDL: 16 mg/dL (ref 0.0–40.0)

## 2015-02-12 NOTE — Progress Notes (Signed)
[address] [place], [place]  [address] Phone: [phone] Fax:  [phone]  Date:  02/12/2015   ID:  Gabriel Anderson, DOB 1970/12/01, MRN [id number]  PCP:  No primary care provider on file.   History of Present Illness: Gabriel Anderson is a 44 y.o. male with hx of coronary artery disease ,status post DES to proximal LAD x2, on 05/21/12 with normal ejection fraction who was readmitted with chest pressure, with elevated troponin of 3. He was brought back to the catheterization lab and was noted to have patent stents as well as patent large diagonal branch which was jailed by the stent. He does have chronic diffuse disease in the septal branches that may have caused elevation in troponin and discomfort. He is on Isosorbide initially a half tablet due to headaches, which was returned to a full tablet on 06/29/12 due to periods of chest discomfort.  On 07/14/12 after recurrent chest pain he was started on Ranexa and started feeling better. He is being seen today per request of cardiac rehab after recurrent C/P while exercising. His BP went up to 190's. He states that when he first went on the Ranexa he felt so much better but now the episodes of angina are happening more frequently and are lasting longer. He has not taken any NTG, at times the mild chest discomfort may last up to 20 minutes, and radiate into jaw, occurs unrelated to exertion, not associated with other symptoms. NO GI complaints, he is not using the Zantac nor his lorazepam because he felt it has not helped his anxiety.   10/27/12 - Ranexa  has helped out significantly. Feels better. Much less CP. Still has rare episodes, 2 times a week. Much better. Used NTG only 2 times. No SOB. Exercising daily. Wrestling with 2 boys.   6/14 - only on imdur now. Now off of Ranexa, cost. Has occasional CP, SSCP, no radiation, no SOB. Had to take 2 ntg. HA gone. 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 Discussed  Lipitor. [name]  He states he also has coldness and Raynaud type symptoms with his fingers.  09/19/13 - Weight increased. Bad diet. 243 at most. Up and down entire life. More Etoh. Has not taken Lipitor since 10/14 - no change in joint pain. Will go back on it. Gabriel Anderson wife. Imdur working well. Therapist has helped as well.   02/12/15 - missed Imdur once, had CP. HA, now used to it. Good job with weight loss. Last April left hip replacement [name]. Previous anemia noted in 2015. Hemoglobin 8.9. Repeating. Energy has been much better. 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  Wt Readings from Last 3 Encounters:  02/12/15 228 lb 12.8 oz (103.783 kg)  12/30/13 232 lb 9.6 oz (105.507 kg)  12/23/13 232 lb 9.6 oz (105.507 kg)     Past Medical History  Diagnosis Date  . Coronary atherosclerosis of native coronary artery 05/21/2012    Cardiac catheterization 05/21/12-95% mid LAD lesion at the bifurcation of a large diagonal branch. Normal ejection fraction.  . Raynaud's disease   . Hyperlipidemia   . Acute myocardial infarction, subendocardial infarction, initial episode of care 2013    Cardiac catheterization 06/02/12-widely patent previously placed LAD stents. Troponin peaked 3. Normal EF.  [name] Arthritis   . Vasospasm     takes imdur for    Past Surgical History  Procedure Laterality Date  . Rotator cuff repair Right 2009  .  Elbow surgery Left 1984  . Cardiac catheterization  05/2012    PCI LAD at takeoff of large diagonal  .  cardiacstent x 2  2013  . Total hip arthroplasty Left 12/30/2013    Procedure: LEFT TOTAL HIP ARTHROPLASTY ANTERIOR APPROACH;  Surgeon: Kathryne Hitchhristopher Y Blackman, MD;  Location: [facility];  Service: Orthopedics;  Laterality: Left;  . Left heart catheterization with coronary angiogram N/A 05/21/2012    Procedure: LEFT HEART CATHETERIZATION WITH CORONARY ANGIOGRAM;  Surgeon: Corky CraftsJayadeep S Varanasi, MD;  [facility];  Service: Cardiovascular;   Laterality: N/A;  . Percutaneous coronary stent intervention (pci-s)  05/21/2012    Procedure: PERCUTANEOUS CORONARY STENT INTERVENTION (PCI-S);  Surgeon: Corky CraftsJayadeep S Varanasi, MD;  [facility];  Service: Cardiovascular;;  . Left heart catheterization with coronary angiogram N/A 06/02/2012    Procedure: LEFT HEART CATHETERIZATION WITH CORONARY ANGIOGRAM;  Surgeon: Donato SchultzMark Skains, MD;  [facility];  Service: Cardiovascular;  Laterality: N/A;    Current Outpatient Prescriptions  Medication Sig Dispense Refill  . aspirin EC 325 MG EC tablet Take 1 tablet (325 mg total) by mouth 2 (two) times daily after a meal. 30 tablet 0  . atorvastatin (LIPITOR) 40 MG tablet TAKE 1 TABLET DAILY 30 tablet 0  . isosorbide mononitrate (IMDUR) 30 MG 24 hr tablet TAKE 1 TABLET DAILY 30 tablet 0  . nitroGLYCERIN (NITROSTAT) 0.4 MG SL tablet Place 1 tablet (0.4 mg total) under the tongue every 5 (five) minutes as needed. 100 tablet 1   No current facility-administered medications for this visit.    Allergies:   No Known Allergies  Social History:  The patient  reports that he has never smoked. He has never used smokeless tobacco. He reports that he drinks alcohol. He reports that he uses illicit drugs (Marijuana).   ROS:  Please see the history of present illness.   Denies any bleeding, syncope, orthopnea, PND . +Hip arthritis.   PHYSICAL EXAM: VS:  BP 134/86 mmHg  Pulse 66  Ht 5\' 10"  (1.778 m)  Wt 228 lb 12.8 oz (103.783 kg)  BMI 32.83 kg/m2 Well nourished, well developed, in no acute distress HEENT: normal Neck: no JVD Cardiac:  normal S1, S2; RRR; no murmur Lungs:  clear to auscultation bilaterally, no wheezing, rhonchi or rales Abd: soft, nontender, no hepatomegaly Ext: no edema, spoon nails Skin: warm and dry Neuro: no focal abnormalities noted  EKG: Today 02/12/15-sinus rhythm, no other abnormalities Sinus rhythm rate 65 with no other abnormalities    ASSESSMENT AND  PLAN:  1. Coronary artery disease-stable, no worsening anginal symptoms. Continuing with isosorbide. Exercise better post hip replacement. Only had to use NTG once.  Motivated for weight loss. LAD stents 2013 2. Obesity-motivated to lose weight. Alcohol decrease, 4-6 beers daily at times. We discussed at length. Moderation 3. Hypertension-normal today. Continue to monitor. 4. Hyperlipidemia-checking lipid panel today, he is back on atorvastatin. Expressed benefits from a secondary prevention standpoint.. 5. Anemia-on 01/01/14 his hemoglobin was 8.9. We will repeat. If still abnormal, I will ask him to visit with internal medicine. 6. We will see back in 12 months   Signed, Donato SchultzMark Skains, MD [facility]  02/12/2015 8:56 AM

## 2015-02-12 NOTE — Addendum Note (Signed)
Addended by: Tonita PhoenixBOWDEN, Khyleigh Furney K on: 02/12/2015 09:09 AM   Modules accepted: Orders

## 2015-02-12 NOTE — Patient Instructions (Addendum)
Medication Instructions:  Your physician recommends that you continue on your current medications as directed. Please refer to the Current Medication list given to you today.  Labwork: Please have blood work today (CBC, Lipid and ALT)  Testing/Procedures:  Follow-Up: Follow up in 1 year with Dr. Anne FuSkains.  You will receive a letter in the mail 2 months before you are due.  Please call us when you receive this letter to schedule your follow up appointment.  Thank you for choosing [facility]!!

## 2015-02-15 ENCOUNTER — Other Ambulatory Visit: Payer: Self-pay | Admitting: *Deleted

## 2015-02-15 ENCOUNTER — Other Ambulatory Visit: Payer: Self-pay | Admitting: Cardiology

## 2015-02-15 MED ORDER — ISOSORBIDE MONONITRATE ER 30 MG PO TB24: 30.0000 mg | ORAL_TABLET | Freq: Every day | ORAL | Status: DC

## 2015-02-15 MED ORDER — ATORVASTATIN CALCIUM 40 MG PO TABS: 40.0000 mg | ORAL_TABLET | Freq: Every day | ORAL | Status: DC

## 2016-04-09 ENCOUNTER — Encounter: Payer: Self-pay | Admitting: Cardiology

## 2016-04-09 ENCOUNTER — Ambulatory Visit (INDEPENDENT_AMBULATORY_CARE_PROVIDER_SITE_OTHER): Payer: PRIVATE HEALTH INSURANCE | Admitting: Cardiology

## 2016-04-09 VITALS — BP 122/82 | HR 62 | Ht 70.0 in | Wt 238.1 lb

## 2016-04-09 DIAGNOSIS — I2510 Atherosclerotic heart disease of native coronary artery without angina pectoris: Secondary | ICD-10-CM | POA: Diagnosis not present

## 2016-04-09 DIAGNOSIS — I2583 Coronary atherosclerosis due to lipid rich plaque: Principal | ICD-10-CM

## 2016-04-09 DIAGNOSIS — E7800 Pure hypercholesterolemia, unspecified: Secondary | ICD-10-CM | POA: Diagnosis not present

## 2016-04-09 DIAGNOSIS — I10 Essential (primary) hypertension: Secondary | ICD-10-CM

## 2016-04-09 MED ORDER — ASPIRIN EC 81 MG PO TBEC: 81.0000 mg | DELAYED_RELEASE_TABLET | Freq: Every day | ORAL | Status: AC

## 2016-04-09 MED ORDER — ATORVASTATIN CALCIUM 40 MG PO TABS: 40.0000 mg | ORAL_TABLET | Freq: Every day | ORAL | 3 refills | Status: DC

## 2016-04-09 NOTE — Patient Instructions (Signed)
Medication Instructions:  1. STOP IMDUR  2.  RE START LIPITOR 40 MG DAILY; RX SENT   3. CHANGE ASPIRIN TO 81 MG ONCE A DAY  Labwork: NONE  Testing/Procedures: NOONE  Follow-Up: Your physician wants you to follow-up in: 1 YEAR WITH DR, Anne Fu. You will receive a reminder letter in the mail two months in advance. If you don't receive a letter, please call our office to schedule the follow-up appointment.   Any Other Special Instructions Will Be Listed Below (If Applicable).     If you need a refill on your cardiac medications before your next appointment, please call your pharmacy.

## 2016-04-09 NOTE — Progress Notes (Signed)
[place], [address] [place], [place]  [address] Phone: [phone] Fax:  [phone]  Date:  04/09/2016   ID:  Gabriel Anderson, DOB 10/21/1970, MRN [id number]  PCP:  No primary care provider on file.   History of Present Illness: Gabriel Anderson is a 45 y.o. male with hx of coronary artery disease ,status post DES to proximal LAD x2, on 05/21/12 with normal ejection fraction who was readmitted with chest pressure, with elevated troponin of 3. He was brought back to the catheterization lab and was noted to have patent stents as well as patent large diagonal branch which was jailed by the stent. He does have chronic diffuse disease in the septal branches that may have caused elevation in troponin and discomfort. He is on Isosorbide initially a half tablet due to headaches, which was returned to a full tablet on 06/29/12 due to periods of chest discomfort.   On 07/14/12 after recurrent chest pain he was started on Ranexa and started feeling better. He is being seen today per request of cardiac rehab after recurrent C/P while exercising. His BP went up to 190's. He states that when he first went on the Ranexa he felt so much better but now the episodes of angina are happening more frequently and are lasting longer. He has not taken any NTG, at times the mild chest discomfort may last up to 20 minutes, and radiate into jaw, occurs unrelated to exertion, not associated with other symptoms. NO GI complaints, he is not using the Zantac nor his lorazepam because he felt it has not helped his anxiety.   10/27/12 - Ranexa 1072m has helped out significantly. Feels better. Much less CP. Still has rare episodes, 2 times a week. Much better. Used NTG only 2 times. No SOB. Exercising daily. Wrestling with 2 boys.   6/14 - only on imdur now. Now off of Ranexa, cost. Has occasional CP, SSCP, no radiation, no SOB. Had to take 2 ntg. HA gone. 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 Discussed  Lipitor. .Gabriel Anderson He states he also has coldness and Raynaud type symptoms with his fingers.  09/19/13 - Weight increased. Bad diet. 243 at most. Up and down entire life. More Etoh. Has not taken Lipitor since 10/14 - no change in joint pain. Will go back on it. [name] wife. Imdur working well. Therapist has helped as well.   02/12/15 - missed Imdur once, had CP. HA, now used to it. Good job with weight loss. Last April left hip replacement (Gabriel Anderson. Previous anemia noted in 2015. Hemoglobin 8.9. Repeating. Energy has been much better. 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  04/09/16 - ran out of atorvastatin and Imdur 2 months ago. Knows MRayford Halstedmet LLars Pinksat Christmas party. Overall he is doing well. He has not been on the isosorbide for quite some time and is doing well. I'm comfortable with him being off of this medication at this point. We discussed weight loss once again. No chest pain, no shortness of breath. No syncope, no bleeding.  Wt Readings from Last 3 Encounters:  04/09/16 238 lb 1.9 oz (108 kg)  02/12/15 228 lb 12.8 oz (103.8 kg)  12/30/13 232 lb 9.6 oz (105.5 kg)     Past Medical History:  Diagnosis Date  . Acute myocardial infarction, subendocardial infarction, initial episode of care ([facility] 2013   Cardiac catheterization 06/02/12-widely patent previously placed LAD stents. Troponin peaked 3.  Normal EF.  Gabriel Anderson Arthritis   . Coronary atherosclerosis of native coronary artery 05/21/2012   Cardiac catheterization 05/21/12-95% mid LAD lesion at the bifurcation of a large diagonal branch. Normal ejection fraction.  . Hyperlipidemia   . Raynaud's disease   . Vasospasm ([place])    takes imdur for    Past Surgical History:  Procedure Laterality Date  .  cardiacstent x 2  2013  . CARDIAC CATHETERIZATION  05/2012   PCI LAD at takeoff of large diagonal  . ELBOW SURGERY Left 1984  . LEFT HEART CATHETERIZATION WITH CORONARY ANGIOGRAM N/A 05/21/2012    Procedure: LEFT HEART CATHETERIZATION WITH CORONARY ANGIOGRAM;  Surgeon: Jettie Booze, MD;  Location: [facility] CATH LAB;  Service: Cardiovascular;  Laterality: N/A;  . LEFT HEART CATHETERIZATION WITH CORONARY ANGIOGRAM N/A 06/02/2012   Procedure: LEFT HEART CATHETERIZATION WITH CORONARY ANGIOGRAM;  Surgeon: Candee Furbish, MD;  Location: [facility] C[facility] LAB;  Service: Cardiovascular;  Laterality: N/A;  . PERCUTANEOUS CORONARY STENT INTERVENTION (PCI-S)  05/21/2012   Procedure: PERCUTANEOUS CORONARY STENT INTERVENTION (PCI-S);  Surgeon: Jettie Booze, MD;  Location: [facility] LAB;  Service: Cardiovascular;;  . ROTATOR CUFF REPAIR Right 2009  . TOTAL HIP ARTHROPLASTY Left 12/30/2013   Procedure: LEFT TOTAL HIP ARTHROPLASTY ANTERIOR APPROACH;  Surgeon: Mcarthur Rossetti, MD;  Location: [facility];  Service: Orthopedics;  Laterality: Left;    Current Outpatient Prescriptions  Medication Sig Dispense Refill  . aspirin 81 MG tablet Take 1 tablet (81 mg total) by mouth daily.    . nitroGLYCERIN (NITROSTAT) 0.4 MG SL tablet Place 1 tablet (0.4 mg total) under the tongue every 5 (five) minutes as needed. 100 tablet 1  . atorvastatin (LIPITOR) 40 MG tablet Take 1 tablet (40 mg total) by mouth daily. 90 tablet 3   No current facility-administered medications for this visit.     Allergies:   No Known Allergies  Social History:  The patient  reports that he has never smoked. He has never used smokeless tobacco. He reports that he drinks alcohol. He reports that he uses drugs, including Marijuana.   ROS:  Please see the history of present illness.   Denies any bleeding, syncope, orthopnea, PND . +Hip arthritis.   PHYSICAL EXAM: VS:  BP 122/82   Pulse 62   Ht _0  (1.778 m)   Wt 238 lb 1.9 oz (108 kg)   BMI 34.17 kg/m  Well nourished, well developed, in no acute distress  HEENT: normal  Neck: no JVD  Cardiac:  normal S1, S2; RRR; no murmur  Lungs:  clear to auscultation bilaterally, no wheezing,  rhonchi or rales  Abd: soft, nontender, no hepatomegaly  Ext: no edema, spoon nails  Skin: warm and dry  Neuro: no focal abnormalities noted  EKG: Today 04/09/16-sinus rhythm, 62, no other abnormalities. Personally viewed-prior 02/12/15-sinus rhythm, no other abnormalities Sinus rhythm rate 65 with no other abnormalities  LDL 75 on 02/12/15  ASSESSMENT AND PLAN:  1. Coronary artery disease-stable, no worsening anginal symptoms. Has been off of isosorbide for proximally 2 months and is doing well. Initially had some headache when he stopped it. We will see how he does without. Exercise better post hip replacement. Only had to use NTG once.  Motivated for weight loss. LAD stents 2013. Continue aspirin 81 mg. 2. Obesity-motivated to lose weight. Alcohol decrease, 4-6 beers daily at times. We discussed at length. Moderation 3. Hypertension-normal today. Continue to monitor. 4. Hyperlipidemia-stopped atorvastatin for 2 months-did not have the  opportunity to get a refill. Expressed benefits from a secondary prevention standpoint. We will replaced 40 mg. 5. We will see back in 12 months   Signed, Candee Furbish, MD [facility]  04/09/2016 10:05 AM

## 2016-05-13 ENCOUNTER — Other Ambulatory Visit: Payer: Self-pay | Admitting: Cardiology

## 2017-08-05 ENCOUNTER — Other Ambulatory Visit: Payer: Self-pay | Admitting: Cardiology

## 2017-08-05 DIAGNOSIS — E7800 Pure hypercholesterolemia, unspecified: Secondary | ICD-10-CM

## 2017-08-19 ENCOUNTER — Other Ambulatory Visit: Payer: Self-pay | Admitting: Cardiology

## 2017-08-19 DIAGNOSIS — E7800 Pure hypercholesterolemia, unspecified: Secondary | ICD-10-CM

## 2017-09-05 ENCOUNTER — Other Ambulatory Visit: Payer: Self-pay | Admitting: Cardiology

## 2017-09-05 DIAGNOSIS — E7800 Pure hypercholesterolemia, unspecified: Secondary | ICD-10-CM

## 2017-09-21 ENCOUNTER — Other Ambulatory Visit: Payer: Self-pay | Admitting: Cardiology

## 2017-09-21 DIAGNOSIS — E7800 Pure hypercholesterolemia, unspecified: Secondary | ICD-10-CM

## 2017-09-21 MED ORDER — ATORVASTATIN CALCIUM 40 MG PO TABS
ORAL_TABLET | ORAL | 0 refills | Status: DC
Start: 2017-09-21 — End: 2017-10-20

## 2017-10-20 ENCOUNTER — Ambulatory Visit (INDEPENDENT_AMBULATORY_CARE_PROVIDER_SITE_OTHER): Payer: 59 | Admitting: Cardiology

## 2017-10-20 ENCOUNTER — Encounter: Payer: Self-pay | Admitting: Cardiology

## 2017-10-20 VITALS — BP 144/86 | HR 61 | Ht 70.0 in | Wt 252.0 lb

## 2017-10-20 DIAGNOSIS — E7800 Pure hypercholesterolemia, unspecified: Secondary | ICD-10-CM | POA: Diagnosis not present

## 2017-10-20 DIAGNOSIS — I2583 Coronary atherosclerosis due to lipid rich plaque: Secondary | ICD-10-CM

## 2017-10-20 DIAGNOSIS — I10 Essential (primary) hypertension: Secondary | ICD-10-CM | POA: Diagnosis not present

## 2017-10-20 DIAGNOSIS — I2510 Atherosclerotic heart disease of native coronary artery without angina pectoris: Secondary | ICD-10-CM | POA: Diagnosis not present

## 2017-10-20 DIAGNOSIS — Z79899 Other long term (current) drug therapy: Secondary | ICD-10-CM | POA: Diagnosis not present

## 2017-10-20 LAB — LIPID PANEL
CHOLESTEROL TOTAL: 198 mg/dL (ref 100–199)
Chol/HDL Ratio: 5.2 ratio — ABNORMAL HIGH (ref 0.0–5.0)
HDL: 38 mg/dL — ABNORMAL LOW (ref >39)
LDL CALC: 138 mg/dL — AB (ref 0–99)
Triglycerides: 108 mg/dL (ref 0–149)
VLDL CHOLESTEROL CAL: 22 mg/dL (ref 5–40)

## 2017-10-20 LAB — ALT: ALT: 27 IU/L (ref 0–44)

## 2017-10-20 MED ORDER — NITROGLYCERIN 0.4 MG SL SUBL: 0.4000 mg | SUBLINGUAL_TABLET | SUBLINGUAL | 6 refills | Status: DC | PRN

## 2017-10-20 MED ORDER — ATORVASTATIN CALCIUM 40 MG PO TABS: ORAL_TABLET | ORAL | 3 refills | Status: DC

## 2017-10-20 NOTE — Progress Notes (Signed)
[place]. [address] [place], [place]  [address] Phone: [phone] Fax:  [phone]  Date:  10/20/2017   ID:  Gabriel Anderson, DOB November 08, 1970, MRN [id number]  PCP:  Patient, No Pcp Per   History of Present Illness: Gabriel Anderson is a 47 y.o. male with hx of coronary artery disease ,status post DES to proximal LAD x2, on 05/21/12 with normal ejection fraction who was readmitted with chest pressure, with elevated troponin of 3. He was brought back to the catheterization lab and was noted to have patent stents as well as patent large diagonal branch which was jailed by the stent. He does have chronic diffuse disease in the septal branches that may have caused elevation in troponin and discomfort. He is on Isosorbide initially a half tablet due to headaches, which was returned to a full tablet on 06/29/12 due to periods of chest discomfort.   On 07/14/12 after recurrent chest pain he was started on Ranexa and started feeling better. He is being seen today per request of cardiac rehab after recurrent C/P while exercising. His BP went up to 190's. He states that when he first went on the Ranexa he felt so much better but now the episodes of angina are happening more frequently and are lasting longer. He has not taken any NTG, at times the mild chest discomfort may last up to 20 minutes, and radiate into jaw, occurs unrelated to exertion, not associated with other symptoms. NO GI complaints, he is not using the Zantac nor his lorazepam because he felt it has not helped his anxiety.   10/27/12 - Ranexa 1038m has helped out significantly. Feels better. Much less CP. Still has rare episodes, 2 times a week. Much better. Used NTG only 2 times. No SOB. Exercising daily. Wrestling with 2 boys.   6/14 - only on imdur now. Now off of Ranexa, cost. Has occasional CP, SSCP, no radiation, no SOB. Had to take 2 ntg. HA gone. 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 Discussed Lipitor. .[name] He  states he also has coldness and Raynaud type symptoms with his fingers.  09/19/13 - Weight increased. Bad diet. 243 at most. Up and down entire life. More Etoh. Has not taken Lipitor since 10/14 - no change in joint pain. Will go back on it. [name] wife. Imdur working well. Therapist has helped as well.   02/12/15 - missed Imdur once, had CP. HA, now used to it. Good job with weight loss. Last April left hip replacement ([name]. Previous anemia noted in 2015. Hemoglobin 8.9. Repeating. Energy has been much better. 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  04/09/16 - ran out of atorvastatin and Imdur 2 months ago. Knows MRayford Halstedmet LLars Pinksat Christmas party. Overall he is doing well. He has not been on the isosorbide for quite some time and is doing well. I'm comfortable with him being off of this medication at this point. We discussed weight loss once again. No chest pain, no shortness of breath. No syncope, no bleeding.  10/20/17 -overall been doing well.  He has gained weight once again.  Cut back his beer during the week.  Occasional palpitations noted.  No significant change.  No anginal symptoms.  No bleeding, no syncope.  He has woken up once or twice, not recently, gasping.  Could be apnea?.  Wt Readings from Last 3 Encounters:  10/20/17 252 lb (114.3 kg)  04/09/16 238  lb 1.9 oz (108 kg)  02/12/15 228 lb 12.8 oz (103.8 kg)     Past Medical History:  Diagnosis Date  . Acute myocardial infarction, subendocardial infarction, initial episode of care [facility]) 2013   Cardiac catheterization 06/02/12-widely patent previously placed LAD stents. Troponin peaked 3. Normal EF.  [name] Arthritis   . Coronary atherosclerosis of native coronary artery 05/21/2012   Cardiac catheterization 05/21/12-95% mid LAD lesion at the bifurcation of a large diagonal branch. Normal ejection fraction.  . Hyperlipidemia   . Raynaud's disease   . Vasospasm ([place])    takes imdur for     Past Surgical History:  Procedure Laterality Date  .  cardiacstent x 2  2013  . CARDIAC CATHETERIZATION  05/2012   PCI LAD at takeoff of large diagonal  . ELBOW SURGERY Left 1984  . LEFT HEART CATHETERIZATION WITH CORONARY ANGIOGRAM N/A 05/21/2012   Procedure: LEFT HEART CATHETERIZATION WITH CORONARY ANGIOGRAM;  Surgeon: Jettie Booze, MD;  Location: [facility] CATH LAB;  Service: Cardiovascular;  Laterality: N/A;  . LEFT HEART CATHETERIZATION WITH CORONARY ANGIOGRAM N/A 06/02/2012   Procedure: LEFT HEART CATHETERIZATION WITH CORONARY ANGIOGRAM;  Surgeon: Candee Furbish, MD;  Location: [facility] CATH LAB;  Service: Cardiovascular;  Laterality: N/A;  . PERCUTANEOUS CORONARY STENT INTERVENTION (PCI-S)  05/21/2012   Procedure: PERCUTANEOUS CORONARY STENT INTERVENTION (PCI-S);  Surgeon: Jettie Booze, MD;  Location: [facility] CATH LAB;  Service: Cardiovascular;;  . ROTATOR CUFF REPAIR Right 2009  . TOTAL HIP ARTHROPLASTY Left 12/30/2013   Procedure: LEFT TOTAL HIP ARTHROPLASTY ANTERIOR APPROACH;  Surgeon: Mcarthur Rossetti, MD;  Location: [facility] ORS;  Service: Orthopedics;  Laterality: Left;    Current Outpatient Medications  Medication Sig Dispense Refill  . aspirin 81 MG tablet Take 1 tablet (81 mg total) by mouth daily.    [name] atorvastatin (LIPITOR) 40 MG tablet TAKE 1 TABLET (40 MG TOTAL) BY MOUTH DAILY. 90 tablet 3  . nitroGLYCERIN (NITROSTAT) 0.4 MG SL tablet Place 1 tablet (0.4 mg total) under the tongue every 5 (five) minutes as needed. 25 tablet 6   No current facility-administered medications for this visit.     Allergies:   No Known Allergies  Social History:  The patient  reports that  has never smoked. he has never used smokeless tobacco. He reports that he drinks alcohol. He reports that he uses drugs. Drug: Marijuana.   ROS:  Please see the history of present illness.  All others negative.    PHYSICAL EXAM: VS:  BP (!) 144/86   Pulse 61   Ht _0  (1.778 m)   Wt 252 lb (114.3 kg)    SpO2 97%   BMI 36.16 kg/m  GEN: Well nourished, well developed, in no acute distress , obese HEENT: normal  Neck: no JVD, carotid bruits, or masses Cardiac: RRR; no murmurs, rubs, or gallops,no edema  Respiratory:  clear to auscultation bilaterally, normal work of breathing GI: soft, nontender, nondistended, + BS MS: no deformity or atrophy  Skin: warm and dry, no rash Neuro:  Alert and Oriented x 3, Strength and sensation are intact Psych: euthymic mood, full affect   EKG: Today 05/20/18 - NSR personally viewed, prior8/2/17-sinus rhythm, 62, no other abnormalities. Personally viewed-prior 02/12/15-sinus rhythm, no other abnormalities Sinus rhythm rate 65 with no other abnormalities  LDL 75 on 02/12/15  ASSESSMENT AND PLAN:  1. Coronary artery disease-stable, no worsening anginal symptoms. Motivated for weight loss. LAD stents 2013. Continue aspirin 81 mg. No changes.  2.  Morbid Obesity (HTN, CAD) -motivated to lose weight. Alcohol decrease. Discussed again at length.  3. Hypertension-normal today. Continue to monitor. No changes 4. Hyperlipidemia-continue atorvastatin. Expressed benefits from a secondary prevention standpoint.  5. We will see back in 12 months   Signed, Candee Furbish, MD [facility]  10/20/2017 9:00 AM

## 2017-10-20 NOTE — Patient Instructions (Addendum)
Medication Instructions:  The current medical regimen is effective;  continue present plan and medications.  Labwork: Please have blood work today (Lipid/ALT)  Follow-Up: Follow up in 1 year with Dr. Skains.  You will receive a letter in the mail 2 months before you are due.  Please call us when you receive this letter to schedule your follow up appointment.  If you need a refill on your cardiac medications before your next appointment, please call your pharmacy.  Thank you for choosing [facility]!!      

## 2018-05-19 ENCOUNTER — Other Ambulatory Visit: Payer: Self-pay | Admitting: Cardiology

## 2018-10-26 ENCOUNTER — Ambulatory Visit (INDEPENDENT_AMBULATORY_CARE_PROVIDER_SITE_OTHER): Payer: 59 | Admitting: Cardiology

## 2018-10-26 ENCOUNTER — Encounter: Payer: Self-pay | Admitting: Cardiology

## 2018-10-26 VITALS — BP 140/88 | HR 55 | Ht 70.0 in | Wt 248.8 lb

## 2018-10-26 DIAGNOSIS — Z79899 Other long term (current) drug therapy: Secondary | ICD-10-CM | POA: Diagnosis not present

## 2018-10-26 DIAGNOSIS — I2510 Atherosclerotic heart disease of native coronary artery without angina pectoris: Secondary | ICD-10-CM | POA: Insufficient documentation

## 2018-10-26 DIAGNOSIS — Z9861 Coronary angioplasty status: Secondary | ICD-10-CM | POA: Insufficient documentation

## 2018-10-26 DIAGNOSIS — I10 Essential (primary) hypertension: Secondary | ICD-10-CM

## 2018-10-26 DIAGNOSIS — R011 Cardiac murmur, unspecified: Secondary | ICD-10-CM

## 2018-10-26 DIAGNOSIS — E7800 Pure hypercholesterolemia, unspecified: Secondary | ICD-10-CM

## 2018-10-26 DIAGNOSIS — I2583 Coronary atherosclerosis due to lipid rich plaque: Secondary | ICD-10-CM

## 2018-10-26 DIAGNOSIS — I350 Nonrheumatic aortic (valve) stenosis: Secondary | ICD-10-CM | POA: Insufficient documentation

## 2018-10-26 NOTE — Patient Instructions (Signed)
Medication Instructions:  The current medical regimen is effective;  continue present plan and medications.  If you need a refill on your cardiac medications before your next appointment, please call your pharmacy.   Lab work: Please have blood work today. (Lipid/ALT) If you have labs (blood work) drawn today and your tests are completely normal, you will receive your results only by: [name] MyChart Message (if you have MyChart) OR . A paper copy in the mail If you have any lab test that is abnormal or we need to change your treatment, we will call you to review the results.  Testing/Procedures: Your physician has requested that you have an echocardiogram. Echocardiography is a painless test that uses sound waves to create images of your heart. It provides your doctor with information about the size and shape of your heart and how well your heart's chambers and valves are working. This procedure takes approximately one hour. There are no restrictions for this procedure.  Follow-Up: At [facility], you and your health needs are our priority.  As part of our continuing mission to provide you with exceptional heart care, we have created designated Provider Care Teams.  These Care Teams include your primary Cardiologist (physician) and Advanced Practice Providers (APPs -  Physician Assistants and Nurse Practitioners) who all work together to provide you with the care you need, when you need it. You will need a follow up appointment in 12 months.  Please call our office 2 months in advance to schedule this appointment.  You may see Dr Donato Schultz. or one of the following Advanced Practice Providers on your designated Care Team:   Norma Fredrickson, NP Nada Boozer, NP . Georgie Chard, NP  Thank you for choosing [facility]!!

## 2018-10-26 NOTE — Progress Notes (Signed)
[place]. [address] [place], [place]  [address] Phone: [phone] Fax:  [phone]  Date:  10/26/2018   ID:  Gabriel Anderson, DOB 04-27-1971, MRN [id number]  PCP:  Patient, No Pcp Per   History of Present Illness: Gabriel Anderson is a 48 y.o. male with hx of coronary artery disease ,status post DES to proximal LAD x2, on 05/21/12 with normal ejection fraction who was readmitted with chest pressure, with elevated troponin of 3. He was brought back to the catheterization lab and was noted to have patent stents as well as patent large diagonal branch which was jailed by the stent. He does have chronic diffuse disease in the septal branches that may have caused elevation in troponin and discomfort. He is on Isosorbide initially a half tablet due to headaches, which was returned to a full tablet on 06/29/12 due to periods of chest discomfort.   On 07/14/12 after recurrent chest pain he was started on Ranexa and started feeling better. He is being seen today per request of cardiac rehab after recurrent C/P while exercising. His BP went up to 190's. He states that when he first went on the Ranexa he felt so much better but now the episodes of angina are happening more frequently and are lasting longer. He has not taken any NTG, at times the mild chest discomfort may last up to 20 minutes, and radiate into jaw, occurs unrelated to exertion, not associated with other symptoms. NO GI complaints, he is not using the Zantac nor his lorazepam because he felt it has not helped his anxiety.   10/27/12 - Ranexa '1000mg'$  has helped out significantly. Feels better. Much less CP. Still has rare episodes, 2 times a week. Much better. Used NTG only 2 times. No SOB. Exercising daily. Wrestling with 2 boys.   6/14 - only on imdur now. Now off of Ranexa, cost. Has occasional CP, SSCP, no radiation, no SOB. Had to take 2 ntg. HA gone. 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 Discussed Lipitor. Gabriel Anderson  He  states he also has coldness and Raynaud type symptoms with his fingers.  09/19/13 - Weight increased. Bad diet. 243 at most. Up and down entire life. More Etoh. Has not taken Lipitor since 10/14 - no change in joint pain. Will go back on it. [name] wife. Imdur working well. Therapist has helped as well.   02/12/15 - missed Imdur once, had CP. HA, now used to it. Good job with weight loss. Last April left hip replacement [name]). Previous anemia noted in 2015. Hemoglobin 8.9. Repeating. Energy has been much better. 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  04/09/16 - ran out of atorvastatin and Imdur 2 months ago. Knows Rayford Halsted met Lars Pinks at Christmas party. Overall he is doing well. He has not been on the isosorbide for quite some time and is doing well. I'm comfortable with him being off of this medication at this point. We discussed weight loss once again. No chest pain, no shortness of breath. No syncope, no bleeding.  10/20/17 -overall been doing well.  He has gained weight once again.  Cut back his beer during the week.  Occasional palpitations noted.  No significant change.  No anginal symptoms.  No bleeding, no syncope.  He has woken up once or twice, not recently, gasping.  Could be apnea?.  10/26/2018-here for the follow-up of coronary artery disease, angina, hyperlipidemia. Was 225 2 months ago.  Does describe occasional palpitations lasting few seconds duration, no high risk symptoms such as syncope.  No strokelike symptoms.  Overall he has gained some weight.  He is very motivated after his 48th birthday coming up to decrease his weight.  He states beer is a likely culprit.  He is going to decrease after the 48th birthday party, he is having this on Friday, March 13 at [facility].  Denies any myalgias.  He does state that he is now taking his atorvastatin on a daily basis.  He was missing this at last visit.  No fever chills nausea vomiting syncope  bleeding.  Wt Readings from Last 3 Encounters:  10/26/18 248 lb 12.8 oz (112.9 kg)  10/20/17 252 lb (114.3 kg)  04/09/16 238 lb 1.9 oz (108 kg)     Past Medical History:  Diagnosis Date  . Acute myocardial infarction, subendocardial infarction, initial episode of care [facility]) 2013   Cardiac catheterization 06/02/12-widely patent previously placed LAD stents. Troponin peaked 3. Normal EF.  Gabriel Anderson Arthritis   . Coronary atherosclerosis of native coronary artery 05/21/2012   Cardiac catheterization 05/21/12-95% mid LAD lesion at the bifurcation of a large diagonal branch. Normal ejection fraction.  . Hyperlipidemia   . Raynaud's disease   . Vasospasm ([place])    takes imdur for    Past Surgical History:  Procedure Laterality Date  .  cardiacstent x 2  2013  . CARDIAC CATHETERIZATION  05/2012   PCI LAD at takeoff of large diagonal  . ELBOW SURGERY Left 1984  . LEFT HEART CATHETERIZATION WITH CORONARY ANGIOGRAM N/A 05/21/2012   Procedure: LEFT HEART CATHETERIZATION WITH CORONARY ANGIOGRAM;  Surgeon: Jettie Booze, MD;  Location: [facility] CATH LAB;  Service: Cardiovascular;  Laterality: N/A;  . LEFT HEART CATHETERIZATION WITH CORONARY ANGIOGRAM N/A 06/02/2012   Procedure: LEFT HEART CATHETERIZATION WITH CORONARY ANGIOGRAM;  Surgeon: Candee Furbish, MD;  Location: [facility] CATH LAB;  Service: Cardiovascular;  Laterality: N/A;  . PERCUTANEOUS CORONARY STENT INTERVENTION (PCI-S)  05/21/2012   Procedure: PERCUTANEOUS CORONARY STENT INTERVENTION (PCI-S);  Surgeon: Jettie Booze, MD;  Location: [facility] CATH LAB;  Service: Cardiovascular;;  . ROTATOR CUFF REPAIR Right 2009  . TOTAL HIP ARTHROPLASTY Left 12/30/2013   Procedure: LEFT TOTAL HIP ARTHROPLASTY ANTERIOR APPROACH;  Surgeon: Mcarthur Rossetti, MD;  Location: [facility] ORS;  Service: Orthopedics;  Laterality: Left;    Current Outpatient Medications  Medication Sig Dispense Refill  . amoxicillin (AMOXIL) 500 MG capsule Take 500 mg by mouth as needed. Patient  reports taking 4 tablets one hour prior dental appt.    Gabriel Anderson aspirin 81 MG tablet Take 1 tablet (81 mg total) by mouth daily.    Gabriel Anderson atorvastatin (LIPITOR) 40 MG tablet TAKE 1 TABLET BY MOUTH EVERY DAY 90 tablet 1  . nitroGLYCERIN (NITROSTAT) 0.4 MG SL tablet Place 1 tablet (0.4 mg total) under the tongue every 5 (five) minutes as needed. 25 tablet 6   No current facility-administered medications for this visit.     Allergies:   No Known Allergies  Social History:  The patient  reports that he has never smoked. He has never used smokeless tobacco. He reports current alcohol use of about 3.0 - 6.0 standard drinks of alcohol per week. He reports current drug use. Drug: Marijuana.   ROS:  Please see the history of present illness.  All others negative.    PHYSICAL EXAM: VS:  BP 140/88   Pulse (!) 55   Ht '5\' 10"'$  (1.778 m)  Wt 248 lb 12.8 oz (112.9 kg)   SpO2 97%   BMI 35.70 kg/m  GEN: Well nourished, well developed, in no acute distress  HEENT: normal  Neck: no JVD, mild radiation of heart murmur to carotids, no masses Cardiac: RRR; 2/6 SM RUSB,no rubs, or gallops,no edema  Respiratory:  clear to auscultation bilaterally, normal work of breathing GI: soft, nontender, nondistended, + BS MS: no deformity or atrophy  Skin: warm and dry, no rash Neuro:  Alert and Oriented x 3, Strength and sensation are intact Psych: euthymic mood, full affect    EKG: Today 10/26/2018-sinus bradycardia 55 with no other significant abnormalities.  Personally reviewed and interpreted-05/20/18 - NSR personally viewed, prior8/2/17-sinus rhythm, 62, no other abnormalities. Personally viewed-prior 02/12/15-sinus rhythm, no other abnormalities Sinus rhythm rate 65 with no other abnormalities  LDL 75 on 02/12/15 LDL 138 on 10/20/2017, HDL 38  ASSESSMENT AND PLAN:  1. Coronary artery disease-stable, no worsening anginal symptoms. Motivated for weight loss. LAD stents 2013. Continue aspirin 81 mg. No changes. No NTG 1  year.  No longer on isosorbide.  Doing very well from an anginal perspective.  Continue with current secondary prevention. 2. Morbid Obesity- BMI greater than 35 with 2 or more comorbidities (HTN, CAD) -motivated to lose weight. Alcohol decrease. Discussed again at length.  He understands.  He also has a hip replacement.  This will be helpful for this as well.  After his 48th birthday he wants to decrease/eliminate beer use. 3. Hypertension-normal today. Continue to monitor.  Doing very well.  No medication changes. 4. Hyperlipidemia-continue atorvastatin. Expressed benefits from a secondary prevention standpoint. Now back on . OK to recheck today.  Lipid panel and ALT. 5. Heart murmur- I do not recall hearing this on prior exam.  Sounds like it is in the aortic position.  We will go ahead and check echocardiogram.  I do hear some radiation to his carotids.  No shortness of breath. 6. We will see back in 12 months   Signed, Candee Furbish, MD [facility]  10/26/2018 9:36 AM

## 2018-10-27 ENCOUNTER — Ambulatory Visit (HOSPITAL_COMMUNITY): Payer: 59 | Attending: Cardiology

## 2018-10-27 DIAGNOSIS — R011 Cardiac murmur, unspecified: Secondary | ICD-10-CM | POA: Diagnosis not present

## 2018-10-27 LAB — LIPID PANEL
CHOL/HDL RATIO: 3.4 ratio (ref 0.0–5.0)
Cholesterol, Total: 151 mg/dL (ref 100–199)
HDL: 44 mg/dL (ref >39)
LDL Calculated: 90 mg/dL (ref 0–99)
Triglycerides: 85 mg/dL (ref 0–149)
VLDL Cholesterol Cal: 17 mg/dL (ref 5–40)

## 2018-10-27 LAB — ALT: ALT: 28 IU/L (ref 0–44)

## 2018-10-27 MED ORDER — PERFLUTREN LIPID MICROSPHERE
1.0000 mL | INTRAVENOUS | Status: AC | PRN
  Administered 2018-10-27: 2 mL via INTRAVENOUS

## 2018-11-01 ENCOUNTER — Other Ambulatory Visit: Payer: Self-pay | Admitting: *Deleted

## 2018-11-01 ENCOUNTER — Other Ambulatory Visit (INDEPENDENT_AMBULATORY_CARE_PROVIDER_SITE_OTHER): Payer: 59

## 2018-11-01 ENCOUNTER — Encounter: Payer: Self-pay | Admitting: Internal Medicine

## 2018-11-01 ENCOUNTER — Telehealth: Payer: Self-pay | Admitting: Cardiology

## 2018-11-01 ENCOUNTER — Ambulatory Visit (INDEPENDENT_AMBULATORY_CARE_PROVIDER_SITE_OTHER): Payer: 59 | Admitting: Internal Medicine

## 2018-11-01 VITALS — BP 144/88 | HR 75 | Temp 98.1°F | Resp 16 | Ht 70.0 in | Wt 249.5 lb

## 2018-11-01 DIAGNOSIS — Z125 Encounter for screening for malignant neoplasm of prostate: Secondary | ICD-10-CM

## 2018-11-01 DIAGNOSIS — Z23 Encounter for immunization: Secondary | ICD-10-CM | POA: Diagnosis not present

## 2018-11-01 DIAGNOSIS — Z1211 Encounter for screening for malignant neoplasm of colon: Secondary | ICD-10-CM

## 2018-11-01 DIAGNOSIS — I10 Essential (primary) hypertension: Secondary | ICD-10-CM | POA: Diagnosis not present

## 2018-11-01 DIAGNOSIS — R739 Hyperglycemia, unspecified: Secondary | ICD-10-CM

## 2018-11-01 DIAGNOSIS — I2583 Coronary atherosclerosis due to lipid rich plaque: Secondary | ICD-10-CM

## 2018-11-01 DIAGNOSIS — I350 Nonrheumatic aortic (valve) stenosis: Secondary | ICD-10-CM

## 2018-11-01 DIAGNOSIS — E559 Vitamin D deficiency, unspecified: Secondary | ICD-10-CM | POA: Insufficient documentation

## 2018-11-01 DIAGNOSIS — Z0000 Encounter for general adult medical examination without abnormal findings: Secondary | ICD-10-CM | POA: Diagnosis not present

## 2018-11-01 DIAGNOSIS — I2510 Atherosclerotic heart disease of native coronary artery without angina pectoris: Secondary | ICD-10-CM | POA: Diagnosis not present

## 2018-11-01 LAB — COMPREHENSIVE METABOLIC PANEL
ALT: 25 U/L (ref 0–53)
AST: 21 U/L (ref 0–37)
Albumin: 4.6 g/dL (ref 3.5–5.2)
Alkaline Phosphatase: 66 U/L (ref 39–117)
BUN: 15 mg/dL (ref 6–23)
CO2: 32 mEq/L (ref 19–32)
Calcium: 9.6 mg/dL (ref 8.4–10.5)
Chloride: 101 mEq/L (ref 96–112)
Creatinine, Ser: 0.97 mg/dL (ref 0.40–1.50)
GFR: 81.94 mL/min (ref >60.00)
Glucose, Bld: 108 mg/dL — ABNORMAL HIGH (ref 70–99)
POTASSIUM: 4.9 meq/L (ref 3.5–5.1)
Sodium: 138 mEq/L (ref 135–145)
Total Bilirubin: 0.5 mg/dL (ref 0.2–1.2)
Total Protein: 7 g/dL (ref 6.0–8.3)

## 2018-11-01 LAB — CBC WITH DIFFERENTIAL/PLATELET
Basophils Absolute: 0.1 10*3/uL (ref 0.0–0.1)
Basophils Relative: 1 % (ref 0.0–3.0)
Eosinophils Absolute: 0.4 10*3/uL (ref 0.0–0.7)
Eosinophils Relative: 4.9 % (ref 0.0–5.0)
HCT: 45.1 % (ref 39.0–52.0)
HEMOGLOBIN: 15 g/dL (ref 13.0–17.0)
LYMPHS PCT: 22.6 % (ref 12.0–46.0)
Lymphs Abs: 1.8 10*3/uL (ref 0.7–4.0)
MCHC: 33.2 g/dL (ref 30.0–36.0)
MCV: 89.5 fl (ref 78.0–100.0)
Monocytes Absolute: 0.8 10*3/uL (ref 0.1–1.0)
Monocytes Relative: 9.4 % (ref 3.0–12.0)
Neutro Abs: 5 10*3/uL (ref 1.4–7.7)
Neutrophils Relative %: 62.1 % (ref 43.0–77.0)
Platelets: 277 10*3/uL (ref 150.0–400.0)
RBC: 5.04 Mil/uL (ref 4.22–5.81)
RDW: 13.8 % (ref 11.5–15.5)
WBC: 8 10*3/uL (ref 4.0–10.5)

## 2018-11-01 LAB — URINALYSIS, ROUTINE W REFLEX MICROSCOPIC
Bilirubin Urine: NEGATIVE
Hgb urine dipstick: NEGATIVE
Ketones, ur: NEGATIVE
Leukocytes,Ua: NEGATIVE
Nitrite: NEGATIVE
PH: 5.5 (ref 5.0–8.0)
Specific Gravity, Urine: 1.025 (ref 1.000–1.030)
Total Protein, Urine: NEGATIVE
Urine Glucose: NEGATIVE
Urobilinogen, UA: 0.2 (ref 0.0–1.0)

## 2018-11-01 LAB — TSH: TSH: 1.42 u[IU]/mL (ref 0.35–4.50)

## 2018-11-01 LAB — PSA: PSA: 0.77 ng/mL (ref 0.10–4.00)

## 2018-11-01 LAB — VITAMIN D 25 HYDROXY (VIT D DEFICIENCY, FRACTURES): VITD: 24.85 ng/mL — AB (ref 30.00–100.00)

## 2018-11-01 LAB — HEMOGLOBIN A1C: Hgb A1c MFr Bld: 5.6 % (ref 4.6–6.5)

## 2018-11-01 MED ORDER — ATORVASTATIN CALCIUM 80 MG PO TABS: ORAL_TABLET | ORAL | 3 refills | Status: DC

## 2018-11-01 MED ORDER — CHOLECALCIFEROL 50 MCG (2000 UT) PO TABS: 1.0000 | ORAL_TABLET | Freq: Every day | ORAL | 1 refills | Status: DC

## 2018-11-01 MED ORDER — CANDESARTAN CILEXETIL 16 MG PO TABS: 16.0000 mg | ORAL_TABLET | Freq: Every day | ORAL | 1 refills | Status: DC

## 2018-11-01 NOTE — Telephone Encounter (Signed)
Reviewed results of recent lab and echocardiogram with patient.  He will take 2 tablets daily of Atorvastatin as he just had the 40 mg tablets filled.  Rx for 80 mg tablets sent into pharmacy with note to pharmacy - pt will call when ready to fill.   Appt scheduled with Dr Anne Fu to review echo in detail.  Aware we will schedule f/u lab at that appt for April.

## 2018-11-01 NOTE — Patient Instructions (Signed)

## 2018-11-01 NOTE — Progress Notes (Signed)
Subjective:  Patient ID: Gabriel Anderson, male    DOB: 1970-12-26  Age: 48 y.o. MRN: [id number]  CC: Annual Exam and Hypertension  NEW TO ME  HPI EMMITT MATTHEWS presents for a CPX.  He has a history of CAD.  Over the last few months he has developed DOE.  He recently saw his cardiologist and an echocardiogram showed that he has developed aortic stenosis.  He denies chest pain, syncope, near syncope, lightheadedness, or dizziness.  History Kenna has a past medical history of Acute myocardial infarction, subendocardial infarction, initial episode of care [facility]) (2013), Arthritis, Coronary atherosclerosis of native coronary artery (05/21/2012), Hyperlipidemia, Raynaud's disease, and Vasospasm (HCC).   He has a past surgical history that includes Rotator cuff repair (Right, 2009); Elbow surgery (Left, 1984); Cardiac catheterization (05/2012);  cardiacstent x 2 (2013); Total hip arthroplasty (Left, 12/30/2013); left heart catheterization with coronary angiogram (N/A, 05/21/2012); percutaneous coronary stent intervention (pci-s) (05/21/2012); and left heart catheterization with coronary angiogram (N/A, 06/02/2012).   His family history includes COPD in his mother; Diabetes in his father; Heart disease in his father; Heart failure in his father; Hypertension in his father; Stroke in his sister.He reports that he has never smoked. He has never used smokeless tobacco. He reports current alcohol use of about 34.0 standard drinks of alcohol per week. He reports current drug use. Drug: Marijuana.  Outpatient Medications Prior to Visit  Medication Sig Dispense Refill  . aspirin 81 MG tablet Take 1 tablet (81 mg total) by mouth daily.    [name] atorvastatin (LIPITOR) 40 MG tablet TAKE 1 TABLET BY MOUTH EVERY DAY (Patient taking differently: TAKE 1 TABLET BY MOUTH EVERY DAY) 90 tablet 1  . nitroGLYCERIN (NITROSTAT) 0.4 MG SL tablet Place 1 tablet (0.4 mg total) under the tongue every 5 (five) minutes as needed.  (Patient not taking: Reported on 11/01/2018) 25 tablet 6  . amoxicillin (AMOXIL) 500 MG capsule Take 500 mg by mouth as needed. Patient reports taking 4 tablets one hour prior dental appt.     No facility-administered medications prior to visit.     ROS Review of Systems  Constitutional: Positive for unexpected weight change (wt gain). Negative for diaphoresis and fatigue.  HENT: Negative.  Negative for trouble swallowing.   Respiratory: Positive for shortness of breath. Negative for cough (DOE), chest tightness, wheezing and stridor.   Cardiovascular: Negative for chest pain and leg swelling.  Gastrointestinal: Negative for abdominal pain, blood in stool, constipation, diarrhea, nausea and vomiting.  Endocrine: Negative.   Genitourinary: Negative.  Negative for difficulty urinating, penile swelling, scrotal swelling, testicular pain and urgency.  Musculoskeletal: Positive for arthralgias. Negative for myalgias.  Skin: Negative.  Negative for color change.  Neurological: Negative.  Negative for dizziness, weakness, light-headedness, numbness and headaches.  Hematological: Negative for adenopathy. Does not bruise/bleed easily.  Psychiatric/Behavioral: Negative.     Objective:  BP (!) 144/88 (BP Location: Left Arm, Patient Position: Sitting, Cuff Size: Large)   Pulse 75   Temp 98.1 F (36.7 C) (Oral)   Resp 16   Ht  (1.778 m)   Wt 249 lb 8 oz (113.2 kg)   SpO2 99%   BMI 35.80 kg/m   Physical Exam Constitutional:      Appearance: He is obese.  HENT:     Nose: Nose normal.     Mouth/Throat:     Mouth: Mucous membranes are moist.     Pharynx: Oropharynx is clear. No oropharyngeal exudate or posterior  oropharyngeal erythema.  Eyes:     General: No scleral icterus.    Conjunctiva/sclera: Conjunctivae normal.  Neck:     Musculoskeletal: Normal range of motion and neck supple. No muscular tenderness.  Cardiovascular:     Rate and Rhythm: Normal rate and regular rhythm.      Pulses:          Carotid pulses are 1+ on the right side and 1+ on the left side.      Radial pulses are 1+ on the right side and 1+ on the left side.       Femoral pulses are 1+ on the right side and 1+ on the left side.      Popliteal pulses are 1+ on the right side and 1+ on the left side.       Dorsalis pedis pulses are 1+ on the right side and 1+ on the left side.       Posterior tibial pulses are 1+ on the right side and 1+ on the left side.     Heart sounds: Murmur present. Systolic murmur present with a grade of 1/6. No diastolic murmur. No gallop.   Pulmonary:     Effort: Pulmonary effort is normal. No respiratory distress.     Breath sounds: Normal breath sounds. No stridor. No wheezing, rhonchi or rales.  Abdominal:     General: Bowel sounds are normal.     Palpations: There is no hepatomegaly or splenomegaly.     Tenderness: There is no abdominal tenderness.     Hernia: A hernia is present. Hernia is present in the ventral area. There is no hernia in the right inguinal area or left inguinal area.     Comments: He has a midline ventral hernia that is only noticed with Valsalva maneuver  Genitourinary:    Pubic Area: No rash.      Penis: Normal and circumcised. No discharge, swelling or lesions.      Scrotum/Testes:        Right: Mass or tenderness not present.        Left: Mass or tenderness not present.     Epididymis:     Right: Normal. Not inflamed or enlarged.     Left: Not inflamed or enlarged.     Prostate: Enlarged (1+ BPH). Not tender and no nodules present.     Rectum: Normal. Guaiac result negative. No tenderness, anal fissure, external hemorrhoid or internal hemorrhoid. Normal anal tone.  Musculoskeletal:     Right lower leg: No edema.     Left lower leg: No edema.  Lymphadenopathy:     Cervical: No cervical adenopathy.     Lower Body: No right inguinal adenopathy. No left inguinal adenopathy.     Lab Results  Component Value Date   WBC 8.0 11/01/2018    HGB 15.0 11/01/2018   HCT 45.1 11/01/2018   PLT 277.0 11/01/2018   GLUCOSE 108 (H) 11/01/2018   CHOL 151 10/26/2018   TRIG 85 10/26/2018   HDL 44 10/26/2018   LDLDIRECT 150.0 09/19/2013   LDLCALC 90 10/26/2018   ALT 25 11/01/2018   AST 21 11/01/2018   NA 138 11/01/2018   K 4.9 11/01/2018   CL 101 11/01/2018   CREATININE 0.97 11/01/2018   BUN 15 11/01/2018   CO2 32 11/01/2018   TSH 1.42 11/01/2018   PSA 0.77 11/01/2018   INR 0.94 12/23/2013   HGBA1C 5.6 11/01/2018    Assessment & Plan:   Amaurion was seen  today for annual exam and hypertension.  Diagnoses and all orders for this visit:  Essential hypertension- His blood pressure is not adequately well controlled.  I will treat the vitamin D deficiency.  Otherwise, his labs are negative for secondary causes or endorgan damage.  I have asked him to start treating this with an ARB. -     CBC with Differential/Platelet; Future -     Comprehensive metabolic panel; Future -     TSH; Future -     Urinalysis, Routine w reflex microscopic; Future -     VITAMIN D 25 Hydroxy (Vit-D Deficiency, Fractures); Future -     candesartan (ATACAND) 16 MG tablet; Take 1 tablet (16 mg total) by mouth daily.  Hyperglycemia- His blood sugars are very mildly elevated consistent with pre-prediabetes.  Medical therapy is not indicated.  He was urged to improve his lifestyle modifications with diet/exercise/weight loss and to consume less alcohol. -     Comprehensive metabolic panel; Future -     Hemoglobin A1c; Future  Routine general medical examination at a health care facility- Exam completed, labs reviewed, vaccines reviewed and updated, Cologuard ordered to screen for colon cancer/polyps, patient education material was given. -     PSA; Future -     HIV Antibody (routine testing w rflx); Future  Coronary artery disease due to lipid rich plaque -     candesartan (ATACAND) 16 MG tablet; Take 1 tablet (16 mg total) by mouth daily.  Need for  influenza vaccination -     Flu Vaccine QUAD 36+ mos IM  Need for Tdap vaccination -     Tdap vaccine greater than or equal to 7yo IM  Colon cancer screening -     Cologuard  Vitamin D deficiency -     Cholecalciferol 50 MCG (2000 UT) TABS; Take 1 tablet (2,000 Units total) by mouth daily.  Aortic stenosis, mild- He has a follow-up appointment with cardiology regarding this.   I have discontinued Tinnie Gens T. Mitrano's amoxicillin. I am also having him start on candesartan and Cholecalciferol. Additionally, I am having him maintain his aspirin EC and nitroGLYCERIN.  Meds ordered this encounter  Medications  . candesartan (ATACAND) 16 MG tablet    Sig: Take 1 tablet (16 mg total) by mouth daily.    Dispense:  90 tablet    Refill:  1  . Cholecalciferol 50 MCG (2000 UT) TABS    Sig: Take 1 tablet (2,000 Units total) by mouth daily.    Dispense:  90 tablet    Refill:  1     Follow-up: Return in about 3 months (around 01/30/2019).  Sanda Linger, MD

## 2018-11-01 NOTE — Telephone Encounter (Signed)
Left message for pt to c/b to discuss echo and lab resullts.

## 2018-11-01 NOTE — Telephone Encounter (Signed)
New Message  ° ° ° °Patient returning call about echo results  °

## 2018-11-02 LAB — HIV ANTIBODY (ROUTINE TESTING W REFLEX): HIV 1&2 Ab, 4th Generation: NONREACTIVE

## 2018-11-10 ENCOUNTER — Encounter: Payer: Self-pay | Admitting: Cardiology

## 2018-11-23 ENCOUNTER — Telehealth: Payer: Self-pay | Admitting: Cardiology

## 2018-11-23 NOTE — Telephone Encounter (Signed)
Patient returned Dr. Anne Fu called. He has a couple of questions he would like to ask him.

## 2018-11-23 NOTE — Telephone Encounter (Signed)
Left voicemail on patient's cell phone about rescheduling appointment secondary to coronavirus.  Gave him office number to call back.  On echocardiogram, he does have moderate/borderline severe aortic stenosis.  We will need to keep a watch on this.  I would like to try to get him in at least within the next 6 months.  Donato Schultz, MD

## 2018-11-23 NOTE — Telephone Encounter (Signed)
Called pt and made him aware that his appointment has been canceled and he will be called in the near future to be rescheduled.   Pt would like a call from Dr. Anne Fu at his earliest convenience to answer some of his questions.

## 2018-11-25 ENCOUNTER — Ambulatory Visit: Payer: 59 | Admitting: Cardiology

## 2018-11-29 NOTE — Telephone Encounter (Signed)
Let's try to set him up for a virtual visit when we are able.  Donato Schultz, MD

## 2018-12-01 ENCOUNTER — Telehealth: Payer: Self-pay | Admitting: Cardiology

## 2018-12-01 NOTE — Telephone Encounter (Signed)
Called and spoke with patient about scheduling a virtual follow up appt.  Pt is agreeable to this and aware I will send a message through MyChart with further instructions for this.  We discussed his echo and aortic stenosis in great detail.  All of his questions were answered.  He does not want to schedule an appt at this time.  Advised I will let him know when Dr Anne Fu would like to see him back.   He will c/b with any further questions or concerns.

## 2018-12-01 NOTE — Telephone Encounter (Signed)
New message   Patient has questions about Aortic stenosis that he would like for Dr. Anne Fu to answer. Please call to discuss.

## 2018-12-01 NOTE — Telephone Encounter (Signed)
See previous documentation

## 2018-12-21 ENCOUNTER — Other Ambulatory Visit: Payer: Self-pay

## 2018-12-21 ENCOUNTER — Encounter: Payer: Self-pay | Admitting: Cardiology

## 2018-12-21 ENCOUNTER — Telehealth (INDEPENDENT_AMBULATORY_CARE_PROVIDER_SITE_OTHER): Payer: 59 | Admitting: Cardiology

## 2018-12-21 VITALS — BP 147/84 | HR 87 | Ht 70.0 in | Wt 246.0 lb

## 2018-12-21 DIAGNOSIS — E7800 Pure hypercholesterolemia, unspecified: Secondary | ICD-10-CM

## 2018-12-21 DIAGNOSIS — I350 Nonrheumatic aortic (valve) stenosis: Secondary | ICD-10-CM

## 2018-12-21 DIAGNOSIS — I2583 Coronary atherosclerosis due to lipid rich plaque: Secondary | ICD-10-CM

## 2018-12-21 DIAGNOSIS — I2510 Atherosclerotic heart disease of native coronary artery without angina pectoris: Secondary | ICD-10-CM

## 2018-12-21 NOTE — Progress Notes (Signed)
Virtual Visit via Telephone Note   This visit type was conducted due to national recommendations for restrictions regarding the COVID-19 Pandemic (e.g. social distancing) in an effort to limit this patient's exposure and mitigate transmission in our community.  Due to his co-morbid illnesses, this patient is at least at moderate risk for complications without adequate follow up.  This format is felt to be most appropriate for this patient at this time.  The patient did not have access to video technology/had technical difficulties with video requiring transitioning to audio format only (telephone).  All issues noted in this document were discussed and addressed.  No physical exam could be performed with this format.  Please refer to the patient's chart for his  consent to telehealth for [facility].   Evaluation Performed:  Follow-up visit  Date:  12/21/2018   ID:  Gabriel Anderson, DOB [birth year], MRN [id number]  Patient Location: Home  Provider Location: Home  PCP:  [name], MD  Cardiologist:  Donato Schultz, MD  Electrophysiologist:  None   Chief Complaint:    History of Present Illness:    Gabriel Anderson is a 48 y.o. male who presents via audio/video conferencing for a telehealth visit today.    He has moderate to severe aortic stenosis on echocardiogram.  He also has coronary artery disease status post DES to proximal LAD x2 on 05/21/2012.  Subsequent catheterization after elevated troponin III showed patent stents but a large diagonal branch that was jailed.  Chronic diffuse disease and septal branches may have caused elevation in troponin and discomfort.  Was placed on isosorbide at that time.  At one time he tried Ranexa which seemed to help him out but he was switched back to Imdur only.  Cost.  I saw him last on 10/26/2018 where he described a few palpitations lasting few seconds but no high risk symptoms such as syncope, no angina.  Enjoys drinking beer which he  thinks is the likely culprit for his weight gain.  When talking about symptoms of aortic stenosis, he does admit that he has been having some shortness of breath with activity such as going up hills.  We will keep a close eye on monitoring the symptoms.  No chest pain, no syncope. The patient does not have symptoms concerning for COVID-19 infection (fever, chills, cough, or new shortness of breath).    Past Medical History:  Diagnosis Date   Acute myocardial infarction, subendocardial infarction, initial episode of care [facility]) 2013   Cardiac catheterization 06/02/12-widely patent previously placed LAD stents. Troponin peaked 3. Normal EF.   Arthritis    Coronary atherosclerosis of native coronary artery 05/21/2012   Cardiac catheterization 05/21/12-95% mid LAD lesion at the bifurcation of a large diagonal branch. Normal ejection fraction.   Hyperlipidemia    Raynaud's disease    Vasospasm (HCC)    takes imdur for   Past Surgical History:  Procedure Laterality Date    cardiacstent x 2  2013   CARDIAC CATHETERIZATION  05/2012   PCI LAD at takeoff of large diagonal   ELBOW SURGERY Left 1984   LEFT HEART CATHETERIZATION WITH CORONARY ANGIOGRAM N/A 05/21/2012   Procedure: LEFT HEART CATHETERIZATION WITH CORONARY ANGIOGRAM;  Surgeon: Corky Crafts, MD;  Location: [facility] CATH LAB;  Service: Cardiovascular;  Laterality: N/A;   LEFT HEART CATHETERIZATION WITH CORONARY ANGIOGRAM N/A 06/02/2012   Procedure: LEFT HEART CATHETERIZATION WITH CORONARY ANGIOGRAM;  Surgeon: Donato Schultz, MD;  Location: [facility] CATH LAB;  Service: Cardiovascular;  Laterality: N/A;   PERCUTANEOUS CORONARY STENT INTERVENTION (PCI-S)  05/21/2012   Procedure: PERCUTANEOUS CORONARY STENT INTERVENTION (PCI-S);  Surgeon: Corky Crafts, MD;  Location: [facility] CATH LAB;  Service: Cardiovascular;;   ROTATOR CUFF REPAIR Right 2009   TOTAL HIP ARTHROPLASTY Left 12/30/2013   Procedure: LEFT TOTAL HIP ARTHROPLASTY ANTERIOR  APPROACH;  Surgeon: Kathryne Hitch, MD;  Location: [facility] ORS;  Service: Orthopedics;  Laterality: Left;     Current Meds  Medication Sig   aspirin 81 MG tablet Take 1 tablet (81 mg total) by mouth daily.   atorvastatin (LIPITOR) 80 MG tablet TAKE 1 TABLET BY MOUTH EVERY DAY   candesartan (ATACAND) 16 MG tablet Take 1 tablet (16 mg total) by mouth daily.   Cholecalciferol 50 MCG (2000 UT) TABS Take 1 tablet (2,000 Units total) by mouth daily.   nitroGLYCERIN (NITROSTAT) 0.4 MG SL tablet Place 1 tablet (0.4 mg total) under the tongue every 5 (five) minutes as needed.     Allergies:   Patient has no known allergies.   Social History   Tobacco Use   Smoking status: Never Smoker   Smokeless tobacco: Never Used  Substance Use Topics   Alcohol use: Yes    Alcohol/week: 34.0 standard drinks    Types: 28 Cans of beer, 2 Shots of liquor, 4 Standard drinks or equivalent per week   Drug use: Yes    Types: Marijuana    Comment: marijuana 0.05 grams smoked  per day for joint pain     Family Hx: The patient's family history includes COPD in his mother; Diabetes in his father; Heart disease in his father; Heart failure in his father; Hypertension in his father; Stroke in his sister.  ROS:   Please see the history of present illness.    Positive for some shortness of breath with activity, no chest pain, no syncope, no bleeding, no orthopnea, no fevers All other systems reviewed and are negative.   Prior CV studies:   The following studies were reviewed today:  Echocardiogram 10/27/2018  1. The left ventricle has a visually estimated ejection fraction of of 55%. The cavity size was normal. There is mildly increased left ventricular wall thickness. Left ventricular diastolic Doppler parameters are consistent with pseudonormalization No  evidence of left ventricular regional wall motion abnormalities.  2. The tricuspid valve is normal in structure.  3. The aortic valve is  tricuspid Severe calcifcation of the aortic valve. Aortic valve regurgitation is trivial by color flow Doppler. moderate-severe stenosis of the aortic valve. Aortic valve mean gradient 39 mmHg with AVA 1.0 cm^2.  Peak velocity 3.5 m/s  4. The aortic root is normal in size and structure.  5. There is mild dilatation of the ascending aorta measuring 39 mm  6. The mitral valve is normal in structure. No evidence of mitral valve stenosis. No regurgitation.  7. Normal IVC. No complete TR doppler jet so unable to estimate PA systolic pressure.  FINDINGS  Left Ventricle: The left ventricle has a visually estimated ejection fraction of of 55%. The cavity size was normal. There is mildly increased left ventricular wall thickness. Left ventricular diastolic Doppler parameters are consistent with  pseudonormalization No evidence of left ventricular regional wall motion abnormalities.. Definity contrast agent was given IV to delineate the left ventricular endocardial borders. Right Ventricle: The right ventricle has mildly reduced systolic function. The cavity was normal. There is no increase in right ventricular wall thickness.     Left  Atrium: left atrial size was normal in size Right Atrium: right atrial size was normal in size. Right atrial pressure is estimated at 3 mmHg.   Interatrial Septum: No atrial level shunt detected by color flow Doppler. Pericardium: There is no evidence of pericardial effusion. Mitral Valve: The mitral valve is normal in structure. Mitral valve regurgitation is not visualized by color flow Doppler. No evidence of mitral valve stenosis. Tricuspid Valve: The tricuspid valve is normal in structure. Tricuspid valve regurgitation was not visualized by color flow Doppler. Aortic Valve: The aortic valve is tricuspid Severe calcifcation of the aortic valve Aortic valve regurgitation is trivial by color flow Doppler. There is moderate-severe stenosis of the aortic valve, with a  calculated valve area of 1.03 cm. Pulmonic Valve: The pulmonic valve was grossly normal. Pulmonic valve regurgitation is not visualized by color flow Doppler. Aorta: The aortic root is normal in size and structure. There is mild dilatation of the ascending aorta measuring 39 mm. Venous: The inferior vena cava is normal in size with greater than 50% respiratory variability.     Labs/Other Tests and Data Reviewed:    EKG:  An ECG dated 10/26/2018 was personally reviewed today and demonstrated:  Sinus bradycardia with no other abnormalities, no change from prior  Recent Labs: 11/01/2018: ALT 25; BUN 15; Creatinine, Ser 0.97; Hemoglobin 15.0; Platelets 277.0; Potassium 4.9; Sodium 138; TSH 1.42   Recent Lipid Panel Lab Results  Component Value Date/Time   CHOL 151 10/26/2018 09:59 AM   TRIG 85 10/26/2018 09:59 AM   HDL 44 10/26/2018 09:59 AM   CHOLHDL 3.4 10/26/2018 09:59 AM   CHOLHDL 3 02/12/2015 09:09 AM   LDLCALC 90 10/26/2018 09:59 AM   LDLDIRECT 150.0 09/19/2013 10:50 AM   LDL was 75 on 02/12/2015 and LDL was 138 on 10/20/2017.  Wt Readings from Last 3 Encounters:  12/21/18 246 lb (111.6 kg)  11/01/18 249 lb 8 oz (113.2 kg)  10/26/18 248 lb 12.8 oz (112.9 kg)     Objective:    Vital Signs:  BP (!) 147/84    Pulse 87    Ht  (1.778 m)    Wt 246 lb (111.6 kg)    BMI 35.30 kg/m    Well nourished, well developed male in no acute distress.  Previous physical exam demonstrated a 2/6 systolic murmur with radiation to the carotids.  ASSESSMENT & PLAN:    Moderate to severe aortic stenosis - Aortic valve stenosis is fairly progressed for his age.  He has not been describing any shortness of breath anginal symptoms or syncope.  His peak velocity is 3.5 m/s, mean gradient is 39.  It is likely that he will require aortic valve replacement soon.  It may be reasonable to proceed with TAVR at a later date.  Given his known coronary artery disease back from 2013 with 2 LAD stents, it  would also be reasonable to proceed with right and left heart catheterization prior to any aortic valve replacement. - At this time given his relative low symptoms, I will repeat an echocardiogram in 6 months to make sure that rapid progression does not take place.  Coronary artery disease -2 LAD stents were placed in 2013, DES.  Had repeat cardiac catheterization which showed a large jailed diagonal branch and diffuse septal disease.  No new stents were placed.  This was in the setting of non-ST elevation myocardial infarction with troponin of 3 in 2013.  Morbid obesity -BMI greater than 35 with  2 or more comorbidities, hypertension and CAD.  Alcohol decrease.  Also has a hip replacement.  Motivated to lose weight.  Hyperlipidemia -Recently increased atorvastatin.  Encouraged use, benefits from secondary prevention standpoint.  COVID-19 Education: The signs and symptoms of COVID-19 were discussed with the patient and how to seek care for testing (follow up with PCP or arrange E-visit).  The importance of social distancing was discussed today.  Time:   Today, I have spent 25 minutes with the patient with telehealth technology discussing the above problems.     Medication Adjustments/Labs and Tests Ordered: Current medicines are reviewed at length with the patient today.  Concerns regarding medicines are outlined above.   Tests Ordered: Orders Placed This Encounter  Procedures   ECHOCARDIOGRAM COMPLETE    Medication Changes: No orders of the defined types were placed in this encounter.   Disposition:  Follow up in 6 month(s)  Signed, Donato SchultzMark Tuyet Bader, MD  12/21/2018 3:35 PM    [facility]

## 2018-12-21 NOTE — Patient Instructions (Signed)
Medication Instructions:  The current medical regimen is effective;  continue present plan and medications.  If you need a refill on your cardiac medications before your next appointment, please call your pharmacy.   Testing/Procedures: Your physician has requested that you have an echocardiogram in 6 months. Echocardiography is a painless test that uses sound waves to create images of your heart. It provides your doctor with information about the size and shape of your heart and how well your heart's chambers and valves are working. This procedure takes approximately one hour. There are no restrictions for this procedure.  This testing can be completed the same day as your follow up appointment with Dr Anne Fu.  Follow-Up: Follow up in 6 months with Dr. Anne Fu.  You will receive a letter in the mail 2 months before you are due.  Please call us when you receive this letter to schedule your follow up appointment.   Thank you for choosing [facility]!!

## 2019-03-17 NOTE — Telephone Encounter (Signed)
Agree.  Please have him set up for an echocardiogram in August that will be 6 months.  Aortic stenosis. Thanks  Candee Furbish, MD

## 2019-03-29 ENCOUNTER — Ambulatory Visit: Payer: Self-pay

## 2019-03-29 NOTE — Telephone Encounter (Signed)
  returnd call to patient after his wife called stating that he had received a burn on the job. 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 It is red and there are welts no blisters. He states that the injury happened about 11/2 hours ago. He states that when he realized what was happening he stopped and washed the area. He has no other burns He states he was wearing a respirator. He rates the pain at 4 and has taken benadryl.   Care advice given to patient call placed to office. Per Dr Ronnald Ramp patient will go to ER for evaluation of the burn area. Pt verbalized understanding of instructions. Reason for Disposition . Blisters present  Answer Assessment - Initial Assessment Questions 1. ONSET: "When did it happen?" If happened < 10 minutes ago, ask: "Did you wash off the chemical?" If not, give First Aid Advice immediately.      1.5 hours ago 2. CHEMICAL: "What is the name of the substance?"     creosote 3. LOCATION: "Where is the burn located?"     Rt arm 4. BURN SIZE: "How large is the burn?"  The palm is roughly 1% of the total body surface area (BSA).    From knuckles to half way to elbo 5. SEVERITY OF THE BURN: "Is there redness?", "Are there any blisters?"     Red with welts 6. MECHANISM: "Tell me how it happened."     Cutting rail road ties 7. PAIN: "Are you having any pain?" "How bad is the pain?" (Scale 1-10; or mild, moderate, severe)   - MILD (1-3): doesn't interfere with normal activities    - MODERATE (4-7): interferes with normal activities or awakens from sleep    - SEVERE (8-10): excruciating pain, unable to do any normal activities     4 8. OTHER SYMPTOMS: "Do you have any other symptoms?"     no 9. PREGNANCY: "Is there any chance you are pregnant?" "When was your last menstrual period?"    N/A  Protocols used: [place]

## 2019-04-16 ENCOUNTER — Other Ambulatory Visit: Payer: Self-pay | Admitting: Internal Medicine

## 2019-04-16 DIAGNOSIS — I10 Essential (primary) hypertension: Secondary | ICD-10-CM

## 2019-04-16 DIAGNOSIS — I2583 Coronary atherosclerosis due to lipid rich plaque: Secondary | ICD-10-CM

## 2019-04-16 DIAGNOSIS — I2510 Atherosclerotic heart disease of native coronary artery without angina pectoris: Secondary | ICD-10-CM

## 2019-04-17 ENCOUNTER — Other Ambulatory Visit: Payer: Self-pay | Admitting: Internal Medicine

## 2019-04-17 DIAGNOSIS — I10 Essential (primary) hypertension: Secondary | ICD-10-CM

## 2019-04-17 DIAGNOSIS — I2510 Atherosclerotic heart disease of native coronary artery without angina pectoris: Secondary | ICD-10-CM

## 2019-05-05 ENCOUNTER — Ambulatory Visit (HOSPITAL_COMMUNITY): Payer: 59 | Attending: Cardiology

## 2019-05-05 ENCOUNTER — Other Ambulatory Visit: Payer: Self-pay

## 2019-05-05 DIAGNOSIS — I350 Nonrheumatic aortic (valve) stenosis: Secondary | ICD-10-CM

## 2019-05-05 MED ORDER — PERFLUTREN LIPID MICROSPHERE
1.0000 mL | INTRAVENOUS | Status: AC | PRN
  Administered 2019-05-05: 2 mL via INTRAVENOUS

## 2019-05-06 ENCOUNTER — Other Ambulatory Visit: Payer: Self-pay | Admitting: *Deleted

## 2019-05-06 DIAGNOSIS — I350 Nonrheumatic aortic (valve) stenosis: Secondary | ICD-10-CM

## 2019-09-16 ENCOUNTER — Encounter: Payer: Self-pay | Admitting: Cardiology

## 2019-09-16 ENCOUNTER — Ambulatory Visit (INDEPENDENT_AMBULATORY_CARE_PROVIDER_SITE_OTHER): Payer: No Typology Code available for payment source | Admitting: Cardiology

## 2019-09-16 ENCOUNTER — Other Ambulatory Visit: Payer: Self-pay

## 2019-09-16 VITALS — BP 112/66 | HR 64 | Ht 70.0 in | Wt 244.6 lb

## 2019-09-16 DIAGNOSIS — I10 Essential (primary) hypertension: Secondary | ICD-10-CM | POA: Diagnosis not present

## 2019-09-16 DIAGNOSIS — I350 Nonrheumatic aortic (valve) stenosis: Secondary | ICD-10-CM

## 2019-09-16 DIAGNOSIS — I2583 Coronary atherosclerosis due to lipid rich plaque: Secondary | ICD-10-CM

## 2019-09-16 DIAGNOSIS — I2510 Atherosclerotic heart disease of native coronary artery without angina pectoris: Secondary | ICD-10-CM | POA: Diagnosis not present

## 2019-09-16 NOTE — Progress Notes (Addendum)
Cardiology Office Note:    Date:  09/16/2019   ID:  KARON HECKENDORN, DOB Apr 10, 1971, MRN [id number]  PCP:  [name], MD  Cardiologist:  Donato Schultz, MD  Electrophysiologist:  None   Referring MD: [name], MD     History of Present Illness:    Gabriel Anderson is a 49 y.o. male with severe aortic stenosis prior CAD here in follow-up.  Cicero Duck wife.   Echocardiogram 05/05/2019:  1. The left ventricle has normal systolic function with an ejection fraction of 60-65%. The cavity size was normal. severe basal septal hypertrophy. Left ventricular diastolic Doppler parameters are consistent with impaired relaxation. Elevated left  ventricular end-diastolic pressure No evidence of left ventricular regional wall motion abnormalities.  2. The right ventricle has normal systolic function. The cavity was normal. There is no increase in right ventricular wall thickness.  3. The aortic valve is tricuspid. Severely thickening of the aortic valve. Severe calcifcation of the aortic valve. Aortic valve regurgitation is trivial by color flow Doppler. Moderate to severe stenosis of the aortic valve. AV Vmax: 338.00 cm/s. AV  Mean Grad: 28.0 mmHg. AV Area (VTI): 1.12 cm. LVOT/AV VTI ratio: 0.34.  4. The aorta is normal unless otherwise noted.  5. Compared to prior echo, The mean AVG has decreased from 39 to and the dimensionless index has improved (0.25>0.34).  Stable aortic stenosis.  CAD post proximal LAD stent x2 in 2013.  Subsequent catheterization after elevated troponin showed patent stents but large diagonal branch that was jailed.  Isosorbide at that time, at one time tried Ranexa but cost was an issue.  Overall doing well currently.  Overall not having any anginal symptoms.  Shortness of breath is improved.  His weight is improved he states after decreasing his alcohol use significantly.  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  Excellent.  He told me a story about his friend in [place] who owns a distillery, took his tesla for a drive.  Past Medical History:  Diagnosis Date  . Acute myocardial infarction, subendocardial infarction, initial episode of care [facility]) 2013   Cardiac catheterization 06/02/12-widely patent previously placed LAD stents. Troponin peaked 3. Normal EF.  [name] Arthritis   . Coronary atherosclerosis of native coronary artery 05/21/2012   Cardiac catheterization 05/21/12-95% mid LAD lesion at the bifurcation of a large diagonal branch. Normal ejection fraction.  . Hyperlipidemia   . Raynaud's disease   . Vasospasm (HCC)    takes imdur for    Past Surgical History:  Procedure Laterality Date  .  cardiacstent x 2  2013  . CARDIAC CATHETERIZATION  05/2012   PCI LAD at takeoff of large diagonal  . ELBOW SURGERY Left 1984  . LEFT HEART CATHETERIZATION WITH CORONARY ANGIOGRAM N/A 05/21/2012   Procedure: LEFT HEART CATHETERIZATION WITH CORONARY ANGIOGRAM;  Surgeon: Corky Crafts, MD;  Location: [facility] C[facility] LAB;  Service: Cardiovascular;  Laterality: N/A;  . LEFT HEART CATHETERIZATION WITH CORONARY ANGIOGRAM N/A 06/02/2012   Procedure: LEFT HEART CATHETERIZATION WITH CORONARY ANGIOGRAM;  Surgeon: Donato Schultz, MD;  Location: [facility] CATH LAB;  Service: Cardiovascular;  Laterality: N/A;  . PERCUTANEOUS CORONARY STENT INTERVENTION (PCI-S)  05/21/2012   Procedure: PERCUTANEOUS CORONARY STENT INTERVENTION (PCI-S);  Surgeon: Corky Crafts, MD;  Location: [facility] CATH LAB;  Service: Cardiovascular;;  . ROTATOR CUFF REPAIR Right 2009  . TOTAL HIP ARTHROPLASTY  Left 12/30/2013   Procedure: LEFT TOTAL HIP ARTHROPLASTY ANTERIOR APPROACH;  Surgeon: Mcarthur Rossetti, MD;  Location: [facility] ORS;  Service: Orthopedics;  Laterality: Left;    Current Medications: Current Meds  Medication Sig  . aspirin 81 MG tablet Take 1 tablet (81 mg total) by mouth daily.  [name] atorvastatin (LIPITOR)  80 MG tablet TAKE 1 TABLET BY MOUTH EVERY DAY  . candesartan (ATACAND) 16 MG tablet TAKE 1 TABLET BY MOUTH EVERY DAY  . Cholecalciferol 50 MCG (2000 UT) TABS Take 1 tablet (2,000 Units total) by mouth daily.  . nitroGLYCERIN (NITROSTAT) 0.4 MG SL tablet Place 1 tablet (0.4 mg total) under the tongue every 5 (five) minutes as needed.     Allergies:   Patient has no known allergies.   Social History   Socioeconomic History  . Marital status: Married    Spouse name: Not on file  . Number of children: Not on file  . Years of education: Not on file  . Highest education level: Not on file  Occupational History  . Not on file  Tobacco Use  . Smoking status: Never Smoker  . Smokeless tobacco: Never Used  Substance and Sexual Activity  . Alcohol use: Yes    Alcohol/week: 34.0 standard drinks    Types: 28 Cans of beer, 2 Shots of liquor, 4 Standard drinks or equivalent per week  . Drug use: Yes    Types: Marijuana    Comment: marijuana 0.05 grams smoked  per day for joint pain  . Sexual activity: Yes    Partners: Female  Other Topics Concern  . Not on file  Social History Narrative  . Not on file   Social Determinants of Health   Financial Resource Strain:   . Difficulty of Paying Living Expenses: Not on file  Food Insecurity:   . Worried About Charity fundraiser in the Last Year: Not on file  . Ran Out of Food in the Last Year: Not on file  Transportation Needs:   . Lack of Transportation (Medical): Not on file  . Lack of Transportation (Non-Medical): Not on file  Physical Activity:   . Days of Exercise per Week: Not on file  . Minutes of Exercise per Session: Not on file  Stress:   . Feeling of Stress : Not on file  Social Connections:   . Frequency of Communication with Friends and Family: Not on file  . Frequency of Social Gatherings with Friends and Family: Not on file  . Attends Religious Services: Not on file  . Active Member of Clubs or Organizations: Not on file    . Attends Archivist Meetings: Not on file  . Marital Status: Not on file     Family History: The patient's family history includes COPD in his mother; Diabetes in his father; Heart disease in his father; Heart failure in his father; Hypertension in his father; Stroke in his sister.  ROS:   Please see the history of present illness.   No myalgias no fevers no chills  All other systems reviewed and are negative.  EKGs/Labs/Other Studies Reviewed:    The following studies were reviewed today: Echo is as above  EKG: No new  Recent Labs: 11/01/2018: ALT 25; BUN 15; Creatinine, Ser 0.97; Hemoglobin 15.0; Platelets 277.0; Potassium 4.9; Sodium 138; TSH 1.42  Recent Lipid Panel    Component Value Date/Time   CHOL 151 10/26/2018 0959   TRIG 85 10/26/2018 0959   HDL 44  10/26/2018 0959   CHOLHDL 3.4 10/26/2018 0959   CHOLHDL 3 02/12/2015 0909   VLDL 16.0 02/12/2015 0909   LDLCALC 90 10/26/2018 0959   LDLDIRECT 150.0 09/19/2013 1050    Physical Exam:    VS:  BP 112/66   Pulse 64   Ht 5\' 10"  (1.778 m)   Wt 244 lb 9.6 oz (110.9 kg)   SpO2 96%   BMI 35.10 kg/m     Wt Readings from Last 3 Encounters:  09/16/19 244 lb 9.6 oz (110.9 kg)  12/21/18 246 lb (111.6 kg)  11/01/18 249 lb 8 oz (113.2 kg)     GEN:  Well nourished, well developed in no acute distress HEENT: Normal NECK: No JVD; No carotid bruits LYMPHATICS: No lymphadenopathy CARDIAC: RRR, 3/6 systolic murmur, no rubs, gallops RESPIRATORY:  Clear to auscultation without rales, wheezing or rhonchi  ABDOMEN: Soft, non-tender, non-distended MUSCULOSKELETAL:  No edema; No deformity  SKIN: Warm and dry NEUROLOGIC:  Alert and oriented x 3 PSYCHIATRIC:  Normal affect   ASSESSMENT:    1. Nonrheumatic aortic valve stenosis   2. Coronary artery disease due to lipid rich plaque   3. Morbid obesity (HCC)   4. Essential hypertension    PLAN:    In order of problems listed above:  Moderate to severe aortic  stenosis -February and August 2020 echocardiograms stable.  We will continue to monitor with another echocardiogram in August 2021.  We will have him come back to the office after that point.  He is not having any significant shortness of breath chest pain or syncope.  Alcohol use -Has cut back significantly on his drinking.  Excellent.  Morbid obesity -BMI 35.1 with 2 or more comorbidities.  Continue to work on weight loss.  Coronary artery disease with hyperlipidemia -Continue with aspirin and atorvastatin 80.  High intensity.  Last LDL was 90 in February 2020.  Ultimately goal is less than 70.  Could consider addition of Zetia if necessary or potentially changing him to Crestor.  Potential hip surgery -Given that his aortic stenosis is not in the severe range and he is asymptomatic, he could proceed with hip surgery if felt absolutely necessary at this time with low to moderate overall risk.      Medication Adjustments/Labs and Tests Ordered: Current medicines are reviewed at length with the patient today.  Concerns regarding medicines are outlined above.  Orders Placed This Encounter  Procedures  . ECHOCARDIOGRAM COMPLETE   No orders of the defined types were placed in this encounter.   Patient Instructions  Medication Instructions:  The current medical regimen is effective;  continue present plan and medications.  *If you need a refill on your cardiac medications before your next appointment, please call your pharmacy*  Testing/Procedures: Your physician has requested that you have an echocardiogram.  This is due in August before seeing Dr September.  Echocardiography is a painless test that uses sound waves to create images of your heart. It provides your doctor with information about the size and shape of your heart and how well your heart's chambers and valves are working. This procedure takes approximately one hour. There are no restrictions for this  procedure.  Follow-Up: At [facility], you and your health needs are our priority.  As part of our continuing mission to provide you with exceptional heart care, we have created designated Provider Care Teams.  These Care Teams include your primary Cardiologist (physician) and Advanced Practice Providers (APPs -  Physician Assistants  and Nurse Practitioners) who all work together to provide you with the care you need, when you need it.  Your next appointment:   7 month(s) (August 2021)  The format for your next appointment:   In Person  Provider:   Donato Schultz, MD  Thank you for choosing [facility]!!        Signed, Donato Schultz, MD  09/16/2019 10:40 AM    [facility]

## 2019-09-16 NOTE — Patient Instructions (Signed)
Medication Instructions:  The current medical regimen is effective;  continue present plan and medications.  *If you need a refill on your cardiac medications before your next appointment, please call your pharmacy*  Testing/Procedures: Your physician has requested that you have an echocardiogram.  This is due in August before seeing Dr Anne Fu.  Echocardiography is a painless test that uses sound waves to create images of your heart. It provides your doctor with information about the size and shape of your heart and how well your heart's chambers and valves are working. This procedure takes approximately one hour. There are no restrictions for this procedure.  Follow-Up: At [facility], you and your health needs are our priority.  As part of our continuing mission to provide you with exceptional heart care, we have created designated Provider Care Teams.  These Care Teams include your primary Cardiologist (physician) and Advanced Practice Providers (APPs -  Physician Assistants and Nurse Practitioners) who all work together to provide you with the care you need, when you need it.  Your next appointment:   7 month(s) (August 2021)  The format for your next appointment:   In Person  Provider:   Donato Schultz, MD  Thank you for choosing [facility]!!

## 2019-11-17 ENCOUNTER — Ambulatory Visit: Payer: Self-pay

## 2019-11-17 ENCOUNTER — Other Ambulatory Visit: Payer: Self-pay

## 2019-11-17 ENCOUNTER — Ambulatory Visit (INDEPENDENT_AMBULATORY_CARE_PROVIDER_SITE_OTHER): Payer: 59 | Admitting: Orthopaedic Surgery

## 2019-11-17 DIAGNOSIS — M25551 Pain in right hip: Secondary | ICD-10-CM

## 2019-11-17 DIAGNOSIS — M25511 Pain in right shoulder: Secondary | ICD-10-CM | POA: Diagnosis not present

## 2019-11-17 DIAGNOSIS — M1611 Unilateral primary osteoarthritis, right hip: Secondary | ICD-10-CM | POA: Diagnosis not present

## 2019-11-17 DIAGNOSIS — Z96611 Presence of right artificial shoulder joint: Secondary | ICD-10-CM

## 2019-11-17 MED ORDER — LIDOCAINE HCL 1 % IJ SOLN
3.0000 mL | INTRAMUSCULAR | Status: AC | PRN
  Administered 2019-11-17: 16:00:00 3 mL

## 2019-11-17 MED ORDER — METHYLPREDNISOLONE ACETATE 40 MG/ML IJ SUSP
40.0000 mg | INTRAMUSCULAR | Status: AC | PRN
  Administered 2019-11-17: 16:00:00 40 mg via INTRA_ARTICULAR

## 2019-11-17 MED ORDER — HYDROCODONE-ACETAMINOPHEN 5-325 MG PO TABS: 1.0000 | ORAL_TABLET | Freq: Four times a day (QID) | ORAL | 0 refills | Status: DC | PRN

## 2019-11-17 NOTE — Progress Notes (Signed)
Office Visit Note   Patient: Gabriel Anderson           Date of Birth: [birth year]           MRN: [id number] Visit Date: 11/17/2019             - Requested by: Janith Lima, MD [address] [place],  [place] [address] PCP: Janith Lima, MD   Assessment & Plan: Visit Diagnoses:  1. Pain in right hip   2. Acute pain of right shoulder     Plan: Given the x-ray findings and the clinical exam findings showing severe weakness in the right shoulder I am concerned of a significant rotator cuff tear.  A MRI is certainly warranted to rule out the rotator cuff tear.  I did place a steroid injection in the subacromial outlet to hopefully decrease his pain and I will send in some hydrocodone.  We will address the hip at a later time but he does likely need a hip replacement at some point given the arthritis in his hip.  All questions and concerns were answered and addressed.  We will see him back in 2 weeks to hopefully go over the MRI of his right shoulder.  He will try to use it as comfort allows.  Follow-Up Instructions: Return in about 2 weeks (around 12/01/2019).   Orders:  Orders Placed This Encounter  Procedures  . Large Joint Inj  . XR HIP UNILAT W OR W/O PELVIS 1V RIGHT  . XR Shoulder Right   Meds ordered this encounter  Medications  . HYDROcodone-acetaminophen (NORCO/VICODIN) 5-325 MG tablet    Sig: Take 1 tablet by mouth every 6 (six) hours as needed for moderate pain.    Dispense:  40 tablet    Refill:  0      Procedures: Large Joint Inj: R subacromial bursa on 11/17/2019 3:31 PM Indications: pain and diagnostic evaluation Details: 22 G 1.5 in needle  Arthrogram: No  Medications: 3 mL lidocaine 1 %; 40 mg methylPREDNISolone acetate 40 MG/ML Outcome: tolerated well, no immediate complications Procedure, treatment alternatives, risks and benefits explained, specific risks discussed. Consent was given by the patient. Immediately prior to procedure a time out was  called to verify the correct patient, procedure, equipment, support staff and site/side marked as required. Patient was prepped and draped in the usual sterile fashion.       Clinical Data: No additional findings.   Subjective: Chief Complaint  Patient presents with  . Right Shoulder - Pain  . Right Hip - Pain  The patient is a new patient although this is only because I have not seen him in about 5 years.  Almost 6 years ago he underwent a successful left total hip arthroplasty and that hip is done well.  He did have some arthritis in the right hip but does not really bother him.  More recently has been getting slightly worse but then on Monday he was working with a teller and he tripped and fell landing hard on his right shoulder as well as his right hip.  He has had significant shoulder weakness since then.  We actually performed a rotator cuff repair of his right shoulder about 11 or 12 years ago.  He has had no issues with the shoulder and some mild pain in the right hip but it has gotten significantly worse since that fall on Monday.  He does see a cardiologist regularly due to some heart issues.  He is not on blood thinning medication.  He denies any numbness and tingling in his hands or feet.  He denies any significant neck pain.  He does have a significant abrasion to the right side of his neck and scalp.  HPI  Review of Systems He currently denies any headache, chest pain, shortness of breath, fever, chills, nausea, vomiting  Objective: Vital Signs: There were no vitals taken for this visit.  Physical Exam He is alert and orient x3 and in no acute distress Ortho Exam Examination of his right shoulder shows profound and severe weakness of the rotator cuff.  He cannot even hold his shoulder at 90 degrees abducted at all.  His external rotation is also limited.  Clinically the shoulder is well located.  Examination of his right hip shows significant stiffness and pain with internal  and external rotation.  The left hip moves smoothly. Specialty Comments:  No specialty comments available.  Imaging: XR HIP UNILAT W OR W/O PELVIS 1V RIGHT  Result Date: 11/17/2019 A low AP pelvis and lateral of the right hip shows moderate to severe osteoarthritis of the right hip.  Compared to previous films from over 5 years ago there is significant joint space narrowing as well as peritracheal osteophytes.  There is a well-seated total hip arthroplasty on the left side which is some slight heterotopic ossification.  XR Shoulder Right  Result Date: 11/17/2019 3 views of the right shoulder show the shoulder is well located.  There is evidence of previous shoulder surgery.  There is decrease in the subacromial outlet and the humeral head is slightly high riding.    PMFS History: Patient Active Problem List   Diagnosis Date Noted  . Hyperglycemia 11/01/2018  . Routine general medical examination at a health care facility 11/01/2018  . Need for influenza vaccination 11/01/2018  . Need for Tdap vaccination 11/01/2018  . Colon cancer screening 11/01/2018  . Vitamin D deficiency 11/01/2018  . Morbid obesity (HCC) 10/26/2018  . Coronary artery disease due to lipid rich plaque 10/26/2018  . Aortic stenosis, mild 10/26/2018  . Arthritis of left hip 12/30/2013  . Hypertension   . Hyperlipidemia   . Dyslipidemia, goal LDL below 70 06/02/2012  . Coronary atherosclerosis of native coronary artery 05/21/2012   Past Medical History:  Diagnosis Date  . Acute myocardial infarction, subendocardial infarction, initial episode of care [facility]) 2013   Cardiac catheterization 06/02/12-widely patent previously placed LAD stents. Troponin peaked 3. Normal EF.  [name] Arthritis   . Coronary atherosclerosis of native coronary artery 05/21/2012   Cardiac catheterization 05/21/12-95% mid LAD lesion at the bifurcation of a large diagonal branch. Normal ejection fraction.  . Hyperlipidemia   . Raynaud's disease     . Vasospasm (HCC)    takes imdur for    Family History  Problem Relation Age of Onset  . Hypertension Father   . Diabetes Father   . Heart failure Father   . Heart disease Father   . Stroke Sister   . COPD Mother     Past Surgical History:  Procedure Laterality Date  .  cardiacstent x 2  2013  . CARDIAC CATHETERIZATION  05/2012   PCI LAD at takeoff of large diagonal  . ELBOW SURGERY Left 1984  . LEFT HEART CATHETERIZATION WITH CORONARY ANGIOGRAM N/A 05/21/2012   Procedure: LEFT HEART CATHETERIZATION WITH CORONARY ANGIOGRAM;  Surgeon: Corky Crafts, MD;  Location: [facility] CATH LAB;  Service: Cardiovascular;  Laterality: N/A;  . LEFT HEART  CATHETERIZATION WITH CORONARY ANGIOGRAM N/A 06/02/2012   Procedure: LEFT HEART CATHETERIZATION WITH CORONARY ANGIOGRAM;  Surgeon: Donato Schultz, MD;  Location[facility] CATH LAB;  Service: Cardiovascular;  Laterality: N/A;  . PERCUTANEOUS CORONARY STENT INTERVENTION (PCI-S)  05/21/2012   Procedure: PERCUTANEOUS CORONARY STENT INTERVENTION (PCI-S);  Surgeon: Corky Crafts, MD;  Location: [facility] CATH LAB;  Service: Cardiovascular;;  . ROTATOR CUFF REPAIR Right 2009  . TOTAL HIP ARTHROPLASTY Left 12/30/2013   Procedure: LEFT TOTAL HIP ARTHROPLASTY ANTERIOR APPROACH;  Surgeon: Kathryne Hitch, MD;  Location: [facility] ORS;  Service: Orthopedics;  Laterality: Left;   Social History   Occupational History  . Not on file  Tobacco Use  . Smoking status: Never Smoker  . Smokeless tobacco: Never Used  Substance and Sexual Activity  . Alcohol use: Yes    Alcohol/week: 34.0 standard drinks    Types: 28 Cans of beer, 2 Shots of liquor, 4 Standard drinks or equivalent per week  . Drug use: Yes    Types: Marijuana    Comment: marijuana 0.05 grams smoked  per day for joint pain  . Sexual activity: Yes    Partners: Female

## 2019-12-01 ENCOUNTER — Ambulatory Visit: Payer: 59 | Admitting: Physician Assistant

## 2019-12-02 ENCOUNTER — Ambulatory Visit
Admission: RE | Admit: 2019-12-02 | Discharge: 2019-12-02 | Disposition: A | Discharge status: Home / Self Care | Payer: 59 | Source: Ambulatory Visit | Attending: Orthopaedic Surgery | Admitting: Orthopaedic Surgery

## 2019-12-02 DIAGNOSIS — Z96611 Presence of right artificial shoulder joint: Secondary | ICD-10-CM

## 2019-12-09 ENCOUNTER — Other Ambulatory Visit: Payer: Self-pay | Admitting: Cardiology

## 2019-12-15 ENCOUNTER — Other Ambulatory Visit: Payer: 59

## 2019-12-19 ENCOUNTER — Ambulatory Visit (INDEPENDENT_AMBULATORY_CARE_PROVIDER_SITE_OTHER): Payer: 59 | Admitting: Physician Assistant

## 2019-12-19 ENCOUNTER — Other Ambulatory Visit: Payer: Self-pay

## 2019-12-19 ENCOUNTER — Encounter: Payer: Self-pay | Admitting: Physician Assistant

## 2019-12-19 DIAGNOSIS — S46011A Strain of muscle(s) and tendon(s) of the rotator cuff of right shoulder, initial encounter: Secondary | ICD-10-CM | POA: Insufficient documentation

## 2019-12-19 DIAGNOSIS — S46011D Strain of muscle(s) and tendon(s) of the rotator cuff of right shoulder, subsequent encounter: Secondary | ICD-10-CM

## 2019-12-19 NOTE — Progress Notes (Signed)
HPI: [name] returns today to go over the MRI of his right shoulder.  He states that the intra-articular injection was somewhat helpful.  He feels that his range of motion has increased since the injection and still having some pain but it is improved.  He did undergo a right rotator cuff repair some 11 to 12 years ago.  He states he has had some on and off pain in the shoulder but has been overall able to do the activities that he wanted to do.  Given his pain started to become significantly worse after a recent fall.  MRI images are reviewed with the patient today.  MRI of the right shoulder dated 12/02/2019 showed complete full-thickness tear of the supraspinatus and infraspinatus tendons with retraction to the level of the glenohumeral joint.  Biceps tendon is medially dislocated from the bicipital groove.  Mild to moderate glenohumeral and acromioclavicular joint arthritic changes.  Humeral head slightly superiorly subluxed.  Mild atrophy of the rotator cuff.  Impression: Right shoulder complete rotator cuff tear status post fall  Plan: Patient does have some cardiac issues with a history of MI and stent placement.  He will need cardiac clearance before scheduling for any shoulder surgery.  Would recommend right shoulder arthroscopy with possible rotator cuff repair.  Did discuss with him due to the extent of the tear and the fact that he has had a previous rotator cuff repair and is now failed may be unrepairable.  Questions were encouraged and answered at length by [name] myself.  We will proceed with right shoulder arthroscopy with possible rotator cuff repair sometime in the near future.  He will follow up 1 week postop.

## 2019-12-28 ENCOUNTER — Telehealth: Payer: Self-pay | Admitting: *Deleted

## 2019-12-28 NOTE — Telephone Encounter (Signed)
   [facility] Pre-operative Risk Assessment    Request for surgical clearance:  1. What type of surgery is being performed? RIGHT SHOULDER ARTHROSCOPY   2. When is this surgery scheduled? TBD   3. What type of clearance is required (medical clearance vs. Pharmacy clearance to hold med vs. Both)? MEDICAL  4. Are there any medications that need to be held prior to surgery and how long? ASA    5. Practice name and name of physician performing surgery? [facility]; DR. Jean Rosenthal   6. What is your office phone number [phone]    7.   What is your office fax number [phone] ATTN: SHERRIE  8.   Anesthesia type (None, local, MAC, general) ? GENERAL + BLOCK   Julaine Hua 12/28/2019, 11:05 AM  _________________________________________________________________   (provider comments below)

## 2019-12-29 NOTE — Telephone Encounter (Signed)
He may hold ASA for 5 days prior. OK to proceed with shoulder surgery. Donato Schultz, MD

## 2019-12-29 NOTE — Telephone Encounter (Signed)
   Primary Cardiologist: Donato Schultz, MD  Chart reviewed as part of pre-operative protocol coverage. Given past medical history and time since last visit, based on ACC/AHA guidelines, CLARICE BONAVENTURE would be at acceptable risk for the planned procedure without further cardiovascular testing.   He may hold his aspirin for 5 days prior to his surgery and restart as soon as hemostasis is achieved.   I will route this recommendation to the requesting party via Epic fax function and remove from pre-op pool.  Please call with questions.  [name]. Quanna Wittke NP-C        [facility] [address] Office [phone] Fax [phone]

## 2020-01-05 ENCOUNTER — Telehealth: Payer: Self-pay

## 2020-01-05 NOTE — Telephone Encounter (Signed)
Patient is not feeling quite right.  Even though cleared for surgery by cardiologist, he wants to be checked to make sure heart okay.  He will get cardiology appointment moved up.  In the meantime, while we were getting clearance and playing phone tag, patient saw Dr. Everardo Pacific for 2nd opinion.  He said Dr. Everardo Pacific said, as you said, that this surgery might not help and a replacement might need to be done.  He thinks he might want to go that route.  He stated Dr. Everardo Pacific does that surgery and I mentioned Dr. August Saucer.  He will let us know if he changes his mind otherwise once comfortably cleared by cardiology he will follow up with Dr. Everardo Pacific or Dr. August Saucer for shoulder replacement.  Please let him know if you have any further input or thoughts on the situation.

## 2020-01-23 ENCOUNTER — Other Ambulatory Visit: Payer: Self-pay | Admitting: Internal Medicine

## 2020-01-23 DIAGNOSIS — I2510 Atherosclerotic heart disease of native coronary artery without angina pectoris: Secondary | ICD-10-CM

## 2020-01-23 DIAGNOSIS — I10 Essential (primary) hypertension: Secondary | ICD-10-CM

## 2020-02-15 ENCOUNTER — Other Ambulatory Visit: Payer: Self-pay

## 2020-02-15 ENCOUNTER — Ambulatory Visit (HOSPITAL_COMMUNITY): Payer: No Typology Code available for payment source | Attending: Cardiology

## 2020-02-15 DIAGNOSIS — I350 Nonrheumatic aortic (valve) stenosis: Secondary | ICD-10-CM | POA: Insufficient documentation

## 2020-02-16 ENCOUNTER — Other Ambulatory Visit: Payer: Self-pay | Admitting: *Deleted

## 2020-02-16 DIAGNOSIS — I350 Nonrheumatic aortic (valve) stenosis: Secondary | ICD-10-CM

## 2020-02-16 NOTE — Progress Notes (Signed)
Moderate aortic stenosis.  Normal pump function Dilated aorta mild 44 mm.  Repeat ECHO in one year ...  Order placed for repeat echo in 1 year.  Pt reviewed results via MyChart

## 2020-02-17 ENCOUNTER — Encounter: Payer: Self-pay | Admitting: Cardiology

## 2020-02-17 ENCOUNTER — Encounter: Payer: Self-pay | Admitting: *Deleted

## 2020-02-17 ENCOUNTER — Other Ambulatory Visit: Payer: Self-pay

## 2020-02-17 ENCOUNTER — Ambulatory Visit (INDEPENDENT_AMBULATORY_CARE_PROVIDER_SITE_OTHER): Payer: No Typology Code available for payment source | Admitting: Cardiology

## 2020-02-17 VITALS — BP 146/80 | HR 78 | Ht 70.0 in | Wt 245.6 lb

## 2020-02-17 DIAGNOSIS — I350 Nonrheumatic aortic (valve) stenosis: Secondary | ICD-10-CM

## 2020-02-17 DIAGNOSIS — I2510 Atherosclerotic heart disease of native coronary artery without angina pectoris: Secondary | ICD-10-CM | POA: Diagnosis not present

## 2020-02-17 DIAGNOSIS — E7800 Pure hypercholesterolemia, unspecified: Secondary | ICD-10-CM | POA: Diagnosis not present

## 2020-02-17 DIAGNOSIS — Z79899 Other long term (current) drug therapy: Secondary | ICD-10-CM

## 2020-02-17 DIAGNOSIS — I2583 Coronary atherosclerosis due to lipid rich plaque: Secondary | ICD-10-CM

## 2020-02-17 DIAGNOSIS — I10 Essential (primary) hypertension: Secondary | ICD-10-CM | POA: Diagnosis not present

## 2020-02-17 MED ORDER — ROSUVASTATIN CALCIUM 40 MG PO TABS: 40.0000 mg | ORAL_TABLET | Freq: Every day | ORAL | 3 refills | Status: DC

## 2020-02-17 NOTE — Progress Notes (Signed)
Cardiology Office Note:    Date:  02/17/2020   ID:  Gabriel Anderson, DOB 1971/03/22, MRN [id number]  PCP:  [name], MD  [facility] Cardiologist:  Donato Schultz, MD  [facility] Electrophysiologist:  None   Referring MD: [name], MD     History of Present Illness:    Gabriel Anderson is a 49 y.o. male here for the follow-up of aortic stenosis and coronary artery disease.  Wife name is [name]. [place] 29 year old son, [employer] [facility] - [employer].  Miles 14 - shed pounds stopping soda.   ECHO 02/15/20:  1. Left ventricular ejection fraction, by estimation, is 60 to 65%. The  left ventricle has normal function. The left ventricle has no regional  wall motion abnormalities. Left ventricular diastolic parameters are  consistent with Grade I diastolic  dysfunction (impaired relaxation).  2. Right ventricular systolic function is normal. The right ventricular  size is normal.  3. The mitral valve is normal in structure. No evidence of mitral valve  regurgitation. No evidence of mitral stenosis.  4. The aortic valve has an indeterminant number of cusps. Aortic valve  regurgitation is mild to moderate. Moderate aortic valve stenosis. Aortic  regurgitation PHT measures 501 msec. Aortic valve area, by VTI measures  1.23 cm. Aortic valve mean gradient  measures 34.0 mmHg. Aortic valve Vmax measures 3.76 m/s.  5. Aortic dilatation noted. There is mild dilatation of the ascending  aorta measuring 44 mm.  6. The inferior vena cava is normal in size with greater than 50%  respiratory variability, suggesting right atrial pressure of 3 mmHg.   Comparison(s): Two echocardiograms ago, mean aortic valve gradient was  . Similar with current echo.   Stable aortic stenosis.  CAD post proximal LAD stent x2 in 2013.  Subsequent catheterization after elevated troponin showed patent stents but large diagonal branch that was jailed.  Isosorbide at that time, at one  time tried Ranexa but cost was an issue.  Overall doing well currently.  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  Stated prior visit he significantly decreased his alcohol intake.  Overall been feeling well without any chest pain or shortness of breath. Trying to lose weight. Shoulder injury see below.  Past Medical History:  Diagnosis Date   Acute myocardial infarction, subendocardial infarction, initial episode of care [facility]) 2013   Cardiac catheterization 06/02/12-widely patent previously placed LAD stents. Troponin peaked 3. Normal EF.   Arthritis    Coronary atherosclerosis of native coronary artery 05/21/2012   Cardiac catheterization 05/21/12-95% mid LAD lesion at the bifurcation of a large diagonal branch. Normal ejection fraction.   Hyperlipidemia    Raynaud's disease    Vasospasm (HCC)    takes imdur for    Past Surgical History:  Procedure Laterality Date    cardiacstent x 2  2013   CARDIAC CATHETERIZATION  05/2012   PCI LAD at takeoff of large diagonal   ELBOW SURGERY Left 1984   LEFT HEART CATHETERIZATION WITH CORONARY ANGIOGRAM N/A 05/21/2012   Procedure: LEFT HEART CATHETERIZATION WITH CORONARY ANGIOGRAM;  Surgeon: Corky Crafts, MD;  Location: [facility] CATH LAB;  Service: Cardiovascular;  Laterality: N/A;   LEFT HEART CATHETERIZATION WITH CORONARY ANGIOGRAM N/A 06/02/2012   Procedure: LEFT HEART CATHETERIZATION WITH CORONARY ANGIOGRAM;  Surgeon: Donato Schultz, MD;  Location: [facility] CATH LAB;  Service: Cardiovascular;  Laterality: N/A;  PERCUTANEOUS CORONARY STENT INTERVENTION (PCI-S)  05/21/2012   Procedure: PERCUTANEOUS CORONARY STENT INTERVENTION (PCI-S);  Surgeon: Jettie Booze, MD;  Location: [facility] CATH LAB;  Service: Cardiovascular;;   ROTATOR CUFF REPAIR Right 2009   TOTAL HIP ARTHROPLASTY Left 12/30/2013   Procedure: LEFT TOTAL HIP ARTHROPLASTY ANTERIOR APPROACH;  Surgeon:  Mcarthur Rossetti, MD;  Location: [facility] ORS;  Service: Orthopedics;  Laterality: Left;    Current Medications: Current Meds  Medication Sig   aspirin 81 MG tablet Take 1 tablet (81 mg total) by mouth daily.   candesartan (ATACAND) 16 MG tablet TAKE 1 TABLET BY MOUTH EVERY DAY   Cholecalciferol 50 MCG (2000 UT) TABS Take 1 tablet (2,000 Units total) by mouth daily.   nitroGLYCERIN (NITROSTAT) 0.4 MG SL tablet Place 1 tablet (0.4 mg total) under the tongue every 5 (five) minutes as needed.   [DISCONTINUED] atorvastatin (LIPITOR) 80 MG tablet TAKE 1 TABLET BY MOUTH EVERY DAY     Allergies:   Patient has no known allergies.   Social History   Socioeconomic History   Marital status: Married    Spouse name: Not on file   Number of children: Not on file   Years of education: Not on file   Highest education level: Not on file  Occupational History   Not on file  Tobacco Use   Smoking status: Never Smoker   Smokeless tobacco: Never Used  Vaping Use   Vaping Use: Never used  Substance and Sexual Activity   Alcohol use: Yes    Alcohol/week: 34.0 standard drinks    Types: 28 Cans of beer, 2 Shots of liquor, 4 Standard drinks or equivalent per week   Drug use: Yes    Types: Marijuana    Comment: marijuana 0.05 grams smoked  per day for joint pain   Sexual activity: Yes    Partners: Female  Other Topics Concern   Not on file  Social History Narrative   Not on file   Social Determinants of Health   Financial Resource Strain:    Difficulty of Paying Living Expenses:   Food Insecurity:    Worried About Charity fundraiser in the Last Year:    Arboriculturist in the Last Year:   Transportation Needs:    Film/video editor (Medical):    Lack of Transportation (Non-Medical):   Physical Activity:    Days of Exercise per Week:    Minutes of Exercise per Session:   Stress:    Feeling of Stress :   Social Connections:    Frequency of Communication  with Friends and Family:    Frequency of Social Gatherings with Friends and Family:    Attends Religious Services:    Active Member of Clubs or Organizations:    Attends Music therapist:    Marital Status:      Family History: The patient's family history includes COPD in his mother; Diabetes in his father; Heart disease in his father; Heart failure in his father; Hypertension in his father; Stroke in his sister.  ROS:   Please see the history of present illness.     All other systems reviewed and are negative.  EKGs/Labs/Other Studies Reviewed:    The following studies were reviewed today: Echocardiograms reviewed MRI shoulder: 1. Complete full-thickness tears of the supraspinatus and infraspinatus tendons with retraction to the level of the glenohumeral joint. 2. Severe tendinosis with near-complete tear of the subscapularis tendon. 3. Medial dislocation of the  long head of the biceps tendon from the bicipital groove. Tendon is severely tendinotic. 4. Surgical anchors within the greater tuberosity from prior rotator cuff repair with subcortical cystic change, edema, and possible erosion. No appreciable fracture line. 5. Mild-to-moderate glenohumeral and acromioclavicular joint osteoarthritis.  EKG:  EKG is  ordered today.  The ekg ordered today demonstrates sinus rhythm 78 with no other abnormalities.  Recent Labs: No results found for requested labs within last 8760 hours.  Recent Lipid Panel    Component Value Date/Time   CHOL 151 10/26/2018 0959   TRIG 85 10/26/2018 0959   HDL 44 10/26/2018 0959   CHOLHDL 3.4 10/26/2018 0959   CHOLHDL 3 02/12/2015 0909   VLDL 16.0 02/12/2015 0909   LDLCALC 90 10/26/2018 0959   LDLDIRECT 150.0 09/19/2013 1050    Physical Exam:    VS:  BP (!) 146/80    Pulse 78    Ht 5\' 10"  (1.778 m)    Wt 245 lb 9.6 oz (111.4 kg)    SpO2 98%    BMI 35.24 kg/m     Wt Readings from Last 3 Encounters:  02/17/20 245 lb 9.6 oz  (111.4 kg)  09/16/19 244 lb 9.6 oz (110.9 kg)  12/21/18 246 lb (111.6 kg)     GEN:  Well nourished, well developed in no acute distress HEENT: Normal NECK: No JVD; No carotid bruits LYMPHATICS: No lymphadenopathy CARDIAC: RRR, 3/6 SM, no rubs, gallops RESPIRATORY:  Clear to auscultation without rales, wheezing or rhonchi  ABDOMEN: Soft, non-tender, non-distended MUSCULOSKELETAL:  No edema; No deformity  SKIN: Warm and dry NEUROLOGIC:  Alert and oriented x 3 PSYCHIATRIC:  Normal affect   ASSESSMENT:    1. Nonrheumatic aortic valve stenosis   2. Coronary artery disease due to lipid rich plaque   3. Essential hypertension   4. Pure hypercholesterolemia   5. Long-term use of high-risk medication    PLAN:    In order of problems listed above:  Moderate to severe aortic stenosis -Most recent echocardiogram shows mean gradient of 34 mmHg, peak velocity of 3.7 m/s. -No significant increase from prior echocardiogram.  Continue to monitor on a yearly basis at this point.  Not having any significant symptoms of shortness of breath chest pain or syncope.  Coronary artery disease with hyperlipidemia -Prior PCI of LAD in 2013 at large diagonal takeoff.  Stable.  No anginal symptoms.  Hyperlipidemia -LDL goal less than 70.  At last check 90 on 10/26/2018.  ALT was 25 creatinine 0.9 hemoglobin 15. -He has been taking atorvastatin 80 mg once a day.  We will go ahead and change to Crestor 40 mg a day and recheck in 3 months.  We could add Zetia 10 mg a day if necessary.  He is working on diet and exercise as well.  Shoulder tear --Shoulder replacement recommended. Dr. 10/28/2018, Dax with Everardo Pacific --Preoperative risk stratification-he may proceed with shoulder surgery with moderate overall cardiac risk based upon his prior coronary artery disease, LAD stent, he is not on Plavix, he may hold his aspirin if necessary for 5 days prior to surgery.  He also has moderate aortic  stenosis.    Medication Adjustments/Labs and Tests Ordered: Current medicines are reviewed at length with the patient today.  Concerns regarding medicines are outlined above.  Orders Placed This Encounter  Procedures   ALT   Lipid panel   EKG 12-Lead   ECHOCARDIOGRAM COMPLETE   Meds ordered this encounter  Medications  rosuvastatin (CRESTOR) 40 MG tablet    Sig: Take 1 tablet (40 mg total) by mouth daily.    Dispense:  90 tablet    Refill:  3    Patient Instructions  Medication Instructions:  Please discontinue your Atorvastatin.  Start Crestor 40 mg once a day.  Continue all other medications as listed.  *If you need a refill on your cardiac medications before your next appointment, please call your pharmacy*  Lab Work: Please have blood work in 3 months.  Lipid/ALT If you have labs (blood work) drawn today and your tests are completely normal, you will receive your results only by:  MyChart Message (if you have MyChart) OR  A paper copy in the mail If you have any lab test that is abnormal or we need to change your treatment, we will call you to review the results.  Testing/Procedures: Your physician has requested that you have an echocardiogram in 1 year. Echocardiography is a painless test that uses sound waves to create images of your heart. It provides your doctor with information about the size and shape of your heart and how well your hearts chambers and valves are working. This procedure takes approximately one hour. There are no restrictions for this procedure.  Follow-Up: At [facility], you and your health needs are our priority.  As part of our continuing mission to provide you with exceptional heart care, we have created designated Provider Care Teams.  These Care Teams include your primary Cardiologist (physician) and Advanced Practice Providers (APPs -  Physician Assistants and Nurse Practitioners) who all work together to provide you with the care  you need, when you need it.  We recommend signing up for the patient portal called "MyChart".  Sign up information is provided on this After Visit Summary.  MyChart is used to connect with patients for Virtual Visits (Telemedicine).  Patients are able to view lab/test results, encounter notes, upcoming appointments, etc.  Non-urgent messages can be sent to your provider as well.   To learn more about what you can do with MyChart, go to [email or web address].    Your next appointment:   12 month(s)  The format for your next appointment:   In Person  Provider:   Donato Schultz, MD   Thank you for choosing [facility]!!        Signed, Donato Schultz, MD  02/17/2020 10:12 AM    [facility]

## 2020-02-17 NOTE — Patient Instructions (Signed)
Medication Instructions:  Please discontinue your Atorvastatin.  Start Crestor 40 mg once a day.  Continue all other medications as listed.  *If you need a refill on your cardiac medications before your next appointment, please call your pharmacy*  Lab Work: Please have blood work in 3 months.  Lipid/ALT If you have labs (blood work) drawn today and your tests are completely normal, you will receive your results only by: [name] MyChart Message (if you have MyChart) OR . A paper copy in the mail If you have any lab test that is abnormal or we need to change your treatment, we will call you to review the results.  Testing/Procedures: Your physician has requested that you have an echocardiogram in 1 year. Echocardiography is a painless test that uses sound waves to create images of your heart. It provides your doctor with information about the size and shape of your heart and how well your heart's chambers and valves are working. This procedure takes approximately one hour. There are no restrictions for this procedure.  Follow-Up: At [facility], you and your health needs are our priority.  As part of our continuing mission to provide you with exceptional heart care, we have created designated Provider Care Teams.  These Care Teams include your primary Cardiologist (physician) and Advanced Practice Providers (APPs -  Physician Assistants and Nurse Practitioners) who all work together to provide you with the care you need, when you need it.  We recommend signing up for the patient portal called "MyChart".  Sign up information is provided on this After Visit Summary.  MyChart is used to connect with patients for Virtual Visits (Telemedicine).  Patients are able to view lab/test results, encounter notes, upcoming appointments, etc.  Non-urgent messages can be sent to your provider as well.   To learn more about what you can do with MyChart, go to [email or web address].    Your next appointment:   12  month(s)  The format for your next appointment:   In Person  Provider:   Donato Schultz, MD   Thank you for choosing [facility]!!

## 2020-02-22 ENCOUNTER — Other Ambulatory Visit: Payer: Self-pay | Admitting: Internal Medicine

## 2020-02-22 ENCOUNTER — Telehealth: Payer: Self-pay | Admitting: Internal Medicine

## 2020-02-22 MED ORDER — SCOPOLAMINE 1 MG/3DAYS TD PT72: 1.0000 | MEDICATED_PATCH | TRANSDERMAL | 0 refills | Status: DC

## 2020-02-22 NOTE — Telephone Encounter (Signed)
    Patient requesting prescription for Scopolamine. He states he is going out on a boat tomorrow and would llike something for nausea Patient last seen 2020

## 2020-02-22 NOTE — Telephone Encounter (Signed)
Pt last saw you on 10/28/2018.   Rq rx for scopolamine patches for a boating trip he is going on tomorrow.

## 2020-03-08 ENCOUNTER — Telehealth: Payer: Self-pay | Admitting: *Deleted

## 2020-03-08 NOTE — Telephone Encounter (Signed)
   Primary Cardiologist: Donato Schultz, MD  Chart reviewed as part of pre-operative protocol coverage. Given past medical history and time since last visit, based on ACC/AHA guidelines, Gabriel Anderson would be at acceptable risk for the planned procedure without further cardiovascular testing.   He may hold his aspirin for 5 days prior to his procedure.  Please resume as soon as hemostasis is achieved.  I will route this recommendation to the requesting party via Epic fax function and remove from pre-op pool.  Please call with questions.  [name]. Candace Ramus NP-C    03/08/2020, 12:03 PM [facility] [address] Office [phone] Fax [phone]

## 2020-03-08 NOTE — Telephone Encounter (Signed)
   [facility] Pre-operative Risk Assessment    HEARTCARE STAFF: - Please ensure there is not already an duplicate clearance open for this procedure. - Under Visit Info/Reason for Call, type in Other and utilize the format Clearance MM/DD/YY or Clearance TBD. Do not use dashes or single digits. - If request is for dental extraction, please clarify the # of teeth to be extracted.  Request for surgical clearance:  1. What type of surgery is being performed? RIGHT REVERSE TOTAL SHOULDER REPLACEMENT   2. When is this surgery scheduled? TBD   3. What type of clearance is required (medical clearance vs. Pharmacy clearance to hold med vs. Both)? MEDICAL  4. Are there any medications that need to be held prior to surgery and how long? ASA    5. Practice name and name of physician performing surgery? [facility]; DR. DAX VARKEY   6. What is the office phone number? [phone]   7.   What is the office fax number? [place].   Anesthesia type (None, local, MAC, general) ? CHOICE   Julaine Hua 03/08/2020, 11:47 AM  _________________________________________________________________   (provider comments below)

## 2020-03-20 ENCOUNTER — Telehealth: Payer: Self-pay

## 2020-03-20 NOTE — Telephone Encounter (Addendum)
LVM for patient to call back and schedule a Pre-op surgical clearance visit with Dr Yetta Barre.  Form up front in red folder at my desk.

## 2020-03-27 ENCOUNTER — Other Ambulatory Visit: Payer: Self-pay

## 2020-03-27 ENCOUNTER — Ambulatory Visit (INDEPENDENT_AMBULATORY_CARE_PROVIDER_SITE_OTHER): Payer: No Typology Code available for payment source | Admitting: Internal Medicine

## 2020-03-27 ENCOUNTER — Encounter: Payer: Self-pay | Admitting: Internal Medicine

## 2020-03-27 VITALS — BP 142/70 | HR 66 | Temp 98.6°F | Resp 16 | Ht 70.0 in | Wt 248.0 lb

## 2020-03-27 DIAGNOSIS — D539 Nutritional anemia, unspecified: Secondary | ICD-10-CM

## 2020-03-27 DIAGNOSIS — R739 Hyperglycemia, unspecified: Secondary | ICD-10-CM

## 2020-03-27 DIAGNOSIS — Z1159 Encounter for screening for other viral diseases: Secondary | ICD-10-CM

## 2020-03-27 DIAGNOSIS — E785 Hyperlipidemia, unspecified: Secondary | ICD-10-CM | POA: Diagnosis not present

## 2020-03-27 DIAGNOSIS — I10 Essential (primary) hypertension: Secondary | ICD-10-CM

## 2020-03-27 DIAGNOSIS — Z0000 Encounter for general adult medical examination without abnormal findings: Secondary | ICD-10-CM

## 2020-03-27 DIAGNOSIS — E559 Vitamin D deficiency, unspecified: Secondary | ICD-10-CM

## 2020-03-27 DIAGNOSIS — Z1211 Encounter for screening for malignant neoplasm of colon: Secondary | ICD-10-CM | POA: Diagnosis not present

## 2020-03-27 NOTE — Progress Notes (Signed)
Subjective:  Patient ID: Gabriel Anderson, male    DOB: 1970/09/14  Age: 49 y.o. MRN: [id number]  CC: Annual Exam, Hypertension, Hyperlipidemia, and Coronary Artery Disease  This visit occurred during the SARS-CoV-2 public health emergency.  Safety protocols were in place, including screening questions prior to the visit, additional usage of staff PPE, and extensive cleaning of exam room while observing appropriate contact time as indicated for disinfecting solutions.    HPI Gabriel Anderson presents for a CPX.  He injured his right shoulder a few months ago and is scheduled to have right shoulder replacement surgery.  He recently saw his cardiologist and got approval to have the surgery.  He is active and denies any recent episodes of chest pain, shortness of breath, palpitations, near syncope, dizziness, lightheadedness, edema, or fatigue.  Outpatient Medications Prior to Visit  Medication Sig Dispense Refill  . amoxicillin (AMOXIL) 500 MG tablet Take 500 mg by mouth 3 (three) times daily.    [name] aspirin 81 MG tablet Take 1 tablet (81 mg total) by mouth daily.    . rosuvastatin (CRESTOR) 40 MG tablet Take 1 tablet (40 mg total) by mouth daily. 90 tablet 3  . candesartan (ATACAND) 16 MG tablet TAKE 1 TABLET BY MOUTH EVERY DAY 90 tablet 1  . Cholecalciferol 50 MCG (2000 UT) TABS Take 1 tablet (2,000 Units total) by mouth daily. 90 tablet 1  . nitroGLYCERIN (NITROSTAT) 0.4 MG SL tablet Place 1 tablet (0.4 mg total) under the tongue every 5 (five) minutes as needed. (Patient not taking: Reported on 03/27/2020) 25 tablet 6  . scopolamine (TRANSDERM-SCOP) 1 MG/3DAYS Place 1 patch (1.5 mg total) onto the skin every 3 (three) days. (Patient not taking: Reported on 03/27/2020) 4 patch 0   No facility-administered medications prior to visit.    ROS Review of Systems  Constitutional: Negative for appetite change, diaphoresis, fatigue and unexpected weight change.  HENT: Negative.   Eyes: Negative.    Respiratory: Negative for cough, chest tightness, shortness of breath and wheezing.   Cardiovascular: Negative for chest pain, palpitations and leg swelling.  Gastrointestinal: Negative for abdominal pain, blood in stool, constipation, diarrhea, nausea and vomiting.  Endocrine: Negative.   Genitourinary: Negative.  Negative for difficulty urinating, dysuria, hematuria, scrotal swelling and testicular pain.  Musculoskeletal: Positive for arthralgias. Negative for myalgias.  Skin: Negative.  Negative for color change, pallor and rash.  Neurological: Negative.  Negative for dizziness, weakness, light-headedness, numbness and headaches.  Hematological: Negative for adenopathy. Does not bruise/bleed easily.  Psychiatric/Behavioral: Negative.     Objective:  BP (!) 142/70 (BP Location: Left Arm, Patient Position: Sitting, Cuff Size: Large)   Pulse 66   Temp 98.6 F (37 C) (Oral)   Resp 16   Ht 5\' 10"  (1.778 m)   Wt 248 lb (112.5 kg)   SpO2 96%   BMI 35.58 kg/m   BP Readings from Last 3 Encounters:  03/27/20 (!) 142/70  02/17/20 (!) 146/80  09/16/19 112/66    Wt Readings from Last 3 Encounters:  03/27/20 248 lb (112.5 kg)  02/17/20 245 lb 9.6 oz (111.4 kg)  09/16/19 244 lb 9.6 oz (110.9 kg)    Physical Exam Vitals reviewed. Exam conducted with a chaperone present 11/14/19).  Constitutional:      Appearance: He is obese.  HENT:     Nose: Nose normal.     Mouth/Throat:     Mouth: Mucous membranes are moist.  Eyes:  General: No scleral icterus.    Conjunctiva/sclera: Conjunctivae normal.  Cardiovascular:     Rate and Rhythm: Normal rate and regular rhythm.     Heart sounds: Murmur heard.  Systolic murmur is present with a grade of 2/6.  No gallop.      Comments: 2/6 SEM Pulmonary:     Effort: Pulmonary effort is normal.     Breath sounds: No stridor. No wheezing, rhonchi or rales.  Chest:     Chest wall: No tenderness.  Abdominal:     General: Abdomen is  protuberant. Bowel sounds are normal. There is no distension.     Palpations: Abdomen is soft. There is no hepatomegaly, splenomegaly or mass.     Tenderness: There is no abdominal tenderness.     Hernia: No hernia is present. There is no hernia in the left inguinal area or right inguinal area.  Genitourinary:    Pubic Area: No rash.      Penis: Normal and circumcised. No discharge, swelling or lesions.      Testes: Normal.        Right: Mass, tenderness or swelling not present.        Left: Mass, tenderness or swelling not present.     Epididymis:     Right: Normal. No mass.     Left: Normal. No mass.     Prostate: Enlarged (1+ smooth symm BPH). Not tender and no nodules present.     Rectum: Normal. Guaiac result negative. No mass, tenderness, anal fissure, external hemorrhoid or internal hemorrhoid. Normal anal tone.  Musculoskeletal:        General: Normal range of motion.     Cervical back: Neck supple.     Right lower leg: No edema.     Left lower leg: No edema.  Lymphadenopathy:     Cervical: No cervical adenopathy.     Lower Body: No right inguinal adenopathy. No left inguinal adenopathy.  Skin:    General: Skin is warm and dry.     Coloration: Skin is not pale.  Neurological:     General: No focal deficit present.     Mental Status: He is alert and oriented to person, place, and time. Mental status is at baseline.  Psychiatric:        Mood and Affect: Mood normal.        Behavior: Behavior normal.     Lab Results  Component Value Date   WBC 5.7 03/27/2020   HGB 13.1 (L) 03/27/2020   HCT 39.7 03/27/2020   PLT 252 03/27/2020   GLUCOSE 97 03/27/2020   CHOL 109 03/27/2020   TRIG 76 03/27/2020   HDL 42 03/27/2020   LDLDIRECT 150.0 09/19/2013   LDLCALC 51 03/27/2020   ALT 26 03/27/2020   AST 20 03/27/2020   NA 139 03/27/2020   K 4.3 03/27/2020   CL 102 03/27/2020   CREATININE 0.97 03/27/2020   BUN 17 03/27/2020   CO2 29 03/27/2020   TSH 2.06 03/27/2020   PSA  0.7 03/27/2020   INR 0.94 12/23/2013   HGBA1C 5.5 03/27/2020    MR SHOULDER RIGHT WO CONTRAST  Result Date: 12/02/2019 CLINICAL DATA:  Right shoulder pain after fall 11/14/2019. Prior history of rotator cuff repair 2009. EXAM: MRI OF THE RIGHT SHOULDER WITHOUT CONTRAST TECHNIQUE: Multiplanar, multisequence MR imaging of the shoulder was performed. No intravenous contrast was administered. COMPARISON:  X-ray 11/17/2019 FINDINGS: Technical note: Mildly motion degraded examination. Rotator cuff: Surgical anchors within the greater  tuberosity related to prior rotator cuff repair. Complete full-thickness tears of the supraspinatus and infraspinatus tendons retracted to the level of the glenohumeral joint. Severe tendinosis with near complete tear of the subscapularis tendon. Intact teres minor. Muscles: Edematous appearance of the supraspinatus and infraspinatus tendons which are mildly atrophic. Biceps long head: Severe biceps tendinopathy. Tendon is medially dislocated from the bicipital groove. Acromioclavicular Joint: Posttraumatic deformity of the [facility] joint with mild arthropathy. Small volume subacromial-subdeltoid bursal fluid communicating with the glenohumeral joint. Glenohumeral Joint: Mild-to-moderate chondral thinning and surface irregularity. Humeral head is superiorly subluxed. Labrum: Blunted, irregular appearance of the labrum which may reflect a combination of degeneration and prior postsurgical change. Bones: Surgical anchor sites within the greater tuberosity with subcortical cystic change and possible erosion (series 4, image 4). No appreciable fracture line. Humeral head is high-riding without dislocation. Other: None. IMPRESSION: 1. Complete full-thickness tears of the supraspinatus and infraspinatus tendons with retraction to the level of the glenohumeral joint. 2. Severe tendinosis with near-complete tear of the subscapularis tendon. 3. Medial dislocation of the long head of the biceps tendon  from the bicipital groove. Tendon is severely tendinotic. 4. Surgical anchors within the greater tuberosity from prior rotator cuff repair with subcortical cystic change, edema, and possible erosion. No appreciable fracture line. 5. Mild-to-moderate glenohumeral and acromioclavicular joint osteoarthritis. Electronically Signed   By: Duanne Guess D.O.   On: 12/02/2019 09:55    Assessment & Plan:   Vasilios was seen today for annual exam, hypertension, hyperlipidemia and coronary artery disease.  Diagnoses and all orders for this visit:  Essential hypertension- His blood pressure is not adequately well controlled.  He tells me he is taking candesartan but according to prescription refills he would have run out months ago.  I will restart candesartan but at the higher dose of 32 mg a day.  His labs are negative for secondary causes or endorgan damage.  He agrees to improve his lifestyle modifications. -     CBC with Differential/Platelet; Future -     Basic metabolic panel; Future -     TSH; Future -     Urinalysis, Routine w reflex microscopic; Future -     Urinalysis, Routine w reflex microscopic -     TSH -     Basic metabolic panel -     CBC with Differential/Platelet  Colon cancer screening -     Cologuard  Dyslipidemia, goal LDL below 70- He has achieved his LDL goal is doing well on the statin. -     Hepatic function panel; Future -     TSH; Future -     TSH -     Hepatic function panel  Hyperglycemia- His blood sugar is normal now. -     Basic metabolic panel; Future -     Hemoglobin A1c; Future -     Hemoglobin A1c -     Basic metabolic panel  Vitamin D deficiency- His vitamin D level remains low.  I have asked him to restart the vitamin D supplement. -     VITAMIN D 25 Hydroxy (Vit-D Deficiency, Fractures); Future -     VITAMIN D 25 Hydroxy (Vit-D Deficiency, Fractures) -     Cholecalciferol 50 MCG (2000 UT) TABS; Take 1 tablet (2,000 Units total) by mouth  daily.  Need for hepatitis C screening test -     Hepatitis C antibody; Future -     Hepatitis C antibody  Routine general medical examination at a  health care facility- Exam completed, labs reviewed, vaccines reviewed and updated, cologuard ordered to screen for colon cancer/polyps, patient education material was given. -     Lipid panel; Future -     Hepatitis C antibody; Future -     PSA; Future -     PSA -     Hepatitis C antibody -     Lipid panel  Deficiency anemia- He is asymptomatic with this and I do not see any sources of blood loss.  I have asked him to return to be screened for vitamin deficiencies.         -     Vitamin B12; Future -     Folate; Future -     Reticulocytes; Future -     Vitamin B1; Future -     Iron, TIBC and Ferritin Panel; Future   I have discontinued Tinnie Gens T. Schlafer's candesartan. I am also having him start on candesartan. Additionally, I am having him maintain his aspirin EC, nitroGLYCERIN, rosuvastatin, scopolamine, amoxicillin, and Cholecalciferol.  Meds ordered this encounter  Medications  . Cholecalciferol 50 MCG (2000 UT) TABS    Sig: Take 1 tablet (2,000 Units total) by mouth daily.    Dispense:  90 tablet    Refill:  1  . candesartan (ATACAND) 32 MG tablet    Sig: Take 1 tablet (32 mg total) by mouth daily.    Dispense:  90 tablet    Refill:  1   In addition to time spent on CPE, I spent 50 minutes in preparing to see the patient by review of recent labs, imaging and procedures, obtaining and reviewing separately obtained history, communicating with the patient and family or caregiver, ordering medications, tests or procedures, and documenting clinical information in the EHR including the differential Dx, treatment, and any further evaluation and other management of 1. Essential hypertension 2. Dyslipidemia, goal LDL below 70 3. Hyperglycemia 4. Vitamin D deficiency 5. Deficiency anemia     Follow-up: Return in about 6 months  (around 09/27/2020).  Sanda Linger, MD

## 2020-03-27 NOTE — Patient Instructions (Signed)

## 2020-03-28 DIAGNOSIS — D539 Nutritional anemia, unspecified: Secondary | ICD-10-CM | POA: Insufficient documentation

## 2020-03-28 LAB — URINALYSIS, ROUTINE W REFLEX MICROSCOPIC
Bacteria, UA: NONE SEEN /HPF
Bilirubin Urine: NEGATIVE
Glucose, UA: NEGATIVE
Hgb urine dipstick: NEGATIVE
Hyaline Cast: NONE SEEN /LPF
Ketones, ur: NEGATIVE
Leukocytes,Ua: NEGATIVE
Nitrite: NEGATIVE
Protein, ur: NEGATIVE
RBC / HPF: NONE SEEN /HPF (ref 0–2)
Specific Gravity, Urine: 1.012 (ref 1.001–1.03)
Squamous Epithelial / HPF: NONE SEEN /HPF (ref <=5)
WBC, UA: NONE SEEN /HPF (ref 0–5)
pH: 5.5 (ref 5.0–8.0)

## 2020-03-28 LAB — CBC WITH DIFFERENTIAL/PLATELET
Absolute Monocytes: 678 cells/uL (ref 200–950)
Basophils Absolute: 57 cells/uL (ref 0–200)
Basophils Relative: 1 %
Eosinophils Absolute: 371 cells/uL (ref 15–500)
Eosinophils Relative: 6.5 %
HCT: 39.7 % (ref 38.5–50.0)
Hemoglobin: 13.1 g/dL — ABNORMAL LOW (ref 13.2–17.1)
Lymphs Abs: 1938 cells/uL (ref 850–3900)
MCH: 29.8 pg (ref 27.0–33.0)
MCHC: 33 g/dL (ref 32.0–36.0)
MCV: 90.2 fL (ref 80.0–100.0)
MPV: 9.6 fL (ref 7.5–12.5)
Monocytes Relative: 11.9 %
Neutro Abs: 2656 cells/uL (ref 1500–7800)
Neutrophils Relative %: 46.6 %
Platelets: 252 10*3/uL (ref 140–400)
RBC: 4.4 10*6/uL (ref 4.20–5.80)
RDW: 12.2 % (ref 11.0–15.0)
Total Lymphocyte: 34 %
WBC: 5.7 10*3/uL (ref 3.8–10.8)

## 2020-03-28 LAB — HEMOGLOBIN A1C
Hgb A1c MFr Bld: 5.5 % of total Hgb (ref <5.7)
Mean Plasma Glucose: 111 (calc)
eAG (mmol/L): 6.2 (calc)

## 2020-03-28 LAB — LIPID PANEL
Cholesterol: 109 mg/dL (ref <200)
HDL: 42 mg/dL (ref >=40)
LDL Cholesterol (Calc): 51 mg/dL (calc)
Non-HDL Cholesterol (Calc): 67 mg/dL (calc) (ref <130)
Total CHOL/HDL Ratio: 2.6 (calc) (ref <5.0)
Triglycerides: 76 mg/dL (ref <150)

## 2020-03-28 LAB — VITAMIN D 25 HYDROXY (VIT D DEFICIENCY, FRACTURES): Vit D, 25-Hydroxy: 26 ng/mL — ABNORMAL LOW (ref 30–100)

## 2020-03-28 LAB — BASIC METABOLIC PANEL
BUN: 17 mg/dL (ref 7–25)
CO2: 29 mmol/L (ref 20–32)
Calcium: 9.5 mg/dL (ref 8.6–10.3)
Chloride: 102 mmol/L (ref 98–110)
Creat: 0.97 mg/dL (ref 0.70–1.33)
Glucose, Bld: 97 mg/dL (ref 65–99)
Potassium: 4.3 mmol/L (ref 3.5–5.3)
Sodium: 139 mmol/L (ref 135–146)

## 2020-03-28 LAB — HEPATIC FUNCTION PANEL
AG Ratio: 1.9 (calc) (ref 1.0–2.5)
ALT: 26 U/L (ref 9–46)
AST: 20 U/L (ref 10–35)
Albumin: 4.2 g/dL (ref 3.6–5.1)
Alkaline phosphatase (APISO): 53 U/L (ref 35–144)
Bilirubin, Direct: 0.1 mg/dL (ref 0.0–0.2)
Globulin: 2.2 g/dL (calc) (ref 1.9–3.7)
Indirect Bilirubin: 0.3 mg/dL (calc) (ref 0.2–1.2)
Total Bilirubin: 0.4 mg/dL (ref 0.2–1.2)
Total Protein: 6.4 g/dL (ref 6.1–8.1)

## 2020-03-28 LAB — PSA: PSA: 0.7 ng/mL (ref <=4.0)

## 2020-03-28 LAB — HEPATITIS C ANTIBODY
Hepatitis C Ab: NONREACTIVE
SIGNAL TO CUT-OFF: 0.01 (ref <1.00)

## 2020-03-28 LAB — TSH: TSH: 2.06 mIU/L (ref 0.40–4.50)

## 2020-03-28 MED ORDER — CHOLECALCIFEROL 50 MCG (2000 UT) PO TABS: 1.0000 | ORAL_TABLET | Freq: Every day | ORAL | 1 refills | Status: DC

## 2020-03-28 MED ORDER — CANDESARTAN CILEXETIL 32 MG PO TABS: 32.0000 mg | ORAL_TABLET | Freq: Every day | ORAL | 1 refills | Status: DC

## 2020-04-04 ENCOUNTER — Telehealth: Payer: Self-pay

## 2020-04-06 NOTE — Telephone Encounter (Signed)
COVID vaccine info added.

## 2020-04-12 ENCOUNTER — Other Ambulatory Visit (HOSPITAL_COMMUNITY): Payer: 59

## 2020-04-25 ENCOUNTER — Other Ambulatory Visit: Payer: Self-pay | Admitting: Internal Medicine

## 2020-04-25 DIAGNOSIS — E559 Vitamin D deficiency, unspecified: Secondary | ICD-10-CM

## 2020-04-25 DIAGNOSIS — I10 Essential (primary) hypertension: Secondary | ICD-10-CM

## 2020-04-25 DIAGNOSIS — I2510 Atherosclerotic heart disease of native coronary artery without angina pectoris: Secondary | ICD-10-CM

## 2020-04-25 MED ORDER — CHOLECALCIFEROL 50 MCG (2000 UT) PO TABS: 1.0000 | ORAL_TABLET | Freq: Every day | ORAL | 1 refills | Status: DC

## 2020-05-07 NOTE — Patient Instructions (Addendum)
DUE TO COVID-19 ONLY ONE VISITOR IS ALLOWED TO COME WITH YOU AND STAY IN THE WAITING ROOM ONLY DURING PRE OP AND PROCEDURE DAY OF SURGERY. THE 1 VISITOR  MAY VISIT WITH YOU AFTER SURGERY IN YOUR PRIVATE ROOM DURING VISITING HOURS ONLY!  YOU NEED TO HAVE A COVID 19 TEST ON: 05/19/20 @ 11:00 am , THIS TEST MUST BE DONE BEFORE SURGERY,  COVID TESTING SITE [address] [place] [id number], IT IS ON THE RIGHT GOING OUT [address] APPROXIMATELY  2 MINUTES PAST [employer] ON THE RIGHT. ONCE YOUR COVID TEST IS COMPLETED,  PLEASE BEGIN THE QUARANTINE INSTRUCTIONS AS OUTLINED IN YOUR HANDOUT.                Gabriel Anderson   Your procedure is scheduled on: 05/23/20   Report to [facility] Main  Entrance   Report to short stay at: 5:30 AM     Call this number if you have problems the morning of surgery [phone]    Remember:   NO SOLID FOOD AFTER MIDNIGHT THE NIGHT PRIOR TO SURGERY. NOTHING BY MOUTH EXCEPT CLEAR LIQUIDS UNTIL: 4:30 am . PLEASE FINISH ENSURE DRINK PER SURGEON ORDER  WHICH NEEDS TO BE COMPLETED AT: 4:30 am .  CLEAR LIQUID DIET   Foods Allowed                                                                     Foods Excluded  Coffee and tea, regular and decaf                             liquids that you cannot  Plain Jell-O any favor except red or purple                                           see through such as: Fruit ices (not with fruit pulp)                                     milk, soups, orange juice  Iced Popsicles                                    All solid food Carbonated beverages, regular and diet                                    Cranberry, grape and apple juices Sports drinks like Gatorade Lightly seasoned clear broth or consume(fat free) Sugar, honey syrup  Sample Menu Breakfast                                Lunch  Supper Cranberry juice                    Beef broth                             Chicken broth Jell-O                                     Grape juice                           Apple juice Coffee or tea                        Jell-O                                      Popsicle                                                Coffee or tea                        Coffee or tea  _____________________________________________________________________    BRUSH YOUR TEETH MORNING OF SURGERY AND RINSE YOUR MOUTH OUT, NO CHEWING GUM CANDY OR MINTS.     Take these medicines the morning of surgery with A SIP OF WATER: amoxicillin.                                You may not have any metal on your body including hair pins and              piercings  Do not wear jewelry, lotions, powders or perfumes, deodorant             Men may shave face and neck.   Do not bring valuables to the hospital. [facility] IS NOT             RESPONSIBLE   FOR VALUABLES.  Contacts, dentures or bridgework may not be worn into surgery.  Leave suitcase in the car. After surgery it may be brought to your room.     Patients discharged the day of surgery will not be allowed to drive home. IF YOU ARE HAVING SURGERY AND GOING HOME THE SAME DAY, YOU MUST HAVE AN ADULT TO DRIVE YOU HOME AND BE WITH YOU FOR 24 HOURS. YOU MAY GO HOME BY TAXI OR UBER OR ORTHERWISE, BUT AN ADULT MUST ACCOMPANY YOU HOME AND STAY WITH YOU FOR 24 HOURS.  Name and phone number of your driver:  Special Instructions: N/A              Please read over the following fact sheets you were given: _____________________________________________________________________         [facility] - Preparing for Surgery Before surgery, you can play an important role.  Because skin is not sterile, your skin needs to be as free of germs as possible.  You can reduce the number of germs on your skin by washing with  CHG (chlorahexidine gluconate) soap before surgery.  CHG is an antiseptic cleaner which kills germs and bonds with the skin to continue  killing germs even after washing. Please DO NOT use if you have an allergy to CHG or antibacterial soaps.  If your skin becomes reddened/irritated stop using the CHG and inform your nurse when you arrive at Short Stay. Do not shave (including legs and underarms) for at least 48 hours prior to the first CHG shower.  You may shave your face/neck. Please follow these instructions carefully:  1.  Shower with CHG Soap the night before surgery and the  morning of Surgery.  2.  If you choose to wash your hair, wash your hair first as usual with your  normal  shampoo.  3.  After you shampoo, rinse your hair and body thoroughly to remove the  shampoo.                           4.  Use CHG as you would any other liquid soap.  You can apply chg directly  to the skin and wash                       Gently with a scrungie or clean washcloth.  5.  Apply the CHG Soap to your body ONLY FROM THE NECK DOWN.   Do not use on face/ open                           Wound or open sores. Avoid contact with eyes, ears mouth and genitals (private parts).                       Wash face,  Genitals (private parts) with your normal soap.             6.  Wash thoroughly, paying special attention to the area where your surgery  will be performed.  7.  Thoroughly rinse your body with warm water from the neck down.  8.  DO NOT shower/wash with your normal soap after using and rinsing off  the CHG Soap.                9.  Pat yourself dry with a clean towel.            10.  Wear clean pajamas.            11.  Place clean sheets on your bed the night of your first shower and do not  sleep with pets. Day of Surgery : Do not apply any lotions/deodorants the morning of surgery.  Please wear clean clothes to the hospital/surgery center.  FAILURE TO FOLLOW THESE INSTRUCTIONS MAY RESULT IN THE CANCELLATION OF YOUR SURGERY PATIENT SIGNATURE_________________________________  NURSE  SIGNATURE__________________________________  ________________________________________________________________________ [facility]    Before surgery, you can play an important role. Because skin is not sterile, your skin needs to be as free of germs as possible. You can reduce the number of germs on your skin by using the following products. . Benzoyl Peroxide Gel o Reduces the number of germs present on the skin o Applied twice a day to shoulder area starting two days before surgery    ==================================================================  Please follow these instructions carefully:  BENZOYL PEROXIDE 5% GEL  Please do not use if you have an allergy to  benzoyl peroxide.   If your skin becomes reddened/irritated stop using the benzoyl peroxide.  Starting two days before surgery, apply as follows: 1. Apply benzoyl peroxide in the morning and at night. Apply after taking a shower. If you are not taking a shower clean entire shoulder front, back, and side along with the armpit with a clean wet washcloth.  2. Place a quarter-sized dollop on your shoulder and rub in thoroughly, making sure to cover the front, back, and side of your shoulder, along with the armpit.   2 days before ____ AM   ____ PM              1 day before ____ AM   ____ PM                         3. Do this twice a day for two days.  (Last application is the night before surgery, AFTER using the CHG soap as described below).  4. Do NOT apply benzoyl peroxide gel on the day of surgery.   Incentive Spirometer  An incentive spirometer is a tool that can help keep your lungs clear and active. This tool measures how well you are filling your lungs with each breath. Taking long deep breaths may help reverse or decrease the chance of developing breathing (pulmonary) problems (especially infection) following:  A long period of time when you are unable to move or be  active. BEFORE THE PROCEDURE   If the spirometer includes an indicator to show your best effort, your nurse or respiratory therapist will set it to a desired goal.  If possible, sit up straight or lean slightly forward. Try not to slouch.  Hold the incentive spirometer in an upright position. INSTRUCTIONS FOR USE  1. Sit on the edge of your bed if possible, or sit up as far as you can in bed or on a chair. 2. Hold the incentive spirometer in an upright position. 3. Breathe out normally. 4. Place the mouthpiece in your mouth and seal your lips tightly around it. 5. Breathe in slowly and as deeply as possible, raising the piston or the ball toward the top of the column. 6. Hold your breath for 3-5 seconds or for as long as possible. Allow the piston or ball to fall to the bottom of the column. 7. Remove the mouthpiece from your mouth and breathe out normally. 8. Rest for a few seconds and repeat Steps 1 through 7 at least 10 times every 1-2 hours when you are awake. Take your time and take a few normal breaths between deep breaths. 9. The spirometer may include an indicator to show your best effort. Use the indicator as a goal to work toward during each repetition. 10. After each set of 10 deep breaths, practice coughing to be sure your lungs are clear. If you have an incision (the cut made at the time of surgery), support your incision when coughing by placing a pillow or rolled up towels firmly against it. Once you are able to get out of bed, walk around indoors and cough well. You may stop using the incentive spirometer when instructed by your caregiver.  RISKS AND COMPLICATIONS  Take your time so you do not get dizzy or light-headed.  If you are in pain, you may need to take or ask for pain medication before doing incentive spirometry. It is harder to take a deep breath if you are having pain. AFTER USE  Rest and breathe slowly and easily.  It can be helpful to keep track of a log of  your progress. Your caregiver can provide you with a simple table to help with this. If you are using the spirometer at home, follow these instructions: [place] IF:   You are having difficultly using the spirometer.  You have trouble using the spirometer as often as instructed.  Your pain medication is not giving enough relief while using the spirometer.  You develop fever of 100.5 F (38.1 C) or higher. SEEK IMMEDIATE MEDICAL CARE IF:   You cough up bloody sputum that had not been present before.  You develop fever of 102 F (38.9 C) or greater.  You develop worsening pain at or near the incision site. MAKE SURE YOU:   Understand these instructions.  Will watch your condition.  Will get help right away if you are not doing well or get worse. Document Released: 01/05/2007 Document Revised: 11/17/2011 Document Reviewed: 03/08/2007 [facility] Patient Information 2014 [place], [place].   ________________________________________________________________________

## 2020-05-10 ENCOUNTER — Other Ambulatory Visit: Payer: Self-pay | Admitting: Internal Medicine

## 2020-05-10 DIAGNOSIS — I10 Essential (primary) hypertension: Secondary | ICD-10-CM

## 2020-05-10 DIAGNOSIS — E559 Vitamin D deficiency, unspecified: Secondary | ICD-10-CM

## 2020-05-10 DIAGNOSIS — I2510 Atherosclerotic heart disease of native coronary artery without angina pectoris: Secondary | ICD-10-CM

## 2020-05-10 DIAGNOSIS — I2583 Coronary atherosclerosis due to lipid rich plaque: Secondary | ICD-10-CM

## 2020-05-10 MED ORDER — CANDESARTAN CILEXETIL 32 MG PO TABS: 32.0000 mg | ORAL_TABLET | Freq: Every day | ORAL | 1 refills | Status: DC

## 2020-05-10 MED ORDER — CHOLECALCIFEROL 50 MCG (2000 UT) PO TABS: 1.0000 | ORAL_TABLET | Freq: Every day | ORAL | 1 refills | Status: DC

## 2020-05-16 ENCOUNTER — Other Ambulatory Visit: Payer: Self-pay

## 2020-05-16 ENCOUNTER — Encounter (HOSPITAL_COMMUNITY)
Admission: RE | Admit: 2020-05-16 | Discharge: 2020-05-16 | Disposition: A | Discharge status: Home / Self Care | Payer: No Typology Code available for payment source | Source: Ambulatory Visit | Attending: Orthopaedic Surgery | Admitting: Orthopaedic Surgery

## 2020-05-16 ENCOUNTER — Encounter (HOSPITAL_COMMUNITY): Payer: Self-pay

## 2020-05-16 DIAGNOSIS — Z01812 Encounter for preprocedural laboratory examination: Secondary | ICD-10-CM | POA: Insufficient documentation

## 2020-05-16 HISTORY — DX: Cardiac murmur, unspecified: R01.1

## 2020-05-16 HISTORY — DX: Pneumonia, unspecified organism: J18.9

## 2020-05-16 HISTORY — DX: Anemia, unspecified: D64.9

## 2020-05-16 LAB — CBC
HCT: 41.9 % (ref 39.0–52.0)
Hemoglobin: 13.7 g/dL (ref 13.0–17.0)
MCH: 30.1 pg (ref 26.0–34.0)
MCHC: 32.7 g/dL (ref 30.0–36.0)
MCV: 92.1 fL (ref 80.0–100.0)
Platelets: 270 10*3/uL (ref 150–400)
RBC: 4.55 MIL/uL (ref 4.22–5.81)
RDW: 13 % (ref 11.5–15.5)
WBC: 6.9 10*3/uL (ref 4.0–10.5)
nRBC: 0 % (ref 0.0–0.2)

## 2020-05-16 LAB — BASIC METABOLIC PANEL
Anion gap: 7 (ref 5–15)
BUN: 15 mg/dL (ref 6–20)
CO2: 25 mmol/L (ref 22–32)
Calcium: 8.8 mg/dL — ABNORMAL LOW (ref 8.9–10.3)
Chloride: 103 mmol/L (ref 98–111)
Creatinine, Ser: 0.82 mg/dL (ref 0.61–1.24)
GFR calc Af Amer: >60 mL/min (ref >60)
GFR calc non Af Amer: >60 mL/min (ref >60)
Glucose, Bld: 104 mg/dL — ABNORMAL HIGH (ref 70–99)
Potassium: 4.1 mmol/L (ref 3.5–5.1)
Sodium: 135 mmol/L (ref 135–145)

## 2020-05-16 LAB — SURGICAL PCR SCREEN
MRSA, PCR: NEGATIVE
Staphylococcus aureus: NEGATIVE

## 2020-05-16 NOTE — Progress Notes (Signed)
COVID Vaccine Completed: Yes Date COVID Vaccine completed:12/23/19 COVID vaccine manufacturer: Pfizer   PCP - Dr. Sanda Linger. LOV: 03/27/20 Cardiologist -  Dr. Donato Schultz. LOV: 02/17/20. Clearance: Edd Fabian: 03/08/20. EPIC  Chest x-ray -  EKG - 02/17/20. EPIC Stress Test -  ECHO - 02/17/20 Cardiac Cath -   Sleep Study -  CPAP -   Fasting Blood Sugar -  Checks Blood Sugar _____ times a day  Blood Thinner Instructions: Aspirin Instructions: Last Dose:  Anesthesia review: Hx: HTN,MI  Patient denies shortness of breath, fever, cough and chest pain at PAT appointment   Patient verbalized understanding of instructions that were given to them at the PAT appointment. Patient was also instructed that they will need to review over the PAT instructions again at home before surgery.

## 2020-05-17 NOTE — Progress Notes (Signed)
Anesthesia Chart Review   Case: [id number] Date/Time: 05/23/20 1045   Procedure: REVERSE SHOULDER ARTHROPLASTY (Right Shoulder)   Anesthesia type: Choice   Pre-op diagnosis: right djd shoulder   Location: [facility] ROOM 09 / [facility]   Surgeons: Bjorn Pippin, MD      DISCUSSION: 49 y.o. never smoker with h/o HTN, HLD, CAD (stent x2 2013), moderate AS (AV area 1.23 cm2, mean gradient 34 mmHg on Echo 02/15/2020), right shoulder djd scheduled for above procedure 05/23/2020 with Dr. Ramond Marrow.   Per cardiology preoperative risk assessment 03/08/2020, "Chart reviewed as part of pre-operative protocol coverage. Given past medical history and time since last visit, based on ACC/AHA guidelines, Gabriel Anderson would be at acceptable risk for the planned procedure without further cardiovascular testing.  He may hold his aspirin for 5 days prior to his procedure.  Please resume as soon as hemostasis is achieved."  Anticipate pt can proceed with planned procedure barring acute status change.   VS: BP (!) 142/83   Pulse (!) 59   Temp 36.8 C (Oral)   Resp 20   Ht 5\' 10"  (1.778 m)   Wt 108.9 kg   BMI 34.44 kg/m   PROVIDERS: , MD is PCP   [name], MD is Cardiologist  LABS: Labs reviewed: Acceptable for surgery. (all labs ordered are listed, but only abnormal results are displayed)  Labs Reviewed  BASIC METABOLIC PANEL - Abnormal; Notable for the following components:      Result Value   Glucose, Bld 104 (*)    Calcium 8.8 (*)    All other components within normal limits  SURGICAL PCR SCREEN  CBC     IMAGES:   EKG: 02/17/2020 Rate 78 bpm   CV: Echo 02/15/2020 IMPRESSIONS    1. Left ventricular ejection fraction, by estimation, is 60 to 65%. The  left ventricle has normal function. The left ventricle has no regional  wall motion abnormalities. Left ventricular diastolic parameters are  consistent with Grade I diastolic  dysfunction (impaired relaxation).  2. Right  ventricular systolic function is normal. The right ventricular  size is normal.  3. The mitral valve is normal in structure. No evidence of mitral valve  regurgitation. No evidence of mitral stenosis.  4. The aortic valve has an indeterminant number of cusps. Aortic valve  regurgitation is mild to moderate. Moderate aortic valve stenosis. Aortic  regurgitation PHT measures 501 msec. Aortic valve area, by VTI measures  1.23 cm. Aortic valve mean gradient  measures 34.0 mmHg. Aortic valve Vmax measures 3.76 m/s.  5. Aortic dilatation noted. There is mild dilatation of the ascending  aorta measuring 44 mm.  6. The inferior vena cava is normal in size with greater than 50%  respiratory variability, suggesting right atrial pressure of 3 mmHg.   Comparison(s): Two echocardiograms ago, mean aortic valve gradient was  04/16/2020. Similar with current echo.  Past Medical History:  Diagnosis Date  . Acute myocardial infarction, subendocardial infarction, initial episode of care [facility]) 2013   Cardiac catheterization 06/02/12-widely patent previously placed LAD stents. Troponin peaked 3. Normal EF.  06/04/12 Anemia   . Arthritis   . Coronary atherosclerosis of native coronary artery 05/21/2012   Cardiac catheterization 05/21/12-95% mid LAD lesion at the bifurcation of a large diagonal branch. Normal ejection fraction.  05/23/12 Heart murmur   . Hyperlipidemia   . Hypertension   . Pneumonia   . Raynaud's disease   . Vasospasm (HCC)  takes imdur for    Past Surgical History:  Procedure Laterality Date  .  cardiacstent x 2  2013  . CARDIAC CATHETERIZATION  05/2012   PCI LAD at takeoff of large diagonal  . ELBOW SURGERY Left 1984  . LEFT HEART CATHETERIZATION WITH CORONARY ANGIOGRAM N/A 05/21/2012   Procedure: LEFT HEART CATHETERIZATION WITH CORONARY ANGIOGRAM;  Surgeon: Corky Crafts, MD;  Location: [facility] CATH LAB;  Service: Cardiovascular;  Laterality: N/A;  . LEFT HEART CATHETERIZATION WITH CORONARY  ANGIOGRAM N/A 06/02/2012   Procedure: LEFT HEART CATHETERIZATION WITH CORONARY ANGIOGRAM;  Surgeon: Donato Schultz, MD;  Location: [facility] CATH LAB;  Service: Cardiovascular;  Laterality: N/A;  . PERCUTANEOUS CORONARY STENT INTERVENTION (PCI-S)  05/21/2012   Procedure: PERCUTANEOUS CORONARY STENT INTERVENTION (PCI-S);  Surgeon: Corky Crafts, MD;  Location: [facility] CATH LAB;  Service: Cardiovascular;;  . ROTATOR CUFF REPAIR Right 2009  . TOTAL HIP ARTHROPLASTY Left 12/30/2013   Procedure: LEFT TOTAL HIP ARTHROPLASTY ANTERIOR APPROACH;  Surgeon: Kathryne Hitch, MD;  Location[facility];  Service: Orthopedics;  Laterality: Left;    MEDICATIONS: . amoxicillin (AMOXIL) 500 MG tablet  . aspirin 81 MG tablet  . candesartan (ATACAND) 32 MG tablet  . Cholecalciferol 50 MCG (2000 UT) TABS  . nitroGLYCERIN (NITROSTAT) 0.4 MG SL tablet  . rosuvastatin (CRESTOR) 40 MG tablet  . scopolamine (TRANSDERM-SCOP) 1 MG/3DAYS   No current facility-administered medications for this encounter.     Jodell Cipro, PA-C [facility] Testing [phone]

## 2020-05-17 NOTE — Anesthesia Preprocedure Evaluation (Addendum)
Anesthesia Evaluation  Patient identified by MRN, date of birth, ID band Patient awake    Reviewed: Allergy & Precautions, NPO status , Patient's Chart, lab work & pertinent test results  Airway Mallampati: II  TM Distance: >3 FB Neck ROM: Full    Dental no notable dental hx. (+) Poor Dentition, Missing, Dental Advisory Given,    Pulmonary neg pulmonary ROS,    Pulmonary exam normal breath sounds clear to auscultation       Cardiovascular hypertension, Pt. on medications + CAD (LAD stent), + Past MI and + Cardiac Stents  Normal cardiovascular exam+ Valvular Problems/Murmurs (moderate AS) AS  Rhythm:Regular Rate:Normal  Echo 02/15/20: 1. Left ventricular ejection fraction, by estimation, is 60 to 65%. The  left ventricle has normal function. The left ventricle has no regional  wall motion abnormalities. Left ventricular diastolic parameters are  consistent with Grade I diastolic  dysfunction (impaired relaxation).  2. Right ventricular systolic function is normal. The right ventricular  size is normal.  3. The mitral valve is normal in structure. No evidence of mitral valve  regurgitation. No evidence of mitral stenosis.  4. The aortic valve has an indeterminant number of cusps. Aortic valve  regurgitation is mild to moderate. Moderate aortic valve stenosis. Aortic  regurgitation PHT measures 501 msec. Aortic valve area, by VTI measures  1.23 cm. Aortic valve mean gradient  measures 34.0 mmHg. Aortic valve Vmax measures 3.76 m/s.  5. Aortic dilatation noted. There is mild dilatation of the ascending  aorta measuring 44 mm.  6. The inferior vena cava is normal in size with greater than 50%  respiratory variability, suggesting right atrial pressure of 3 mmHg.    Neuro/Psych negative neurological ROS  negative psych ROS   GI/Hepatic negative GI ROS, (+)     substance abuse (hx EtOH in past, not recently, last marijuana  last night)  alcohol use and marijuana use,   Endo/Other  Obesity BMI 34  Renal/GU negative Renal ROS  negative genitourinary   Musculoskeletal  (+) Arthritis , Osteoarthritis,  Right shoulder DJD   Abdominal (+) + obese,   Peds  Hematology negative hematology ROS (+) hct 41.9, plt 270   Anesthesia Other Findings   Reproductive/Obstetrics negative OB ROS                          Anesthesia Physical Anesthesia Plan  ASA: III  Anesthesia Plan: General and Regional   Post-op Pain Management: GA combined w/ Regional for post-op pain   Induction: Intravenous  PONV Risk Score and Plan: 2 and Ondansetron, Dexamethasone, Midazolam and Treatment may vary due to age or medical condition  Airway Management Planned: Oral ETT  Additional Equipment: None  Intra-op Plan:   Post-operative Plan: Extubation in OR  Informed Consent: I have reviewed the patients History and Physical, chart, labs and discussed the procedure including the risks, benefits and alternatives for the proposed anesthesia with the patient or authorized representative who has indicated his/her understanding and acceptance.     Dental advisory given  Plan Discussed with: CRNA  Anesthesia Plan Comments:       Anesthesia Quick Evaluation

## 2020-05-19 ENCOUNTER — Other Ambulatory Visit (HOSPITAL_COMMUNITY)
Admission: RE | Admit: 2020-05-19 | Discharge: 2020-05-19 | Disposition: A | Discharge status: Home / Self Care | Payer: No Typology Code available for payment source | Source: Ambulatory Visit | Attending: Orthopaedic Surgery | Admitting: Orthopaedic Surgery

## 2020-05-19 DIAGNOSIS — Z01812 Encounter for preprocedural laboratory examination: Secondary | ICD-10-CM | POA: Diagnosis not present

## 2020-05-19 DIAGNOSIS — Z20822 Contact with and (suspected) exposure to covid-19: Secondary | ICD-10-CM | POA: Insufficient documentation

## 2020-05-19 LAB — SARS CORONAVIRUS 2 (TAT 6-24 HRS): SARS Coronavirus 2: NEGATIVE

## 2020-05-22 NOTE — Progress Notes (Signed)
Pt. Was informed about the surgical time change.He is aware to arrive at 7:30 am,also to drink ensure at 7:00 am.

## 2020-05-22 NOTE — H&P (Signed)
PREOPERATIVE H&P  Chief Complaint: right djd shoulder  HPI: Gabriel Anderson is a 49 y.o. male who is scheduled for Procedure(s): REVERSE SHOULDER ARTHROPLASTY.   [name] significant for vasospasm, Raynaud's, HTN, hyperlipidemia, heart murmur, CAD with history of 2 stents.   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  He had a rotator cuff repair about 10-12 years ago with Dr. Magnus Ivan.  He did reasonably after this, but he never had full motion or strength.  He has had worsening function over the last few weeks.  He does have a heart history with two stents, problem in his LAD, and has a valve with only 45% ejection fraction.  Dr. Donato Schultz is his primary cardiologist.  He fell on November 14, 2019 and feels that the shoulder became much worse after that.  Right hand dominant at baseline.    His symptoms are rated as moderate to severe, and have been worsening.  [name].    Please see clinic note for further details on this patient's care.    He has elected for surgical management.   Past Medical History:  Diagnosis Date  . Acute myocardial infarction, subendocardial infarction, initial episode of care [facility]) 2013   Cardiac catheterization 06/02/12-widely patent previously placed LAD stents. Troponin peaked 3. Normal EF.  [name] Anemia   . Arthritis   . Coronary atherosclerosis of native coronary artery 05/21/2012   Cardiac catheterization 05/21/12-95% mid LAD lesion at the bifurcation of a large diagonal branch. Normal ejection fraction.  [name] Heart murmur   . Hyperlipidemia   . Hypertension   . Pneumonia   . Raynaud's disease   . Vasospasm (HCC)    takes imdur for   Past Surgical History:  Procedure Laterality Date  .  cardiacstent x 2  2013  . CARDIAC CATHETERIZATION  05/2012   PCI LAD at takeoff of large diagonal  . ELBOW SURGERY Left 1984    . LEFT HEART CATHETERIZATION WITH CORONARY ANGIOGRAM N/A 05/21/2012   Procedure: LEFT HEART CATHETERIZATION WITH CORONARY ANGIOGRAM;  Surgeon: Corky Crafts, MD;  Location: [facility] CATH LAB;  Service: Cardiovascular;  Laterality: N/A;  . LEFT HEART CATHETERIZATION WITH CORONARY ANGIOGRAM N/A 06/02/2012   Procedure: LEFT HEART CATHETERIZATION WITH CORONARY ANGIOGRAM;  Surgeon: Donato Schultz, MD;  Location: [facility] CATH LAB;  Service: Cardiovascular;  Laterality: N/A;  . PERCUTANEOUS CORONARY STENT INTERVENTION (PCI-S)  05/21/2012   Procedure: PERCUTANEOUS CORONARY STENT INTERVENTION (PCI-S);  Surgeon: Corky Crafts, MD;  Location: [facility] CATH LAB;  Service: Cardiovascular;;  . ROTATOR CUFF REPAIR Right 2009  . TOTAL HIP ARTHROPLASTY Left 12/30/2013   Procedure: LEFT TOTAL HIP ARTHROPLASTY ANTERIOR APPROACH;  Surgeon: Kathryne Hitch, MD;  Location[facility];  Service: Orthopedics;  Laterality: Left;   Social History   Socioeconomic History  . Marital status: Married    Spouse name: Not on file  . Number of children: Not on file  . Years of education: Not on file  . Highest education level: Not on file  Occupational History  . Not on file  Tobacco Use  . Smoking status: Never Smoker  . Smokeless tobacco: Never Used  Vaping Use  . Vaping Use: Never used  Substance and Sexual Activity  . Alcohol use: Yes    Alcohol/week: 34.0 standard drinks    Types: 28 Cans of beer, 2  Shots of liquor, 4 Standard drinks or equivalent per week    Comment: moderate  . Drug use: Yes    Types: Marijuana    Comment: marijuana 0.05 grams smoked  per day for joint pain  . Sexual activity: Yes    Partners: Female  Other Topics Concern  . Not on file  Social History Narrative  . Not on file   Social Determinants of Health   Financial Resource Strain:   . Difficulty of Paying Living Expenses: Not on file  Food Insecurity:   . Worried About Programme researcher, broadcasting/film/video in the Last Year: Not on file  . Ran Out  of Food in the Last Year: Not on file  Transportation Needs:   . Lack of Transportation (Medical): Not on file  . Lack of Transportation (Non-Medical): Not on file  Physical Activity:   . Days of Exercise per Week: Not on file  . Minutes of Exercise per Session: Not on file  Stress:   . Feeling of Stress : Not on file  Social Connections:   . Frequency of Communication with Friends and Family: Not on file  . Frequency of Social Gatherings with Friends and Family: Not on file  . Attends Religious Services: Not on file  . Active Member of Clubs or Organizations: Not on file  . Attends Banker Meetings: Not on file  . Marital Status: Not on file   Family History  Problem Relation Age of Onset  . Hypertension Father   . Diabetes Father   . Heart failure Father   . Heart disease Father   . Stroke Sister   . COPD Mother    No Known Allergies Prior to Admission medications   Medication Sig Start Date End Date Taking? Authorizing Provider  aspirin 81 MG tablet Take 1 tablet (81 mg total) by mouth daily. 04/09/16  Yes Jake Bathe, MD  Cholecalciferol 50 MCG (2000 UT) TABS Take 1 tablet (2,000 Units total) by mouth daily. 05/10/20  Yes [name], MD  rosuvastatin (CRESTOR) 40 MG tablet Take 1 tablet (40 mg total) by mouth daily. 02/17/20  Yes Jake Bathe, MD  amoxicillin (AMOXIL) 500 MG tablet Take 500 mg by mouth 3 (three) times daily. Patient not taking: Reported on 05/10/2020 03/20/20   [provider]  candesartan (ATACAND) 32 MG tablet Take 1 tablet (32 mg total) by mouth daily. 05/10/20   [name], MD  nitroGLYCERIN (NITROSTAT) 0.4 MG SL tablet Place 1 tablet (0.4 mg total) under the tongue every 5 (five) minutes as needed. Patient not taking: Reported on 03/27/2020 10/20/17   Jake Bathe, MD  scopolamine (TRANSDERM-SCOP) 1 MG/3DAYS Place 1 patch (1.5 mg total) onto the skin every 3 (three) days. Patient not taking: Reported on 03/27/2020 02/22/20    [name], MD    ROS: All other systems have been reviewed and were otherwise negative with the exception of those mentioned in the HPI and as above.  Physical Exam: General: Alert, no acute distress Cardiovascular: No pedal edema Respiratory: No cyanosis, no use of accessory musculature GI: No organomegaly, abdomen is soft and non-tender Skin: No lesions in the area of chief complaint Neurologic: Sensation intact distally Psychiatric: Patient is competent for consent with normal mood and affect Lymphatic: No axillary or cervical lymphadenopathy  MUSCULOSKELETAL:  Examination of the right shoulder demonstrates active forward elevation to about 90, passive to 130 with significant pain during range of motion.  Hard  stop at that point.  External rotation of 30 versus 60.  Cuff strength is 4/5 supraspinatus, infraspinatus and subscapularis.    Imaging: X-rays and MRI reviewed demonstrating an acromiohumeral interval less than 1 millimeter.  On x-rays today he has clear osteophytosis and joint space narrowing with irregularities of the chondral surfaces and an articulation of his acromion with his humeral head on his MRI as well.  Irreparable cuff tear is also noted with anchors in place.    Assessment: Irreparable right cuff tear, rotator cuff arthropathy.  [place] 4B.    Plan: Plan for Procedure(s): REVERSE SHOULDER ARTHROPLASTY  The risks benefits and alternatives were discussed with the patient including but not limited to the risks of nonoperative treatment, versus surgical intervention including infection, bleeding, nerve injury,  blood clots, cardiopulmonary complications, morbidity, mortality, among others, and they were willing to proceed.   We additionally specifically discussed risks of axillary nerve injury, infection, periprosthetic fracture, continued pain and longevity of implants prior to beginning procedure.    Patient will be closely monitored in PACU for medical  stabilization and pain control. If found stable in PACU, patient may be discharged home with outpatient follow-up. If any concerns regarding patient's stabilization patient will be admitted for observation after surgery. The patient is planning to be discharged home with outpatient PT.   The patient acknowledged the explanation, agreed to proceed with the plan and consent was signed.   Patient has received clearance from dentist, Dr. Currie Paris, primary care provider, Dr. Yetta Barre, and cardiologist, Dr. Donato Schultz.   Operative Plan: Right reverse total shoulder arthroplasty Discharge Medications: Tylenol, Celebrex, Oxycodone, Zofran DVT Prophylaxis: Resume Aspirin Physical Therapy: Outpatient PT Special Discharge needs: Sling   Vernetta Honey, PA-C  05/22/2020 4:15 PM

## 2020-05-23 ENCOUNTER — Ambulatory Visit (HOSPITAL_COMMUNITY): Payer: No Typology Code available for payment source | Admitting: Certified Registered Nurse Anesthetist

## 2020-05-23 ENCOUNTER — Ambulatory Visit (HOSPITAL_COMMUNITY): Payer: No Typology Code available for payment source

## 2020-05-23 ENCOUNTER — Ambulatory Visit (HOSPITAL_COMMUNITY)
Admission: RE | Admit: 2020-05-23 | Discharge: 2020-05-23 | Disposition: A | Discharge status: Home / Self Care | Payer: No Typology Code available for payment source | Attending: Orthopaedic Surgery | Admitting: Orthopaedic Surgery

## 2020-05-23 ENCOUNTER — Encounter (HOSPITAL_COMMUNITY): Payer: Self-pay | Admitting: Orthopaedic Surgery

## 2020-05-23 ENCOUNTER — Encounter (HOSPITAL_COMMUNITY)
Admission: RE | Disposition: A | Discharge status: Home / Self Care | Payer: Self-pay | Source: Home / Self Care | Attending: Orthopaedic Surgery

## 2020-05-23 ENCOUNTER — Other Ambulatory Visit: Payer: No Typology Code available for payment source

## 2020-05-23 ENCOUNTER — Ambulatory Visit (HOSPITAL_COMMUNITY): Payer: No Typology Code available for payment source | Admitting: Physician Assistant

## 2020-05-23 DIAGNOSIS — I252 Old myocardial infarction: Secondary | ICD-10-CM | POA: Insufficient documentation

## 2020-05-23 DIAGNOSIS — S46011A Strain of muscle(s) and tendon(s) of the rotator cuff of right shoulder, initial encounter: Secondary | ICD-10-CM | POA: Diagnosis not present

## 2020-05-23 DIAGNOSIS — I10 Essential (primary) hypertension: Secondary | ICD-10-CM | POA: Diagnosis not present

## 2020-05-23 DIAGNOSIS — E669 Obesity, unspecified: Secondary | ICD-10-CM | POA: Insufficient documentation

## 2020-05-23 DIAGNOSIS — Z7982 Long term (current) use of aspirin: Secondary | ICD-10-CM | POA: Insufficient documentation

## 2020-05-23 DIAGNOSIS — Z96642 Presence of left artificial hip joint: Secondary | ICD-10-CM | POA: Diagnosis not present

## 2020-05-23 DIAGNOSIS — Z6834 Body mass index (BMI) 34.0-34.9, adult: Secondary | ICD-10-CM | POA: Diagnosis not present

## 2020-05-23 DIAGNOSIS — Z955 Presence of coronary angioplasty implant and graft: Secondary | ICD-10-CM | POA: Diagnosis not present

## 2020-05-23 DIAGNOSIS — Z09 Encounter for follow-up examination after completed treatment for conditions other than malignant neoplasm: Secondary | ICD-10-CM

## 2020-05-23 DIAGNOSIS — E785 Hyperlipidemia, unspecified: Secondary | ICD-10-CM | POA: Insufficient documentation

## 2020-05-23 DIAGNOSIS — M19011 Primary osteoarthritis, right shoulder: Secondary | ICD-10-CM | POA: Diagnosis not present

## 2020-05-23 DIAGNOSIS — I2510 Atherosclerotic heart disease of native coronary artery without angina pectoris: Secondary | ICD-10-CM | POA: Insufficient documentation

## 2020-05-23 DIAGNOSIS — Z79899 Other long term (current) drug therapy: Secondary | ICD-10-CM | POA: Diagnosis not present

## 2020-05-23 DIAGNOSIS — W19XXXA Unspecified fall, initial encounter: Secondary | ICD-10-CM | POA: Insufficient documentation

## 2020-05-23 HISTORY — PX: REVERSE SHOULDER ARTHROPLASTY: SHX5054

## 2020-05-23 SURGERY — ARTHROPLASTY, SHOULDER, TOTAL, REVERSE
Anesthesia: Regional | Site: Shoulder | Laterality: Right

## 2020-05-23 MED ORDER — PROPOFOL 10 MG/ML IV BOLUS
INTRAVENOUS | Status: DC | PRN
  Administered 2020-05-23: 160 mg via INTRAVENOUS

## 2020-05-23 MED ORDER — PHENYLEPHRINE HCL-NACL 10-0.9 MG/250ML-% IV SOLN
INTRAVENOUS | Status: DC | PRN
  Administered 2020-05-23: 40 ug/min via INTRAVENOUS

## 2020-05-23 MED ORDER — LACTATED RINGERS IV SOLN
INTRAVENOUS | Status: DC
  Administered 2020-05-23: 1000 mL via INTRAVENOUS

## 2020-05-23 MED ORDER — CEFAZOLIN SODIUM-DEXTROSE 2-4 GM/100ML-% IV SOLN
2.0000 g | INTRAVENOUS | Status: AC
  Administered 2020-05-23: 2 g via INTRAVENOUS
  Filled 2020-05-23: qty 100

## 2020-05-23 MED ORDER — VANCOMYCIN HCL 1 G IV SOLR
INTRAVENOUS | Status: DC | PRN
  Administered 2020-05-23: 1000 mg via TOPICAL

## 2020-05-23 MED ORDER — ORAL CARE MOUTH RINSE: 15.0000 mL | Freq: Once | OROMUCOSAL | Status: AC

## 2020-05-23 MED ORDER — BUPIVACAINE LIPOSOME 1.3 % IJ SUSP
INTRAMUSCULAR | Status: DC | PRN
  Administered 2020-05-23: 10 mL via PERINEURAL

## 2020-05-23 MED ORDER — PHENYLEPHRINE HCL (PRESSORS) 10 MG/ML IV SOLN
INTRAVENOUS | Status: AC
  Filled 2020-05-23: qty 1

## 2020-05-23 MED ORDER — KETOROLAC TROMETHAMINE 30 MG/ML IJ SOLN: 30.0000 mg | Freq: Once | INTRAMUSCULAR | Status: DC | PRN

## 2020-05-23 MED ORDER — ROCURONIUM BROMIDE 10 MG/ML (PF) SYRINGE
PREFILLED_SYRINGE | INTRAVENOUS | Status: AC
  Filled 2020-05-23: qty 10

## 2020-05-23 MED ORDER — VANCOMYCIN HCL 1000 MG IV SOLR
INTRAVENOUS | Status: AC
  Filled 2020-05-23: qty 1000

## 2020-05-23 MED ORDER — ROCURONIUM BROMIDE 10 MG/ML (PF) SYRINGE
PREFILLED_SYRINGE | INTRAVENOUS | Status: DC | PRN
  Administered 2020-05-23: 100 mg via INTRAVENOUS

## 2020-05-23 MED ORDER — FENTANYL CITRATE (PF) 100 MCG/2ML IJ SOLN
INTRAMUSCULAR | Status: DC | PRN
Start: 2020-05-23 — End: 2020-05-23
  Administered 2020-05-23: 100 ug via INTRAVENOUS

## 2020-05-23 MED ORDER — LACTATED RINGERS IV BOLUS
500.0000 mL | Freq: Once | INTRAVENOUS | Status: AC
  Administered 2020-05-23: 500 mL via INTRAVENOUS

## 2020-05-23 MED ORDER — ONDANSETRON HCL 4 MG/2ML IJ SOLN
INTRAMUSCULAR | Status: AC
  Filled 2020-05-23: qty 2

## 2020-05-23 MED ORDER — HYDROMORPHONE HCL 1 MG/ML IJ SOLN
INTRAMUSCULAR | Status: AC
  Filled 2020-05-23: qty 2

## 2020-05-23 MED ORDER — SODIUM CHLORIDE 0.9 % IR SOLN
Status: DC | PRN
  Administered 2020-05-23: 1000 mL

## 2020-05-23 MED ORDER — ONDANSETRON HCL 4 MG/2ML IJ SOLN
INTRAMUSCULAR | Status: DC | PRN
  Administered 2020-05-23: 4 mg via INTRAVENOUS

## 2020-05-23 MED ORDER — MIDAZOLAM HCL 2 MG/2ML IJ SOLN
1.0000 mg | INTRAMUSCULAR | Status: DC
  Administered 2020-05-23: 2 mg via INTRAVENOUS
  Filled 2020-05-23: qty 2

## 2020-05-23 MED ORDER — DEXAMETHASONE SODIUM PHOSPHATE 10 MG/ML IJ SOLN
INTRAMUSCULAR | Status: AC
  Filled 2020-05-23: qty 1

## 2020-05-23 MED ORDER — 0.9 % SODIUM CHLORIDE (POUR BTL) OPTIME
TOPICAL | Status: DC | PRN
  Administered 2020-05-23: 1000 mL

## 2020-05-23 MED ORDER — ACETAMINOPHEN 500 MG PO TABS: 1000.0000 mg | ORAL_TABLET | Freq: Three times a day (TID) | ORAL | 0 refills | Status: AC

## 2020-05-23 MED ORDER — FENTANYL CITRATE (PF) 100 MCG/2ML IJ SOLN
50.0000 ug | INTRAMUSCULAR | Status: DC
  Administered 2020-05-23: 100 ug via INTRAVENOUS
  Filled 2020-05-23: qty 2

## 2020-05-23 MED ORDER — ONDANSETRON HCL 4 MG PO TABS: 4.0000 mg | ORAL_TABLET | Freq: Three times a day (TID) | ORAL | 1 refills | Status: AC | PRN

## 2020-05-23 MED ORDER — HYDROMORPHONE HCL 1 MG/ML IJ SOLN
0.2500 mg | INTRAMUSCULAR | Status: DC | PRN
  Administered 2020-05-23 (×4): 0.5 mg via INTRAVENOUS

## 2020-05-23 MED ORDER — CHLORHEXIDINE GLUCONATE 0.12 % MT SOLN
15.0000 mL | Freq: Once | OROMUCOSAL | Status: AC
  Administered 2020-05-23: 15 mL via OROMUCOSAL

## 2020-05-23 MED ORDER — ACETAMINOPHEN 500 MG PO TABS
1000.0000 mg | ORAL_TABLET | Freq: Once | ORAL | Status: AC
  Administered 2020-05-23: 1000 mg via ORAL
  Filled 2020-05-23: qty 2

## 2020-05-23 MED ORDER — FENTANYL CITRATE (PF) 100 MCG/2ML IJ SOLN
INTRAMUSCULAR | Status: AC
  Filled 2020-05-23: qty 2

## 2020-05-23 MED ORDER — CELECOXIB 100 MG PO CAPS: 100.0000 mg | ORAL_CAPSULE | Freq: Two times a day (BID) | ORAL | 0 refills | Status: AC

## 2020-05-23 MED ORDER — EPHEDRINE SULFATE-NACL 50-0.9 MG/10ML-% IV SOSY
PREFILLED_SYRINGE | INTRAVENOUS | Status: DC | PRN
  Administered 2020-05-23: 5 mg via INTRAVENOUS

## 2020-05-23 MED ORDER — LIDOCAINE 2% (20 MG/ML) 5 ML SYRINGE
INTRAMUSCULAR | Status: DC | PRN
  Administered 2020-05-23: 20 mg via INTRAVENOUS

## 2020-05-23 MED ORDER — DEXAMETHASONE SODIUM PHOSPHATE 10 MG/ML IJ SOLN
INTRAMUSCULAR | Status: DC | PRN
  Administered 2020-05-23: 8 mg via INTRAVENOUS

## 2020-05-23 MED ORDER — OXYCODONE HCL 5 MG/5ML PO SOLN: 5.0000 mg | Freq: Once | ORAL | Status: AC | PRN

## 2020-05-23 MED ORDER — OXYCODONE HCL 5 MG PO TABS
5.0000 mg | ORAL_TABLET | Freq: Once | ORAL | Status: AC | PRN
  Administered 2020-05-23: 5 mg via ORAL

## 2020-05-23 MED ORDER — BUPIVACAINE HCL (PF) 0.5 % IJ SOLN
INTRAMUSCULAR | Status: DC | PRN
  Administered 2020-05-23: 15 mL via PERINEURAL

## 2020-05-23 MED ORDER — TRANEXAMIC ACID-NACL 1000-0.7 MG/100ML-% IV SOLN
1000.0000 mg | INTRAVENOUS | Status: AC
  Administered 2020-05-23: 1000 mg via INTRAVENOUS
  Filled 2020-05-23: qty 100

## 2020-05-23 MED ORDER — PROMETHAZINE HCL 25 MG/ML IJ SOLN: 6.2500 mg | INTRAMUSCULAR | Status: DC | PRN

## 2020-05-23 MED ORDER — OXYCODONE HCL 5 MG PO TABS: ORAL_TABLET | ORAL | 0 refills | Status: AC

## 2020-05-23 MED ORDER — SUGAMMADEX SODIUM 200 MG/2ML IV SOLN
INTRAVENOUS | Status: DC | PRN
  Administered 2020-05-23: 200 mg via INTRAVENOUS

## 2020-05-23 MED ORDER — OXYCODONE HCL 5 MG PO TABS
ORAL_TABLET | ORAL | Status: AC
  Filled 2020-05-23: qty 1

## 2020-05-23 MED ORDER — LIDOCAINE 2% (20 MG/ML) 5 ML SYRINGE
INTRAMUSCULAR | Status: AC
  Filled 2020-05-23: qty 5

## 2020-05-23 MED ORDER — STERILE WATER FOR IRRIGATION IR SOLN
Status: DC | PRN
  Administered 2020-05-23: 2000 mL

## 2020-05-23 MED ORDER — MEPERIDINE HCL 50 MG/ML IJ SOLN: 6.2500 mg | INTRAMUSCULAR | Status: DC | PRN

## 2020-05-23 SURGICAL SUPPLY — 70 items
AID PSTN UNV HD RSTRNT DISP (MISCELLANEOUS) ×1
APL PRP STRL LF DISP 70% ISPRP (MISCELLANEOUS) ×2
BASEPLATE GLENOID RSA 3X25 0D (Shoulder) ×2 IMPLANT
BIT DRILL 3.2 PERIPHERAL SCREW (BIT) ×2 IMPLANT
BLADE SAW SAG 73X25 THK (BLADE) ×2
BLADE SAW SGTL 73X25 THK (BLADE) ×1 IMPLANT
BSPLAT GLND +3 25 (Shoulder) ×1 IMPLANT
CHLORAPREP W/TINT 26 (MISCELLANEOUS) ×6 IMPLANT
CLOSURE STERI-STRIP 1/2X4 (GAUZE/BANDAGES/DRESSINGS) ×1
CLSR STERI-STRIP ANTIMIC 1/2X4 (GAUZE/BANDAGES/DRESSINGS) ×2 IMPLANT
COOLER ICEMAN CLASSIC (MISCELLANEOUS) IMPLANT
COVER BACK TABLE 60X90IN (DRAPES) ×2 IMPLANT
COVER SURGICAL LIGHT HANDLE (MISCELLANEOUS) ×3 IMPLANT
COVER WAND RF STERILE (DRAPES) ×3 IMPLANT
DRAPE INCISE IOBAN 66X45 STRL (DRAPES) ×3 IMPLANT
DRAPE SHEET LG 3/4 BI-LAMINATE (DRAPES) ×9 IMPLANT
DRSG AQUACEL AG ADV 3.5X 6 (GAUZE/BANDAGES/DRESSINGS) ×3 IMPLANT
ELECT BLADE TIP CTD 4 INCH (ELECTRODE) ×3 IMPLANT
ELECT REM PT RETURN 15FT ADLT (MISCELLANEOUS) ×3 IMPLANT
GLENOSPHERE STANDARD 39 (Joint) ×3 IMPLANT
GLENOSPHERE STD 39 (Joint) IMPLANT
GLOVE BIO SURGEON STRL SZ 6.5 (GLOVE) ×4 IMPLANT
GLOVE BIO SURGEONS STRL SZ 6.5 (GLOVE) ×2
GLOVE BIOGEL PI IND STRL 6.5 (GLOVE) ×1 IMPLANT
GLOVE BIOGEL PI IND STRL 8 (GLOVE) ×1 IMPLANT
GLOVE BIOGEL PI INDICATOR 6.5 (GLOVE) ×2
GLOVE BIOGEL PI INDICATOR 8 (GLOVE) ×2
GLOVE ECLIPSE 8.0 STRL XLNG CF (GLOVE) ×6 IMPLANT
GOWN STRL REUS W/TWL LRG LVL3 (GOWN DISPOSABLE) ×3 IMPLANT
GOWN STRL REUS W/TWL XL LVL3 (GOWN DISPOSABLE) ×3 IMPLANT
GUIDEWIRE GLENOID 2.5X220 (WIRE) ×2 IMPLANT
HANDPIECE INTERPULSE COAX TIP (DISPOSABLE) ×3
HEMOSTAT SURGICEL 2X14 (HEMOSTASIS) IMPLANT
IMPL REVERSE SHOULDER 0X3.5 (Shoulder) IMPLANT
IMPLANT REVERSE SHOULDER 0X3.5 (Shoulder) ×3 IMPLANT
INSERT REV KIT SHOULDER 6X39 (Screw) ×2 IMPLANT
KIT BASIN OR (CUSTOM PROCEDURE TRAY) ×2 IMPLANT
KIT STABILIZATION SHOULDER (MISCELLANEOUS) ×3 IMPLANT
KIT TURNOVER KIT A (KITS) IMPLANT
MANIFOLD NEPTUNE II (INSTRUMENTS) ×3 IMPLANT
NDL MAYO CATGUT SZ4 TPR NDL (NEEDLE) IMPLANT
NEEDLE MAYO CATGUT SZ4 (NEEDLE) IMPLANT
NS IRRIG 1000ML POUR BTL (IV SOLUTION) ×3 IMPLANT
PACK SHOULDER (CUSTOM PROCEDURE TRAY) ×3 IMPLANT
PAD COLD SHLDR WRAP-ON (PAD) IMPLANT
PENCIL SMOKE EVACUATOR (MISCELLANEOUS) IMPLANT
RESTRAINT HEAD UNIVERSAL NS (MISCELLANEOUS) ×3 IMPLANT
SCREW 5.5X26 (Screw) ×4 IMPLANT
SCREW BONE 6.5 OD 30 NON BIO (Screw) ×3 IMPLANT
SCREW BONE INTRNL SM 7 (Screw) ×2 IMPLANT
SCREW PERIPHERAL 5.0X34 (Screw) ×4 IMPLANT
SET HNDPC FAN SPRY TIP SCT (DISPOSABLE) ×1 IMPLANT
SLING ARM FOAM STRAP LRG (SOFTGOODS) ×3 IMPLANT
SLING ULTRA II L (ORTHOPEDIC SUPPLIES) IMPLANT
SLING ULTRA III MED (ORTHOPEDIC SUPPLIES) ×3 IMPLANT
STEM HUMERAL SZ2B STND 70 PTC (Stem) ×3 IMPLANT
STEM HUMERAL SZ2BSTD 70 PTC (Stem) IMPLANT
SUCTION FRAZIER HANDLE 12FR (TUBING) ×3
SUCTION TUBE FRAZIER 12FR DISP (TUBING) ×1 IMPLANT
SUT ETHIBOND 2 V 37 (SUTURE) ×3 IMPLANT
SUT ETHIBOND NAB CT1 #1 30IN (SUTURE) IMPLANT
SUT FIBERWIRE #5 38 CONV NDL (SUTURE) ×12
SUT MNCRL AB 4-0 PS2 18 (SUTURE) ×3 IMPLANT
SUT VIC AB 0 CT1 36 (SUTURE) IMPLANT
SUT VIC AB 3-0 SH 27 (SUTURE) ×6
SUT VIC AB 3-0 SH 27X BRD (SUTURE) ×1 IMPLANT
SUTURE FIBERWR #5 38 CONV NDL (SUTURE) ×4 IMPLANT
TOWEL OR 17X26 10 PK STRL BLUE (TOWEL DISPOSABLE) ×3 IMPLANT
TUBE SUCTION HIGH CAP CLEAR NV (SUCTIONS) ×3 IMPLANT
WATER STERILE IRR 1000ML POUR (IV SOLUTION) ×6 IMPLANT

## 2020-05-23 NOTE — Progress Notes (Signed)
Assisted Dr. Beth Finucane with right, ultrasound guided, interscalene  block. Side rails up, monitors on throughout procedure. See vital signs in flow sheet. Tolerated Procedure well.  

## 2020-05-23 NOTE — Transfer of Care (Signed)
Immediate Anesthesia Transfer of Care Note  Patient: Gabriel Anderson  Procedure(s) Performed: REVERSE SHOULDER ARTHROPLASTY (Right Shoulder)  Patient Location: PACU  Anesthesia Type:GA combined with regional for post-op pain  Level of Consciousness: drowsy and patient cooperative  Airway & Oxygen Therapy: Patient Spontanous Breathing and Patient connected to face mask oxygen  Post-op Assessment: Report given to RN and Post -op Vital signs reviewed and stable  Post vital signs: Reviewed and stable  Last Vitals:  Vitals Value Taken Time  BP 131/77 05/23/20 1208  Temp    Pulse 74 05/23/20 1212  Resp 15 05/23/20 1212  SpO2 99 % 05/23/20 1212  Vitals shown include unvalidated device data.  Last Pain:  Vitals:   05/23/20 0754  TempSrc: Oral         Complications: No complications documented.

## 2020-05-23 NOTE — Anesthesia Procedure Notes (Signed)
Anesthesia Regional Block: Interscalene brachial plexus block   Pre-Anesthetic Checklist: ,, timeout performed, Correct Patient, Correct Site, Correct Laterality, Correct Procedure, Correct Position, site marked, Risks and benefits discussed,  Surgical consent,  Pre-op evaluation,  At surgeon's request and post-op pain management  Laterality: Right  Prep: Maximum Sterile Barrier Precautions used, chloraprep       Needles:  Injection technique: Single-shot  Needle Type: Echogenic Stimulator Needle     Needle Length: 9cm  Needle Gauge: 22     Additional Needles:   Procedures:,,,, ultrasound used (permanent image in chart),,,,  Narrative:  Start time: 05/23/2020 9:20 AM End time: 05/23/2020 9:25 AM Injection made incrementally with aspirations every 5 mL.  Performed by: Personally  Anesthesiologist: Lannie Fields, DO  Additional Notes: Monitors applied. No increased pain on injection. No increased resistance to injection. Injection made in 5cc increments. Good needle visualization. Patient tolerated procedure well.

## 2020-05-23 NOTE — Anesthesia Procedure Notes (Signed)
Procedure Name: Intubation Date/Time: 05/23/2020 10:09 AM Performed by: Montel Clock, CRNA Pre-anesthesia Checklist: Patient identified, Emergency Drugs available, Suction available, Patient being monitored and Timeout performed Patient Re-evaluated:Patient Re-evaluated prior to induction Oxygen Delivery Method: Circle system utilized Preoxygenation: Pre-oxygenation with 100% oxygen Induction Type: IV induction Ventilation: Mask ventilation without difficulty and Oral airway inserted - appropriate to patient size Laryngoscope Size: Mac and 3 Grade View: Grade II Tube type: Oral Tube size: 7.5 mm Number of attempts: 1 Airway Equipment and Method: Stylet Placement Confirmation: ETT inserted through vocal cords under direct vision,  positive ETCO2 and breath sounds checked- equal and bilateral Secured at: 23 cm Tube secured with: Tape Dental Injury: Teeth and Oropharynx as per pre-operative assessment  Comments: Grade 2 with downward laryngeal pressure. ETT easily passed.

## 2020-05-23 NOTE — Op Note (Signed)
Orthopaedic Surgery Operative Note (CSN: [id number])  Gabriel Anderson  1971-08-06 Date of Surgery: 05/23/2020   Diagnoses:  Miles Costain 4B cuff tear arthropathy  Procedure: Right reverse total Shoulder Arthroplasty   Operative Finding Successful completion of planned procedure.  Patient had significant tightness and adhesions after his previous surgeries.  We removed multiple anchors from his previous surgeries.  No sign of infection.  We did inferiorly tilt his glenoid implant as well as lateralized to try and prevent notching as the patient is going to have a long use of this extremity and implant due to his young age.  We ended up using a press-fit post as his sclerotic bone we directed our drill bit but we had great fixation with our press-fit post.  Post-operative plan: The patient will be NWB in sling.  The patient will be will be discharged from PACU if continues to be stable as was plan prior to surgery.  DVT prophylaxis Aspirin 81 mg twice daily for 6 weeks.  Pain control with PRN pain medication preferring oral medicines.  Follow up plan will be scheduled in approximately 7 days for incision check and XR.  Physical therapy to start after 3 to 4 weeks.  Implants: Tornier size 2B stem, 0 high offset tray, 39+6 poly-, 39 glenosphere with a 3 mm lateralized baseplate and a 7 mm central press-fit post.  Post-Op Diagnosis: Same Surgeons:Primary: Hiram Gash, MD Assistants:Caroline McBane PA-C Location: [facility] ROOM 09 Anesthesia: General with Exparel Interscalene Antibiotics: Ancef 2g preop\, Vancomycin 1000mg  locally Tourniquet time: None Estimated Blood Loss: 149 Complications: None Specimens: None Implants: Implant Name Type Inv. Item Serial No. Manufacturer Lot No. LRB No. Used Action  BASEPLATE GLENOID RSA [id number] 0D - [id number] Shoulder BASEPLATE GLENOID RSA 3Z85 0D [id number] [employer]  Right 1 Implanted  SCREW BONE INTRNL SM 7 - [id number] Screw SCREW BONE INTRNL SM 7 [id number]  [employer]  Right 1 Implanted  SCREW PERIPHERAL 5.0X34 - [id number] Screw SCREW PERIPHERAL 5.0X34  [employer]  Right 2 Implanted  SCREW 5.5X26 - [id number] Screw SCREW 5.5X26  [employer]  Right 2 Implanted  GLENOSPHERE STANDARD 39 - [id number] Joint GLENOSPHERE STANDARD 39 [id number] [employer]  Right 1 Implanted  IMPLANT REVERSE SHOULDER 0X3.5 - [id number] Shoulder IMPLANT REVERSE SHOULDER 0X3.5 [id number] [employer]  Right 1 Implanted  STEM HUMERAL SZ2B STND 70 PTC - [id number] Stem STEM HUMERAL SZ2B STND 70 PTC [id number] [employer]  Right 1 Implanted  INSERT REV KIT SHOULDER 6X39 - [id number] Screw INSERT REV KIT SHOULDER 6X39 [id number] [employer]  Right 1 Implanted    Indications for Surgery:   Gabriel Anderson is a 49 y.o. male with previous cuff repairs and end-stage cuff tear arthropathy in a young active patient.  Benefits and risks of operative and nonoperative management were discussed prior to surgery with patient/guardian(s) and informed consent form was completed.  Infection and need for further surgery were discussed as was prosthetic stability and cuff issues.  We additionally specifically discussed risks of axillary nerve injury, infection, periprosthetic fracture, continued pain and longevity of implants prior to beginning procedure.      Procedure:   The patient was identified in the preoperative holding area where the surgical site was marked. Block placed by anesthesia with exparel.  The patient was taken to the OR where a procedural timeout was called and the above noted anesthesia was induced.  The patient was positioned beachchair on allen table with spider arm positioner.  Preoperative antibiotics  were dosed.  The patient's right shoulder was prepped and draped in the usual sterile fashion.  A second preoperative timeout was called.       Standard deltopectoral approach was performed with a #10 blade. We dissected down to the subcutaneous tissues and the cephalic  vein was taken laterally with the deltoid. Clavipectoral fascia was incised in line with the incision. Deep retractors were placed. The long of the biceps tendon was identified and there was significant tenosynovitis present.  Tenodesis was performed to the pectoralis tendon with #2 Ethibond. The remaining biceps was followed up into the rotator interval where it was released.   The subscapularis was taken down in a full thickness layer with capsule along the humeral neck extending inferiorly around the humeral head. We continued releasing the capsule directly off of the osteophytes inferiorly all the way around the corner. This allowed [place] to dislocate the humeral head.   The humeral head had evidence of severe osteoarthritic wear with full-thickness cartilage loss and exposed subchondral bone. There was significant flattening of the humeral head.   The rotator cuff was carefully examined and noted to be irreperably torn.  The decision was confirmed that a reverse total shoulder was indicated for this patient.  There were osteophytes along the inferior humeral neck. The osteophytes were removed with an osteotome and a rongeur.  Osteophytes were removed with a rongeur and an osteotome and the anatomic neck was well visualized.     A humeral cutting guide was inserted down the intramedullary canal. The version was set at 20 of retroversion. Humeral osteotomy was performed with an oscillating saw. The head fragment was passed off the back table. A starter awl was used to open the humeral canal. We next used T-handle straight sound reamers to ream up to an appropriate fit. A chisel was used to remove proximal humeral bone. We then broached starting with a size one broach and broaching up to 2 which obtained an appropriate fit. The broach handle was removed. A cut protector was placed. The broach handle was removed and a cut protector was placed. The humerus was retracted posteriorly and we turned our  attention to glenoid exposure.  The subscapularis was again identified and immediately we took care to palpate the axillary nerve anteriorly and verify its position with gentle palpation as well as the tug test.  We then released the SGHL with bovie cautery prior to placing a curved mayo at the junction of the anterior glenoid well above the axillary nerve and bluntly dissecting the subscapularis from the capsule.  We then carefully protected the axillary nerve as we gently released the inferior capsule to fully mobilize the subscapularis.  An anterior deltoid retractor was then placed as well as a small Hohmann retractor superiorly.   The glenoid was inspected and had evidence of severe osteoarthritic wear with full-thickness cartilage loss and exposed subchondral bone. The remaining labrum was removed circumferentially taking great care not to disrupt the posterior capsule.   The glenoid drill guide was placed and used to drill a guide pin in the center, inferior position. The glenoid face was then reamed concentrically over the guide wire. The center hole was drilled over the guidepin in a near anatomic angle of version. Next the glenoid vault was drilled back to a depth of 35 mm.  We tapped and then placed a 43mm size baseplate with 74mm lateralization was selected.  We attempted to use a 35 mm center screw however due to the  patient's sclerotic bone we felt that her drill bit may have exited more posterior than typical and we did not get good purchase and the vault was perforated posteriorly.  Based on this we felt that redirecting with a central ingrowth post was a good idea especially in light of the patient's young age.  We then fixed a 7 mm ingrowth post and we drilled redirecting to accommodate this.  We had great fit with her central post.  We placed 4 peripheral screws getting good purchase with all of them including the nonlocking anterior and posterior screws.   Next a 39  mm glenosphere was  selected and impacted onto the baseplate. The center screw was tightened.  We turned attention back to the humeral side. The cut protector was removed. We trialed with multiple size tray and polyethylene options and selected a 6 which provided good stability and range of motion without excess soft tissue tension. The offset was dialed in to match the normal anatomy. The shoulder was trialed.  There was good ROM in all planes and the shoulder was stable with no inferior translation.  The real humeral implants were opened after again confirming sizes.  The trial was removed. #5 Fiberwire x4 sutures passed through the humeral neck for subscap repair. The humeral component was press-fit obtaining a secure fit. A +0 high offset tray was selected and impacted onto the stem.  A 39+6 polyethylene liner was impacted onto the stem.  The joint was reduced and thoroughly irrigated with pulsatile lavage. Subscap was repaired back with #5 Fiberwire sutures through bone tunnels. Hemostasis was obtained. The deltopectoral interval was reapproximated with #1 Ethibond. The subcutaneous tissues were closed with 2-0 Vicryl and the skin was closed with running monocryl.    The wounds were cleaned and dried and an Aquacel dressing was placed. The drapes taken down. The arm was placed into sling with abduction pillow. Patient was awakened, extubated, and transferred to the recovery room in stable condition. There were no intraoperative complications. The sponge, needle, and attention counts were  correct at the end of the case.        Noemi Chapel, PA-C, present and scrubbed throughout the case, critical for completion in a timely fashion, and for retraction, instrumentation, closure.

## 2020-05-23 NOTE — Anesthesia Postprocedure Evaluation (Signed)
Anesthesia Post Note  Patient: CROSLEY STEJSKAL  Procedure(s) Performed: REVERSE SHOULDER ARTHROPLASTY (Right Shoulder)     Patient location during evaluation: PACU Anesthesia Type: Regional and General Level of consciousness: awake and alert, oriented and patient cooperative Pain management: pain level controlled Vital Signs Assessment: post-procedure vital signs reviewed and stable Respiratory status: spontaneous breathing, nonlabored ventilation and respiratory function stable Cardiovascular status: blood pressure returned to baseline and stable Postop Assessment: no apparent nausea or vomiting Anesthetic complications: no   No complications documented.  Last Vitals:  Vitals:   05/23/20 1300 05/23/20 1314  BP: 133/80 116/72  Pulse: 63   Resp: 15 15  Temp: 36.8 C 36.8 C  SpO2: 94% 96%    Last Pain:  Vitals:   05/23/20 1314  TempSrc:   PainSc: 3                  Lannie Fields

## 2020-05-23 NOTE — Interval H&P Note (Signed)
History and Physical Interval Note:  05/23/2020 8:16 AM  Gabriel Anderson  has presented today for surgery, with the diagnosis of right djd shoulder.  The various methods of treatment have been discussed with the patient and family. After consideration of risks, benefits and other options for treatment, the patient has consented to  Procedure(s): REVERSE SHOULDER ARTHROPLASTY (Right) as a surgical intervention.  The patient's history has been reviewed, patient examined, no change in status, stable for surgery.  I have reviewed the patient's chart and labs.  Questions were answered to the patient's satisfaction.     Bjorn Pippin

## 2020-05-25 ENCOUNTER — Encounter (HOSPITAL_COMMUNITY): Payer: Self-pay | Admitting: Orthopaedic Surgery

## 2020-07-10 ENCOUNTER — Telehealth: Payer: Self-pay

## 2020-07-10 NOTE — Telephone Encounter (Signed)
LVM reminder to complete the cologuard kit that he received in the mail for colon cancer screening, please call the office if questions. Will also send myChart message.

## 2020-08-16 ENCOUNTER — Other Ambulatory Visit: Payer: Self-pay | Admitting: Internal Medicine

## 2020-08-16 DIAGNOSIS — E559 Vitamin D deficiency, unspecified: Secondary | ICD-10-CM

## 2020-09-03 LAB — COLOGUARD
Cologuard: NEGATIVE
Cologuard: NEGATIVE

## 2020-12-14 ENCOUNTER — Ambulatory Visit (HOSPITAL_COMMUNITY): Payer: No Typology Code available for payment source | Attending: Cardiology

## 2020-12-14 ENCOUNTER — Other Ambulatory Visit: Payer: Self-pay

## 2020-12-14 ENCOUNTER — Ambulatory Visit (INDEPENDENT_AMBULATORY_CARE_PROVIDER_SITE_OTHER): Payer: No Typology Code available for payment source | Admitting: Cardiology

## 2020-12-14 ENCOUNTER — Encounter: Payer: Self-pay | Admitting: Cardiology

## 2020-12-14 VITALS — BP 110/70 | HR 75 | Ht 70.0 in | Wt 252.0 lb

## 2020-12-14 DIAGNOSIS — I350 Nonrheumatic aortic (valve) stenosis: Secondary | ICD-10-CM | POA: Diagnosis not present

## 2020-12-14 DIAGNOSIS — I2583 Coronary atherosclerosis due to lipid rich plaque: Secondary | ICD-10-CM

## 2020-12-14 DIAGNOSIS — Z01812 Encounter for preprocedural laboratory examination: Secondary | ICD-10-CM | POA: Diagnosis not present

## 2020-12-14 DIAGNOSIS — I2510 Atherosclerotic heart disease of native coronary artery without angina pectoris: Secondary | ICD-10-CM

## 2020-12-14 LAB — ECHOCARDIOGRAM COMPLETE
AR max vel: 1.05 cm2
AV Area VTI: 1.06 cm2
AV Area mean vel: 1.07 cm2
AV Mean grad: 35.3 mmHg
AV Peak grad: 60 mmHg
Ao pk vel: 3.87 m/s
Area-P 1/2: 2.8 cm2
P 1/2 time: 376 msec
S' Lateral: 3.3 cm

## 2020-12-14 NOTE — Progress Notes (Signed)
Cardiology Office Note:    Date:  12/14/2020   ID:  Gabriel Anderson, DOB October 19, 1970, MRN [id number]  PCP:  [name], MD  [facility] Cardiologist:  Donato Schultz, MD  [facility] Electrophysiologist:  None   Referring MD: [name], MD     History of Present Illness:    Gabriel Anderson is a 50 y.o. male here for the follow-up of aortic stenosis and coronary artery disease.  Wife name is Gabriel Anderson. [place] 68 year old son, Gabriel Anderson [facility] - [facility] [employer].  Miles 15 - shed pounds stopping soda.   CAD post proximal LAD stent x2 in 2013.  Subsequent catheterization after elevated troponin showed patent stents but large diagonal branch that was jailed.  Isosorbide at that time, at one time tried Ranexa but cost was an issue.  Overall doing well currently.  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  Stated prior visit he significantly decreased his alcohol intake.  Now he is curbing his alcohol significantly.  He is experiencing shortness of breath with activity however, walking from parked car to the [place] he had to stop 3 times.  This is unusual for him.  With his aortic stenosis we will go ahead and check a right and left heart cath.   Past Medical History:  Diagnosis Date  . Acute myocardial infarction, subendocardial infarction, initial episode of care [facility]) 2013   Cardiac catheterization 06/02/12-widely patent previously placed LAD stents. Troponin peaked 3. Normal EF.  [name] Anemia   . Arthritis   . Coronary atherosclerosis of native coronary artery 05/21/2012   Cardiac catheterization 05/21/12-95% mid LAD lesion at the bifurcation of a large diagonal branch. Normal ejection fraction.  [name] Heart murmur   . Hyperlipidemia   . Hypertension   . Pneumonia   . Raynaud's disease   . Vasospasm (HCC)    takes imdur for    Past Surgical History:  Procedure Laterality Date  .  cardiacstent x 2   2013  . CARDIAC CATHETERIZATION  05/2012   PCI LAD at takeoff of large diagonal  . ELBOW SURGERY Left 1984  . LEFT HEART CATHETERIZATION WITH CORONARY ANGIOGRAM N/A 05/21/2012   Procedure: LEFT HEART CATHETERIZATION WITH CORONARY ANGIOGRAM;  Surgeon: Corky Crafts, MD;  Location: [facility] CATH LAB;  Service: Cardiovascular;  Laterality: N/A;  . LEFT HEART CATHETERIZATION WITH CORONARY ANGIOGRAM N/A 06/02/2012   Procedure: LEFT HEART CATHETERIZATION WITH CORONARY ANGIOGRAM;  Surgeon: Donato Schultz, MD;  Location: [facility] CATH LAB;  Service: Cardiovascular;  Laterality: N/A;  . PERCUTANEOUS CORONARY STENT INTERVENTION (PCI-S)  05/21/2012   Procedure: PERCUTANEOUS CORONARY STENT INTERVENTION (PCI-S);  Surgeon: Corky Crafts, MD;  Location: [facility] CATH LAB;  Service: Cardiovascular;;  . REVERSE SHOULDER ARTHROPLASTY Right 05/23/2020   Procedure: REVERSE SHOULDER ARTHROPLASTY;  Surgeon: Bjorn Pippin, MD;  Location: [facility] ORS;  Service: Orthopedics;  Laterality: Right;  . ROTATOR CUFF REPAIR Right 2009  . TOTAL HIP ARTHROPLASTY Left 12/30/2013   Procedure: LEFT TOTAL HIP ARTHROPLASTY ANTERIOR APPROACH;  Surgeon: Kathryne Hitch, MD;  Location: [facility] ORS;  Service: Orthopedics;  Laterality: Left;    Current Medications: Current Meds  Medication Sig  . amoxicillin (AMOXIL) 500 MG tablet Take 2,000 mg by mouth See admin instructions. Dental procedures  . aspirin 81 MG tablet Take 1 tablet (81 mg total) by mouth daily.  . candesartan (ATACAND) 32 MG tablet  Take 1 tablet (32 mg total) by mouth daily.  . Cholecalciferol (VITAMIN D3) 50 MCG (2000 UT) TABS TAKE 1 TABLET BY MOUTH EVERY DAY (Patient taking differently: Take 2,000 Units by mouth daily.)  . nitroGLYCERIN (NITROSTAT) 0.4 MG SL tablet Place 1 tablet (0.4 mg total) under the tongue every 5 (five) minutes as needed. (Patient not taking: Reported on 12/14/2020)  . rosuvastatin (CRESTOR) 40 MG tablet Take 1 tablet (40 mg total) by mouth daily.  [name]. scopolamine  (TRANSDERM-SCOP) 1 MG/3DAYS Place 1 patch (1.5 mg total) onto the skin every 3 (three) days. (Patient not taking: Reported on 12/14/2020)     Allergies:   Patient has no known allergies.   Social History   Socioeconomic History  . Marital status: Married    Spouse name: Not on file  . Number of children: Not on file  . Years of education: Not on file  . Highest education level: Not on file  Occupational History  . Not on file  Tobacco Use  . Smoking status: Never Smoker  . Smokeless tobacco: Never Used  Vaping Use  . Vaping Use: Never used  Substance and Sexual Activity  . Alcohol use: Yes    Alcohol/week: 34.0 standard drinks    Types: 28 Cans of beer, 2 Shots of liquor, 4 Standard drinks or equivalent per week    Comment: moderate  . Drug use: Yes    Types: Marijuana    Comment: marijuana 0.05 grams smoked  per day for joint pain  . Sexual activity: Yes    Partners: Female  Other Topics Concern  . Not on file  Social History Narrative  . Not on file   Social Determinants of Health   Financial Resource Strain: Not on file  Food Insecurity: Not on file  Transportation Needs: Not on file  Physical Activity: Not on file  Stress: Not on file  Social Connections: Not on file     Family History: The patient's family history includes COPD in his mother; Diabetes in his father; Heart disease in his father; Heart failure in his father; Hypertension in his father; Stroke in his sister.  ROS:   Please see the history of present illness.     All other systems reviewed and are negative.  EKGs/Labs/Other Studies Reviewed:    The following studies were reviewed today: Echocardiograms reviewed MRI shoulder: 1. Complete full-thickness tears of the supraspinatus and infraspinatus tendons with retraction to the level of the glenohumeral joint. 2. Severe tendinosis with near-complete tear of the subscapularis tendon. 3. Medial dislocation of the long head of the biceps tendon  from the bicipital groove. Tendon is severely tendinotic. 4. Surgical anchors within the greater tuberosity from prior rotator cuff repair with subcortical cystic change, edema, and possible erosion. No appreciable fracture line. 5. Mild-to-moderate glenohumeral and acromioclavicular joint osteoarthritis.  12/14/20:ECHO  1. Left ventricular ejection fraction, by estimation, is 50 to 55%. The  left ventricle has low normal function. The left ventricle has no regional  wall motion abnormalities. There is mild left ventricular hypertrophy.  Left ventricular diastolic  parameters are consistent with Grade I diastolic dysfunction (impaired  relaxation). Elevated left atrial pressure.  2. Right ventricular systolic function is normal. The right ventricular  size is normal.  3. The mitral valve is normal in structure. Trivial mitral valve  regurgitation. No evidence of mitral stenosis.  4. The aortic valve has an indeterminant number of cusps. Aortic valve  regurgitation is mild. Moderate to severe aortic  valve stenosis. 5. Aortic dilatation noted. There is mild dilatation of the ascending  aorta, measuring 40 mm.  6. The inferior vena cava is normal in size with greater than 50%  respiratory variability, suggesting right atrial pressure of 3 mmHg.   EKG: Sinus rhythm 65 with nonspecific ST-T wave changes  Recent Labs: 03/27/2020: ALT 26; TSH 2.06 05/16/2020: BUN 15; Creatinine, Ser 0.82; Hemoglobin 13.7; Platelets 270; Potassium 4.1; Sodium 135  Recent Lipid Panel    Component Value Date/Time   CHOL 109 03/27/2020 1111   CHOL 151 10/26/2018 0959   TRIG 76 03/27/2020 1111   HDL 42 03/27/2020 1111   HDL 44 10/26/2018 0959   CHOLHDL 2.6 03/27/2020 1111   VLDL 16.0 02/12/2015 0909   LDLCALC 51 03/27/2020 1111   LDLDIRECT 150.0 09/19/2013 1050    Physical Exam:    VS:  BP 110/70 (BP Location: Left Arm, Patient Position: Sitting, Cuff Size: Normal)   Pulse 75   Ht 5\' 10"   (1.778 m)   Wt 252 lb (114.3 kg)   SpO2 96%   BMI 36.16 kg/m     Wt Readings from Last 3 Encounters:  12/14/20 252 lb (114.3 kg)  05/16/20 240 lb (108.9 kg)  03/27/20 248 lb (112.5 kg)     GEN:  Well nourished, well developed in no acute distress HEENT: Normal NECK: No JVD; No carotid bruits LYMPHATICS: No lymphadenopathy CARDIAC: RRR,3/6 SM, no rubs, gallops RESPIRATORY:  Clear to auscultation without rales, wheezing or rhonchi  ABDOMEN: Soft, non-tender, non-distended MUSCULOSKELETAL:  No edema; No deformity  SKIN: Warm and dry NEUROLOGIC:  Alert and oriented x 3 PSYCHIATRIC:  Normal affect   ASSESSMENT:    1. Pre-procedure lab exam   2. Coronary artery disease due to lipid rich plaque   3. Nonrheumatic aortic valve stenosis    PLAN:    In order of problems listed above:  Moderate to severe aortic stenosis -Most recent echocardiogram shows mean gradient of 03/29/20, prior mean gradient of 34 mmHg, peak velocity of 3.7 m/s. -Mild increase from prior echocardiogram.  6 month follow up.   He is having shortness of breath with activity, walking, landscaping. Having to stop. No chest pain or syncope. I will check a R and L heart cath to further evaluate valve and coronary anatomy.   Coronary artery disease with hyperlipidemia -Prior PCI of LAD in 2013 at large diagonal takeoff.  Stable.  No anginal symptoms. Doing well.   Hyperlipidemia -LDL goal less than 70.  At last check 90 on 10/26/2018.  ALT was 25 creatinine 0.9 hemoglobin 15. -He has been taking atorvastatin 80 mg once a day.  We will go ahead and change to Crestor 40 mg a day and recheck in 3 months.  We could add Zetia 10 mg a day if necessary.  He is working on diet and exercise as well.h  Shoulder tear --Shoulder replacement Dr. 10/28/2018, [place] with [facility]  6 month follow up   Medication Adjustments/Labs and Tests Ordered: Current medicines are reviewed at length with the patient today.  Concerns regarding  medicines are outlined above.  Orders Placed This Encounter  Procedures  . Basic metabolic panel  . CBC  . EKG 12-Lead   No orders of the defined types were placed in this encounter.   Patient Instructions  Medication Instructions:  The current medical regimen is effective;  continue present plan and medications.  *If you need a refill on your cardiac medications before your  next appointment, please call your pharmacy*  Lab Work: Please have blood work today (CBC, BMP)  If you have labs (blood work) drawn today and your tests are completely normal, you will receive your results only by: [name] MyChart Message (if you have MyChart) OR . A paper copy in the mail If you have any lab test that is abnormal or we need to change your treatment, we will call you to review the results.   Testing/Procedures:   [facility] CARDIOVASCULAR DIVISION [facility] ST OFFICE [address] [place] [address] Dept: [phone] Loc: [phone]  Gabriel Anderson  12/14/2020  You are scheduled for a cardiac cath on Wednesday December 19, 2020 with Dr. Herbie Baltimore.  1. Please arrive at the [facility] (Main Entrance A) at [facility]: [address] [place], [place] [address] at 6:30 am  (two hours before your procedure to ensure your preparation). Free valet parking service is available.   Special note: Every effort is made to have your procedure done on time. Please understand that emergencies sometimes delay scheduled procedures.  2. Diet: Nothing after midnight the night before except medications with sips of water.  3. Labs: Today (CBC, BMP)  Covid Screening as scheduled - [address] [place], [place], [place].  Drive through, follow the cones under the portico, stay in vehicle and someone will come to you.  4. Medication instructions in preparation for your procedure:   On the morning of your procedure, take your Aspirin and any  morning medicines NOT listed above.  You may use sips of water.  5. Plan for one night stay--bring personal belongings. 6. Bring a current list of your medications and current insurance cards. 7. You MUST have a responsible person to drive you home. 8. Someone MUST be with you the first 24 hours after you arrive home or your discharge will be delayed. 9. Please wear clothes that are easy to get on and off and wear slip-on shoes.  Thank you for allowing [place] to care for you!   -- [place] [facility]  Follow-Up: At [facility], you and your health needs are our priority.  As part of our continuing mission to provide you with exceptional heart care, we have created designated Provider Care Teams.  These Care Teams include your primary Cardiologist (physician) and Advanced Practice Providers (APPs -  Physician Assistants and Nurse Practitioners) who all work together to provide you with the care you need, when you need it.  We recommend signing up for the patient portal called "MyChart".  Sign up information is provided on this After Visit Summary.  MyChart is used to connect with patients for Virtual Visits (Telemedicine).  Patients are able to view lab/test results, encounter notes, upcoming appointments, etc.  Non-urgent messages can be sent to your provider as well.   To learn more about what you can do with MyChart, go to [email or web address].    Your next appointment:   6 month(s)  The format for your next appointment:   In Person  Provider:   Donato Schultz, MD   Thank you for choosing [facility]!!         Signed, Donato Schultz, MD  12/14/2020 5:12 PM    [facility]

## 2020-12-14 NOTE — Patient Instructions (Signed)
Medication Instructions:  The current medical regimen is effective;  continue present plan and medications.  *If you need a refill on your cardiac medications before your next appointment, please call your pharmacy*  Lab Work: Please have blood work today (CBC, BMP)  If you have labs (blood work) drawn today and your tests are completely normal, you will receive your results only by: [name] MyChart Message (if you have MyChart) OR . A paper copy in the mail If you have any lab test that is abnormal or we need to change your treatment, we will call you to review the results.   Testing/Procedures:   [facility] CARDIOVASCULAR DIVISION [facility] ST OFFICE [address] [place] [address] Dept: [phone] Loc: [phone]  Gabriel Anderson  12/14/2020  You are scheduled for a cardiac cath on Wednesday December 19, 2020 with Dr. Herbie Baltimore.  1. Please arrive at the [facility] (Main Entrance A) at [facility]: [address] [place], [place] [address] at 6:30 am  (two hours before your procedure to ensure your preparation). Free valet parking service is available.   Special note: Every effort is made to have your procedure done on time. Please understand that emergencies sometimes delay scheduled procedures.  2. Diet: Nothing after midnight the night before except medications with sips of water.  3. Labs: Today (CBC, BMP)  Covid Screening as scheduled - [address] [place], [place], [place].  Drive through, follow the cones under the portico, stay in vehicle and someone will come to you.  4. Medication instructions in preparation for your procedure:   On the morning of your procedure, take your Aspirin and any morning medicines NOT listed above.  You may use sips of water.  5. Plan for one night stay--bring personal belongings. 6. Bring a current list of your medications and current insurance cards. 7. You MUST have a  responsible person to drive you home. 8. Someone MUST be with you the first 24 hours after you arrive home or your discharge will be delayed. 9. Please wear clothes that are easy to get on and off and wear slip-on shoes.  Thank you for allowing [place] to care for you!   -- [place] [facility] services  Follow-Up: At [facility], you and your health needs are our priority.  As part of our continuing mission to provide you with exceptional heart care, we have created designated Provider Care Teams.  These Care Teams include your primary Cardiologist (physician) and Advanced Practice Providers (APPs -  Physician Assistants and Nurse Practitioners) who all work together to provide you with the care you need, when you need it.  We recommend signing up for the patient portal called "MyChart".  Sign up information is provided on this After Visit Summary.  MyChart is used to connect with patients for Virtual Visits (Telemedicine).  Patients are able to view lab/test results, encounter notes, upcoming appointments, etc.  Non-urgent messages can be sent to your provider as well.   To learn more about what you can do with MyChart, go to [email or web address].    Your next appointment:   6 month(s)  The format for your next appointment:   In Person  Provider:   Donato Schultz, MD   Thank you for choosing [facility]!!

## 2020-12-14 NOTE — H&P (View-Only) (Signed)
Cardiology Office Note:    Date:  12/14/2020   ID:  DAIJON WENKE, DOB October 19, 1970, MRN [id number]  PCP:  [name], MD  [facility] Cardiologist:  Donato Schultz, MD  [facility] Electrophysiologist:  None   Referring MD: [name], MD     History of Present Illness:    Gabriel Anderson is a 50 y.o. male here for the follow-up of aortic stenosis and coronary artery disease.  Wife name is [name]. [place] 68 year old son, [place] [facility] - [facility].  Miles 15 - shed pounds stopping soda.   CAD post proximal LAD stent x2 in 2013.  Subsequent catheterization after elevated troponin showed patent stents but large diagonal branch that was jailed.  Isosorbide at that time, at one time tried Ranexa but cost was an issue.  Overall doing well currently.  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  Stated prior visit he significantly decreased his alcohol intake.  Now he is curbing his alcohol significantly.  He is experiencing shortness of breath with activity however, walking from parked car [place] the [place] he had to stop 3 times.  This is unusual for him.  With his aortic stenosis we will go ahead and check a right and left heart cath.   Past Medical History:  Diagnosis Date  . Acute myocardial infarction, subendocardial infarction, initial episode of care [facility]) 2013   Cardiac catheterization 06/02/12-widely patent previously placed LAD stents. Troponin peaked 3. Normal EF.  [name] Anemia   . Arthritis   . Coronary atherosclerosis of native coronary artery 05/21/2012   Cardiac catheterization 05/21/12-95% mid LAD lesion at the bifurcation of a large diagonal branch. Normal ejection fraction.  [name] Heart murmur   . Hyperlipidemia   . Hypertension   . Pneumonia   . Raynaud's disease   . Vasospasm (HCC)    takes imdur for    Past Surgical History:  Procedure Laterality Date  .  cardiacstent x 2   2013  . CARDIAC CATHETERIZATION  05/2012   PCI LAD at takeoff of large diagonal  . ELBOW SURGERY Left 1984  . LEFT HEART CATHETERIZATION WITH CORONARY ANGIOGRAM N/A 05/21/2012   Procedure: LEFT HEART CATHETERIZATION WITH CORONARY ANGIOGRAM;  Surgeon: Corky Crafts, MD;  Location: [facility] CATH LAB;  Service: Cardiovascular;  Laterality: N/A;  . LEFT HEART CATHETERIZATION WITH CORONARY ANGIOGRAM N/A 06/02/2012   Procedure: LEFT HEART CATHETERIZATION WITH CORONARY ANGIOGRAM;  Surgeon: Donato Schultz, MD;  Location: [facility] CATH LAB;  Service: Cardiovascular;  Laterality: N/A;  . PERCUTANEOUS CORONARY STENT INTERVENTION (PCI-S)  05/21/2012   Procedure: PERCUTANEOUS CORONARY STENT INTERVENTION (PCI-S);  Surgeon: Corky Crafts, MD;  Location: [facility] CATH LAB;  Service: Cardiovascular;;  . REVERSE SHOULDER ARTHROPLASTY Right 05/23/2020   Procedure: REVERSE SHOULDER ARTHROPLASTY;  Surgeon: Bjorn Pippin, MD;  Location: [facility] ORS;  Service: Orthopedics;  Laterality: Right;  . ROTATOR CUFF REPAIR Right 2009  . TOTAL HIP ARTHROPLASTY Left 12/30/2013   Procedure: LEFT TOTAL HIP ARTHROPLASTY ANTERIOR APPROACH;  Surgeon: Kathryne Hitch, MD;  Location: [facility] ORS;  Service: Orthopedics;  Laterality: Left;    Current Medications: Current Meds  Medication Sig  . amoxicillin (AMOXIL) 500 MG tablet Take 2,000 mg by mouth See admin instructions. Dental procedures  . aspirin 81 MG tablet Take 1 tablet (81 mg total) by mouth daily.  . candesartan (ATACAND) 32 MG tablet  Take 1 tablet (32 mg total) by mouth daily.  . Cholecalciferol (VITAMIN D3) 50 MCG (2000 UT) TABS TAKE 1 TABLET BY MOUTH EVERY DAY (Patient taking differently: Take 2,000 Units by mouth daily.)  . nitroGLYCERIN (NITROSTAT) 0.4 MG SL tablet Place 1 tablet (0.4 mg total) under the tongue every 5 (five) minutes as needed. (Patient not taking: Reported on 12/14/2020)  . rosuvastatin (CRESTOR) 40 MG tablet Take 1 tablet (40 mg total) by mouth daily.  . scopolamine  (TRANSDERM-SCOP) 1 MG/3DAYS Place 1 patch (1.5 mg total) onto the skin every 3 (three) days. (Patient not taking: Reported on 12/14/2020)     Allergies:   Patient has no known allergies.   Social History   Socioeconomic History  . Marital status: Married    Spouse name: Not on file  . Number of children: Not on file  . Years of education: Not on file  . Highest education level: Not on file  Occupational History  . Not on file  Tobacco Use  . Smoking status: Never Smoker  . Smokeless tobacco: Never Used  Vaping Use  . Vaping Use: Never used  Substance and Sexual Activity  . Alcohol use: Yes    Alcohol/week: 34.0 standard drinks    Types: 28 Cans of beer, 2 Shots of liquor, 4 Standard drinks or equivalent per week    Comment: moderate  . Drug use: Yes    Types: Marijuana    Comment: marijuana 0.05 grams smoked  per day for joint pain  . Sexual activity: Yes    Partners: Female  Other Topics Concern  . Not on file  Social History Narrative  . Not on file   Social Determinants of Health   Financial Resource Strain: Not on file  Food Insecurity: Not on file  Transportation Needs: Not on file  Physical Activity: Not on file  Stress: Not on file  Social Connections: Not on file     Family History: The patient's family history includes COPD in his mother; Diabetes in his father; Heart disease in his father; Heart failure in his father; Hypertension in his father; Stroke in his sister.  ROS:   Please see the history of present illness.     All other systems reviewed and are negative.  EKGs/Labs/Other Studies Reviewed:    The following studies were reviewed today: Echocardiograms reviewed MRI shoulder: 1. Complete full-thickness tears of the supraspinatus and infraspinatus tendons with retraction to the level of the glenohumeral joint. 2. Severe tendinosis with near-complete tear of the subscapularis tendon. 3. Medial dislocation of the long head of the biceps tendon  from the bicipital groove. Tendon is severely tendinotic. 4. Surgical anchors within the greater tuberosity from prior rotator cuff repair with subcortical cystic change, edema, and possible erosion. No appreciable fracture line. 5. Mild-to-moderate glenohumeral and acromioclavicular joint osteoarthritis.  12/14/20:ECHO  1. Left ventricular ejection fraction, by estimation, is 50 to 55%. The  left ventricle has low normal function. The left ventricle has no regional  wall motion abnormalities. There is mild left ventricular hypertrophy.  Left ventricular diastolic  parameters are consistent with Grade I diastolic dysfunction (impaired  relaxation). Elevated left atrial pressure.  2. Right ventricular systolic function is normal. The right ventricular  size is normal.  3. The mitral valve is normal in structure. Trivial mitral valve  regurgitation. No evidence of mitral stenosis.  4. The aortic valve has an indeterminant number of cusps. Aortic valve  regurgitation is mild. Moderate to severe aortic   valve stenosis. 5. Aortic dilatation noted. There is mild dilatation of the ascending  aorta, measuring 40 mm.  6. The inferior vena cava is normal in size with greater than 50%  respiratory variability, suggesting right atrial pressure of 3 mmHg.   EKG: Sinus rhythm 65 with nonspecific ST-T wave changes  Recent Labs: 03/27/2020: ALT 26; TSH 2.06 05/16/2020: BUN 15; Creatinine, Ser 0.82; Hemoglobin 13.7; Platelets 270; Potassium 4.1; Sodium 135  Recent Lipid Panel    Component Value Date/Time   CHOL 109 03/27/2020 1111   CHOL 151 10/26/2018 0959   TRIG 76 03/27/2020 1111   HDL 42 03/27/2020 1111   HDL 44 10/26/2018 0959   CHOLHDL 2.6 03/27/2020 1111   VLDL 16.0 02/12/2015 0909   LDLCALC 51 03/27/2020 1111   LDLDIRECT 150.0 09/19/2013 1050    Physical Exam:    VS:  BP 110/70 (BP Location: Left Arm, Patient Position: Sitting, Cuff Size: Normal)   Pulse 75   Ht 5\' 10"   (1.778 m)   Wt 252 lb (114.3 kg)   SpO2 96%   BMI 36.16 kg/m     Wt Readings from Last 3 Encounters:  12/14/20 252 lb (114.3 kg)  05/16/20 240 lb (108.9 kg)  03/27/20 248 lb (112.5 kg)     GEN:  Well nourished, well developed in no acute distress HEENT: Normal NECK: No JVD; No carotid bruits LYMPHATICS: No lymphadenopathy CARDIAC: RRR,3/6 SM, no rubs, gallops RESPIRATORY:  Clear to auscultation without rales, wheezing or rhonchi  ABDOMEN: Soft, non-tender, non-distended MUSCULOSKELETAL:  No edema; No deformity  SKIN: Warm and dry NEUROLOGIC:  Alert and oriented x 3 PSYCHIATRIC:  Normal affect   ASSESSMENT:    1. Pre-procedure lab exam   2. Coronary artery disease due to lipid rich plaque   3. Nonrheumatic aortic valve stenosis    PLAN:    In order of problems listed above:  Moderate to severe aortic stenosis -Most recent echocardiogram shows mean gradient of 03/29/20, prior mean gradient of 34 mmHg, peak velocity of 3.7 m/s. -Mild increase from prior echocardiogram.  6 month follow up.   He is having shortness of breath with activity, walking, landscaping. Having to stop. No chest pain or syncope. I will check a R and L heart cath to further evaluate valve and coronary anatomy.   Coronary artery disease with hyperlipidemia -Prior PCI of LAD in 2013 at large diagonal takeoff.  Stable.  No anginal symptoms. Doing well.   Hyperlipidemia -LDL goal less than 70.  At last check 90 on 10/26/2018.  ALT was 25 creatinine 0.9 hemoglobin 15. -He has been taking atorvastatin 80 mg once a day.  We will go ahead and change to Crestor 40 mg a day and recheck in 3 months.  We could add Zetia 10 mg a day if necessary.  He is working on diet and exercise as well.h  Shoulder tear --Shoulder replacement Dr. 10/28/2018, [facility] with [facility]  6 month follow up   Medication Adjustments/Labs and Tests Ordered: Current medicines are reviewed at length with the patient today.  Concerns regarding  medicines are outlined above.  Orders Placed This Encounter  Procedures  . Basic metabolic panel  . CBC  . EKG 12-Lead   No orders of the defined types were placed in this encounter.   Patient Instructions  Medication Instructions:  The current medical regimen is effective;  continue present plan and medications.  *If you need a refill on your cardiac medications before your  next appointment, please call your pharmacy*  Lab Work: Please have blood work today (CBC, BMP)  If you have labs (blood work) drawn today and your tests are completely normal, you will receive your results only by: [name] MyChart Message (if you have MyChart) OR . A paper copy in the mail If you have any lab test that is abnormal or we need to change your treatment, we will call you to review the results.   Testing/Procedures:   [facility] CARDIOVASCULAR DIVISION [facility] ST OFFICE [address] [place] [address] Dept: [phone] Loc: [phone]  CARLIS BLANCHARD  12/14/2020  You are scheduled for a cardiac cath on Wednesday December 19, 2020 with Dr. Herbie Baltimore.  1. Please arrive at the [facility] (Main Entrance A) at [facility]: [address] [place], [place] [address] at 6:30 am  (two hours before your procedure to ensure your preparation). Free valet parking service is available.   Special note: Every effort is made to have your procedure done on time. Please understand that emergencies sometimes delay scheduled procedures.  2. Diet: Nothing after midnight the night before except medications with sips of water.  3. Labs: Today (CBC, BMP)  Covid Screening as scheduled - [address] [place], [place], [place].  Drive through, follow the cones under the portico, stay in vehicle and someone will come to you.  4. Medication instructions in preparation for your procedure:   On the morning of your procedure, take your Aspirin and any  morning medicines NOT listed above.  You may use sips of water.  5. Plan for one night stay--bring personal belongings. 6. Bring a current list of your medications and current insurance cards. 7. You MUST have a responsible person to drive you home. 8. Someone MUST be with you the first 24 hours after you arrive home or your discharge will be delayed. 9. Please wear clothes that are easy to get on and off and wear slip-on shoes.  Thank you for allowing [place] to care for you!   -- [facility]  Follow-Up: At [facility], you and your health needs are our priority.  As part of our continuing mission to provide you with exceptional heart care, we have created designated Provider Care Teams.  These Care Teams include your primary Cardiologist (physician) and Advanced Practice Providers (APPs -  Physician Assistants and Nurse Practitioners) who all work together to provide you with the care you need, when you need it.  We recommend signing up for the patient portal called "MyChart".  Sign up information is provided on this After Visit Summary.  MyChart is used to connect with patients for Virtual Visits (Telemedicine).  Patients are able to view lab/test results, encounter notes, upcoming appointments, etc.  Non-urgent messages can be sent to your provider as well.   To learn more about what you can do with MyChart, go to [email or web address].    Your next appointment:   6 month(s)  The format for your next appointment:   In Person  Provider:   Donato Schultz, MD   Thank you for choosing [facility]!!         Signed, Donato Schultz, MD  12/14/2020 5:12 PM    [facility]

## 2020-12-15 LAB — BASIC METABOLIC PANEL
BUN/Creatinine Ratio: 12 (ref 9–20)
BUN: 12 mg/dL (ref 6–24)
CO2: 23 mmol/L (ref 20–29)
Calcium: 9.2 mg/dL (ref 8.7–10.2)
Chloride: 100 mmol/L (ref 96–106)
Creatinine, Ser: 0.97 mg/dL (ref 0.76–1.27)
Glucose: 95 mg/dL (ref 65–99)
Potassium: 4.3 mmol/L (ref 3.5–5.2)
Sodium: 138 mmol/L (ref 134–144)
eGFR: 94 mL/min/{1.73_m2} (ref >59)

## 2020-12-15 LAB — CBC
Hematocrit: 41.3 % (ref 37.5–51.0)
Hemoglobin: 13.6 g/dL (ref 13.0–17.7)
MCH: 28.9 pg (ref 26.6–33.0)
MCHC: 32.9 g/dL (ref 31.5–35.7)
MCV: 88 fL (ref 79–97)
Platelets: 355 10*3/uL (ref 150–450)
RBC: 4.71 x10E6/uL (ref 4.14–5.80)
RDW: 12.4 % (ref 11.6–15.4)
WBC: 9.6 10*3/uL (ref 3.4–10.8)

## 2020-12-17 ENCOUNTER — Other Ambulatory Visit (HOSPITAL_COMMUNITY): Payer: No Typology Code available for payment source

## 2020-12-18 ENCOUNTER — Telehealth: Payer: Self-pay | Admitting: *Deleted

## 2020-12-18 ENCOUNTER — Other Ambulatory Visit (HOSPITAL_COMMUNITY)
Admission: RE | Admit: 2020-12-18 | Discharge: 2020-12-18 | Disposition: A | Discharge status: Home / Self Care | Payer: No Typology Code available for payment source | Source: Ambulatory Visit | Attending: Cardiology | Admitting: Cardiology

## 2020-12-18 DIAGNOSIS — Z01812 Encounter for preprocedural laboratory examination: Secondary | ICD-10-CM | POA: Diagnosis present

## 2020-12-18 DIAGNOSIS — Z20822 Contact with and (suspected) exposure to covid-19: Secondary | ICD-10-CM | POA: Insufficient documentation

## 2020-12-18 LAB — SARS CORONAVIRUS 2 (TAT 6-24 HRS): SARS Coronavirus 2: NEGATIVE

## 2020-12-18 NOTE — Telephone Encounter (Signed)
Pt contacted pre-catheterization scheduled at [facility] for: Wednesday December 19, 2020 8:30 AM Verified arrival time and place: [facility] Main Entrance A [facility]) at: 6:30 AM   No solid food after midnight prior to cath, clear liquids until 5 AM day of procedure.   AM meds can be  taken pre-cath with sips of water including: ASA 81 mg    Confirmed patient has responsible adult to drive home post procedure and be with patient first 24 hours after arriving home: yes  You are allowed ONE visitor in the waiting room during the time you are at the hospital for your procedure. Both you and your visitor must wear a mask once you enter the hospital.   Reviewed procedure/mask/visitor instructions with patient.

## 2020-12-19 ENCOUNTER — Encounter (HOSPITAL_COMMUNITY): Payer: Self-pay | Admitting: Cardiology

## 2020-12-19 ENCOUNTER — Ambulatory Visit (HOSPITAL_COMMUNITY)
Admission: RE | Admit: 2020-12-19 | Discharge: 2020-12-19 | Disposition: A | Discharge status: Home / Self Care | Payer: No Typology Code available for payment source | Attending: Cardiology | Admitting: Cardiology

## 2020-12-19 ENCOUNTER — Encounter (HOSPITAL_COMMUNITY)
Admission: RE | Disposition: A | Discharge status: Home / Self Care | Payer: Self-pay | Source: Home / Self Care | Attending: Cardiology

## 2020-12-19 ENCOUNTER — Other Ambulatory Visit: Payer: Self-pay

## 2020-12-19 DIAGNOSIS — Z7982 Long term (current) use of aspirin: Secondary | ICD-10-CM | POA: Insufficient documentation

## 2020-12-19 DIAGNOSIS — Z9861 Coronary angioplasty status: Secondary | ICD-10-CM

## 2020-12-19 DIAGNOSIS — R0602 Shortness of breath: Secondary | ICD-10-CM | POA: Diagnosis not present

## 2020-12-19 DIAGNOSIS — E785 Hyperlipidemia, unspecified: Secondary | ICD-10-CM | POA: Diagnosis not present

## 2020-12-19 DIAGNOSIS — I350 Nonrheumatic aortic (valve) stenosis: Secondary | ICD-10-CM | POA: Insufficient documentation

## 2020-12-19 DIAGNOSIS — Z79899 Other long term (current) drug therapy: Secondary | ICD-10-CM | POA: Diagnosis not present

## 2020-12-19 DIAGNOSIS — R0600 Dyspnea, unspecified: Secondary | ICD-10-CM

## 2020-12-19 DIAGNOSIS — Z955 Presence of coronary angioplasty implant and graft: Secondary | ICD-10-CM | POA: Insufficient documentation

## 2020-12-19 DIAGNOSIS — I10 Essential (primary) hypertension: Secondary | ICD-10-CM | POA: Diagnosis present

## 2020-12-19 DIAGNOSIS — I2510 Atherosclerotic heart disease of native coronary artery without angina pectoris: Secondary | ICD-10-CM | POA: Insufficient documentation

## 2020-12-19 DIAGNOSIS — R0609 Other forms of dyspnea: Secondary | ICD-10-CM | POA: Diagnosis present

## 2020-12-19 HISTORY — PX: RIGHT/LEFT HEART CATH AND CORONARY ANGIOGRAPHY: CATH118266

## 2020-12-19 LAB — POCT I-STAT 7, (LYTES, BLD GAS, ICA,H+H)
Acid-base deficit: 1 mmol/L (ref 0.0–2.0)
Bicarbonate: 25.6 mmol/L (ref 20.0–28.0)
Calcium, Ion: 1.24 mmol/L (ref 1.15–1.40)
HCT: 37 % — ABNORMAL LOW (ref 39.0–52.0)
Hemoglobin: 12.6 g/dL — ABNORMAL LOW (ref 13.0–17.0)
O2 Saturation: 99 %
Potassium: 4.6 mmol/L (ref 3.5–5.1)
Sodium: 139 mmol/L (ref 135–145)
TCO2: 27 mmol/L (ref 22–32)
pCO2 arterial: 47.1 mmHg (ref 32.0–48.0)
pH, Arterial: 7.342 — ABNORMAL LOW (ref 7.350–7.450)
pO2, Arterial: 156 mmHg — ABNORMAL HIGH (ref 83.0–108.0)

## 2020-12-19 LAB — POCT I-STAT EG7
Acid-base deficit: 1 mmol/L (ref 0.0–2.0)
Acid-base deficit: 2 mmol/L (ref 0.0–2.0)
Bicarbonate: 24.8 mmol/L (ref 20.0–28.0)
Bicarbonate: 25.3 mmol/L (ref 20.0–28.0)
Calcium, Ion: 1.09 mmol/L — ABNORMAL LOW (ref 1.15–1.40)
Calcium, Ion: 1.14 mmol/L — ABNORMAL LOW (ref 1.15–1.40)
HCT: 34 % — ABNORMAL LOW (ref 39.0–52.0)
HCT: 35 % — ABNORMAL LOW (ref 39.0–52.0)
Hemoglobin: 11.6 g/dL — ABNORMAL LOW (ref 13.0–17.0)
Hemoglobin: 11.9 g/dL — ABNORMAL LOW (ref 13.0–17.0)
O2 Saturation: 73 %
O2 Saturation: 78 %
Potassium: 4.1 mmol/L (ref 3.5–5.1)
Potassium: 4.2 mmol/L (ref 3.5–5.1)
Sodium: 141 mmol/L (ref 135–145)
Sodium: 141 mmol/L (ref 135–145)
TCO2: 26 mmol/L (ref 22–32)
TCO2: 27 mmol/L (ref 22–32)
pCO2, Ven: 48.1 mmHg (ref 44.0–60.0)
pCO2, Ven: 48.5 mmHg (ref 44.0–60.0)
pH, Ven: 7.321 (ref 7.250–7.430)
pH, Ven: 7.326 (ref 7.250–7.430)
pO2, Ven: 42 mmHg (ref 32.0–45.0)
pO2, Ven: 46 mmHg — ABNORMAL HIGH (ref 32.0–45.0)

## 2020-12-19 SURGERY — RIGHT/LEFT HEART CATH AND CORONARY ANGIOGRAPHY
Anesthesia: LOCAL

## 2020-12-19 MED ORDER — SODIUM CHLORIDE 0.9 % WEIGHT BASED INFUSION: 1.0000 mL/kg/h | INTRAVENOUS | Status: DC

## 2020-12-19 MED ORDER — FENTANYL CITRATE (PF) 100 MCG/2ML IJ SOLN
INTRAMUSCULAR | Status: DC | PRN
  Administered 2020-12-19 (×2): 25 ug via INTRAVENOUS

## 2020-12-19 MED ORDER — IOHEXOL 350 MG/ML SOLN
INTRAVENOUS | Status: DC | PRN
  Administered 2020-12-19: 50 mL

## 2020-12-19 MED ORDER — SODIUM CHLORIDE 0.9% FLUSH: 3.0000 mL | Freq: Two times a day (BID) | INTRAVENOUS | Status: DC

## 2020-12-19 MED ORDER — SODIUM CHLORIDE 0.9 % IV SOLN: 250.0000 mL | INTRAVENOUS | Status: DC | PRN

## 2020-12-19 MED ORDER — HEPARIN (PORCINE) IN NACL 1000-0.9 UT/500ML-% IV SOLN
INTRAVENOUS | Status: DC | PRN
  Administered 2020-12-19 (×2): 500 mL

## 2020-12-19 MED ORDER — LIDOCAINE HCL (PF) 1 % IJ SOLN
INTRAMUSCULAR | Status: AC
  Filled 2020-12-19: qty 30

## 2020-12-19 MED ORDER — SODIUM CHLORIDE 0.9% FLUSH: 3.0000 mL | INTRAVENOUS | Status: DC | PRN

## 2020-12-19 MED ORDER — MIDAZOLAM HCL 2 MG/2ML IJ SOLN
INTRAMUSCULAR | Status: DC | PRN
  Administered 2020-12-19 (×2): 1 mg via INTRAVENOUS

## 2020-12-19 MED ORDER — HEPARIN SODIUM (PORCINE) 1000 UNIT/ML IJ SOLN
INTRAMUSCULAR | Status: AC
  Filled 2020-12-19: qty 1

## 2020-12-19 MED ORDER — ACETAMINOPHEN 325 MG PO TABS: 650.0000 mg | ORAL_TABLET | ORAL | Status: DC | PRN

## 2020-12-19 MED ORDER — ONDANSETRON HCL 4 MG/2ML IJ SOLN: 4.0000 mg | Freq: Four times a day (QID) | INTRAMUSCULAR | Status: DC | PRN

## 2020-12-19 MED ORDER — VERAPAMIL HCL 2.5 MG/ML IV SOLN
INTRAVENOUS | Status: DC | PRN
  Administered 2020-12-19: 10 mL via INTRA_ARTERIAL

## 2020-12-19 MED ORDER — SODIUM CHLORIDE 0.9 % IV SOLN: INTRAVENOUS | Status: DC

## 2020-12-19 MED ORDER — HEPARIN (PORCINE) IN NACL 1000-0.9 UT/500ML-% IV SOLN
INTRAVENOUS | Status: AC
  Filled 2020-12-19: qty 1000

## 2020-12-19 MED ORDER — ASPIRIN 81 MG PO CHEW: 81.0000 mg | CHEWABLE_TABLET | ORAL | Status: DC

## 2020-12-19 MED ORDER — SODIUM CHLORIDE 0.9 % WEIGHT BASED INFUSION
3.0000 mL/kg/h | INTRAVENOUS | Status: AC
  Administered 2020-12-19: 3 mL/kg/h via INTRAVENOUS

## 2020-12-19 MED ORDER — VERAPAMIL HCL 2.5 MG/ML IV SOLN
INTRAVENOUS | Status: AC
  Filled 2020-12-19: qty 2

## 2020-12-19 MED ORDER — LABETALOL HCL 5 MG/ML IV SOLN: 10.0000 mg | INTRAVENOUS | Status: DC | PRN

## 2020-12-19 MED ORDER — HEPARIN SODIUM (PORCINE) 1000 UNIT/ML IJ SOLN
INTRAMUSCULAR | Status: DC | PRN
  Administered 2020-12-19: 5500 [IU] via INTRAVENOUS

## 2020-12-19 MED ORDER — LIDOCAINE HCL (PF) 1 % IJ SOLN
INTRAMUSCULAR | Status: DC | PRN
  Administered 2020-12-19: 5 mL
  Administered 2020-12-19: 2 mL

## 2020-12-19 MED ORDER — MIDAZOLAM HCL 2 MG/2ML IJ SOLN
INTRAMUSCULAR | Status: AC
  Filled 2020-12-19: qty 2

## 2020-12-19 MED ORDER — HYDRALAZINE HCL 20 MG/ML IJ SOLN: 10.0000 mg | INTRAMUSCULAR | Status: DC | PRN

## 2020-12-19 MED ORDER — FENTANYL CITRATE (PF) 100 MCG/2ML IJ SOLN
INTRAMUSCULAR | Status: AC
  Filled 2020-12-19: qty 2

## 2020-12-19 SURGICAL SUPPLY — 13 items
CATH INFINITI JR4 5F (CATHETERS) ×1 IMPLANT
CATH OPTITORQUE TIG 4.0 5F (CATHETERS) ×1 IMPLANT
CATH SWAN GANZ 7F STRAIGHT (CATHETERS) ×1 IMPLANT
DEVICE RAD COMP TR BAND LRG (VASCULAR PRODUCTS) ×1 IMPLANT
GLIDESHEATH SLEND SS 6F .021 (SHEATH) ×1 IMPLANT
GLIDESHEATH SLENDER 7FR .021G (SHEATH) ×1 IMPLANT
GUIDEWIRE INQWIRE 1.5J.035X260 (WIRE) IMPLANT
INQWIRE 1.5J .035X260CM (WIRE) ×2
KIT HEART LEFT (KITS) ×2 IMPLANT
PACK CARDIAC CATHETERIZATION (CUSTOM PROCEDURE TRAY) ×2 IMPLANT
TRANSDUCER W/STOPCOCK (MISCELLANEOUS) ×2 IMPLANT
TUBING CIL FLEX 10 FLL-RA (TUBING) ×2 IMPLANT
WIRE EMERALD ST .035X150CM (WIRE) ×1 IMPLANT

## 2020-12-19 NOTE — Interval H&P Note (Signed)
History and Physical Interval Note:  12/19/2020 8:41 AM  Gabriel Anderson  has presented today for surgery, with the diagnosis of shortness of breath.  The various methods of treatment have been discussed with the patient and family. After consideration of risks, benefits and other options for treatment, the patient has consented to  Procedure(s): RIGHT/LEFT HEART CATH AND CORONARY ANGIOGRAPHY (N/A) as a surgical intervention.  The patient's history has been reviewed, patient examined, no change in status, stable for surgery.  I have reviewed the patient's chart and labs.  Questions were answered to the patient's satisfaction.     Bryan Lemma

## 2020-12-19 NOTE — Brief Op Note (Addendum)
   12/19/2020  9:33 AM PROCEDURE:  Procedure(s): RIGHT/LEFT HEART CATH AND CORONARY ANGIOGRAPHY (N/A)  SURGEON:  Surgeon(s) and Role:    * Leonie Man, MD - Primary  PATIENT:  Gabriel Anderson  50 y.o. male with history of LAD PCI in 2014, and now progressive aortic stenosis.  Most recent echocardiogram read stenosis is moderate with a mean gradient of ~35 to 37 mmHg.  He has had progressive dyspnea on exertion.  Concerned this could be possibly related to his aortic valve or CAD, he is referred for right left heart catheterization.  PRE-OPERATIVE DIAGNOSIS:  shortness of breath; moderate to severe aortic stenosis, coronary disease status post PCI  POST-OPERATIVE DIAGNOSIS:  Widely patent LAD stent with maybe 20% prestent stenosis and 20% at the distal edge of the stent.  Otherwise, no significant CAD other than long 30% proximal RCA stenosis. Discrepant cardiac output by Fick and thermodilution: Fick CO-CI 6.87-3; thermodi lution 5.61-2.45 Right Heart Cath Pressures: RA mean 7 mmHg RV peak-EDP: 32/2 mmHg-9 mmHg; PCWP 12 mmHg LVP-EDP: 174/5 mmHg - 22 mmHg; AoP - MAP: 141/73 mmHg-100 mmHg. Aortic valve gradients: P-P 33 mmHg, mean 41.9 mm.  AVA estimated between 0.95 and 1.16 cm   ANESTHESIA:   local and IV sedation; 1 mg Versed, 25 mcg fentanyl  EBL:  <20 mL  Procedure: Time Out: Verified patient identification, verified procedure, site/side was marked, verified correct patient position, special equipment/implants available, medications/allergies/relevent history reviewed, required imaging and test results available. Performed.  Access:  . RIGHT radial Artery: 6 Fr sheath -- Seldinger technique using Micropuncture Kit . Direct ultrasound guidance used.  Permanent image obtained and placed on chart. . 10 mL radial cocktail IA; 5500 units IV Heparin . * Right Brachial/Antecubital Vein: The existing 18-gauge IV was exchanged over a wire for a 7 French sheath  Right Heart  Catheterization: 7 Fr Swan Ganz catheter advanced under fluoroscopy with balloon inflated to the RA, RV, then PCWP-PA for hemodynamic measurement.  . * Simultaneous FA & PA blood gases checked for SaO2% to calculate FICK CO/CI  . * Thermodilution Injections performed to calculate CO/CI  . * Catheter removed completely out of the body with balloon deflated.  . Left Heart Catheterization: 5 Fr Catheters advanced or exchanged over a J-wire under direct fluoroscopic guidance into the ascending aorta; TIG 4.0 catheter advanced first.  . * LV Hemodynamics (LV Gram): JR4 catheter (used straight wire) . * Left Coronary Artery Cineangiography: TIG 4.0 catheter  . * Right Coronary Artery Cineangiography: JR4 catheter catheter   Upon completion of Angiogaphy, the catheter was removed completely out of the body over a wire, without complication.  Brachial Sheath(s) removed in the Cath Lab with manual pressure for hemostasis.    Radial sheath removed in the Cardiac Catheterization lab with TR Band placed for hemostasis.  TR Band: 0 925 Hours; 13 mL air  MEDICATIONS * SQ Lidocaine 1mL * Radial Cocktail: 3 mg Verapmil in 10 mL NS * Isovue Contrast: 50 * Heparin: 5500 units  DICTATION: .Note written in EPIC  PLAN OF CARE: Discharge to home after PACU  PATIENT DISPOSITION:  PACU - guarded condition.     Delay start of Pharmacological VTE agent (>24hrs) due to surgical blood loss or risk of bleeding: not applicable   Glenetta Hew, MD

## 2020-12-19 NOTE — Discharge Instructions (Signed)
Radial Site Care  This sheet gives you information about how to care for yourself after your procedure. Your health care provider may also give you more specific instructions. If you have problems or questions, contact your health care provider. What can I expect after the procedure? After the procedure, it is common to have:  Bruising and tenderness at the catheter insertion area. Follow these instructions at home: Medicines  Take over-the-counter and prescription medicines only as told by your health care provider. Insertion site care  Follow instructions from your health care provider about how to take care of your insertion site. Make sure you: ? Wash your hands with soap and water before you change your bandage (dressing). If soap and water are not available, use hand sanitizer. ? Change your dressing as told by your health care provider. ? Leave stitches (sutures), skin glue, or adhesive strips in place. These skin closures may need to stay in place for 2 weeks or longer. If adhesive strip edges start to loosen and curl up, you may trim the loose edges. Do not remove adhesive strips completely unless your health care provider tells you to do that.  Check your insertion site every day for signs of infection. Check for: ? Redness, swelling, or pain. ? Fluid or blood. ? Pus or a bad smell. ? Warmth.  Do not take baths, swim, or use a hot tub until your health care provider approves.  You may shower 24-48 hours after the procedure, or as directed by your health care provider. ? Remove the dressing and gently wash the site with plain soap and water. ? Pat the area dry with a clean towel. ? Do not rub the site. That could cause bleeding.  Do not apply powder or lotion to the site. Activity  For 24 hours after the procedure, or as directed by your health care provider: ? Do not flex or bend the affected arm. ? Do not push or pull heavy objects with the affected arm. ? Do not drive  yourself home from the hospital or clinic. You may drive 24 hours after the procedure unless your health care provider tells you not to. ? Do not operate machinery or power tools.  Do not lift anything that is heavier than 10 lb (4.5 kg), or the limit that you are told, until your health care provider says that it is safe.  Ask your health care provider when it is okay to: ? Return to work or school. ? Resume usual physical activities or sports. ? Resume sexual activity.   General instructions  If the catheter site starts to bleed, raise your arm and put firm pressure on the site. If the bleeding does not stop, get help right away. This is a medical emergency.  If you went home on the same day as your procedure, a responsible adult should be with you for the first 24 hours after you arrive home.  Keep all follow-up visits as told by your health care provider. This is important. Contact a health care provider if:  You have a fever.  You have redness, swelling, or yellow drainage around your insertion site. Get help right away if:  You have unusual pain at the radial site.  The catheter insertion area swells very fast.  The insertion area is bleeding, and the bleeding does not stop when you hold steady pressure on the area.  Your arm or hand becomes pale, cool, tingly, or numb. These symptoms may represent a serious   problem that is an emergency. Do not wait to see if the symptoms will go away. Get medical help right away. Call your local emergency services (911 in the [place] Do not drive yourself to the hospital. Summary  After the procedure, it is common to have bruising and tenderness at the site.  Follow instructions from your health care provider about how to take care of your radial site wound. Check the wound every day for signs of infection.  Do not lift anything that is heavier than 10 lb (4.5 kg), or the limit that you are told, until your health care provider says that it  is safe. This information is not intended to replace advice given to you by your health care provider. Make sure you discuss any questions you have with your health care provider. Document Revised: 09/30/2017 Document Reviewed: 09/30/2017 Elsevier Patient Education  2021 Elsevier Inc.  

## 2020-12-19 NOTE — Research (Signed)
[place] Informed Consent   Subject Name: Gabriel Anderson  Subject met inclusion and exclusion criteria.  The informed consent form, study requirements and expectations were reviewed with the subject and questions and concerns were addressed prior to the signing of the consent form.  The subject verbalized understanding of the trail requirements.  The subject agreed to participate in the [facility] trial and signed the informed consent.  The informed consent was obtained prior to performance of any protocol-specific procedures for the subject.  A copy of the signed informed consent was given to the subject and a copy was placed in the subject's medical record.  [name] D 12/19/2020, [id number]

## 2020-12-20 ENCOUNTER — Encounter (HOSPITAL_COMMUNITY): Payer: Self-pay | Admitting: Cardiology

## 2021-02-23 ENCOUNTER — Other Ambulatory Visit: Payer: Self-pay | Admitting: Cardiology

## 2021-03-22 ENCOUNTER — Telehealth: Payer: Self-pay | Admitting: *Deleted

## 2021-03-22 DIAGNOSIS — I350 Nonrheumatic aortic (valve) stenosis: Secondary | ICD-10-CM

## 2021-03-22 MED ORDER — FUROSEMIDE 20 MG PO TABS: 20.0000 mg | ORAL_TABLET | ORAL | 1 refills | Status: DC

## 2021-03-22 NOTE — Telephone Encounter (Signed)
Let's try lasix 20mg  PO QD PRN.  Take for 3-4 days to help with edema.   Given the severity of aortic stenosis and symptoms, let's have him discuss with [facility] - Dr. . I would like to get his opinion on surgical timing.  Thanks   Laneta Simmers, MD   March 20, 2021  March 22, 2021" to  [place], MD       2:23 PM Good afternoon Dr. [name]. I've been having some issues with my left foot swelling up. Not super bad today, but was really swollen 7/9-12 ([place] My issue with being out of breath has gone from mostly inconvenient stopping to catch breath to noticeable muscle fatigue in thighs and shoulders. I know we just checked everything out, but with my foot swelling and progression in fatigue i thought it best to reach out. Thank you!  Pt is aware of the above information.  He reports the swelling in his left foot has improved quite a bit.  He would like the prn RX sent into [facility]   He appreciates the referral to Dr [id number] and understands his office will contact him to be scheduled.  He will c/b if any further questions or concerns.

## 2021-04-23 ENCOUNTER — Other Ambulatory Visit: Payer: Self-pay | Admitting: Cardiology

## 2021-04-25 ENCOUNTER — Encounter: Payer: No Typology Code available for payment source | Admitting: Surgery

## 2021-04-26 ENCOUNTER — Other Ambulatory Visit: Payer: Self-pay | Admitting: Surgery

## 2021-04-26 ENCOUNTER — Other Ambulatory Visit: Payer: Self-pay

## 2021-04-26 ENCOUNTER — Institutional Professional Consult (permissible substitution): Payer: No Typology Code available for payment source | Admitting: Surgery

## 2021-04-26 ENCOUNTER — Encounter: Payer: Self-pay | Admitting: Surgery

## 2021-04-26 VITALS — BP 110/66 | HR 80 | Resp 20 | Ht 70.0 in | Wt 245.0 lb

## 2021-04-26 DIAGNOSIS — I712 Thoracic aortic aneurysm, without rupture, unspecified: Secondary | ICD-10-CM

## 2021-04-26 DIAGNOSIS — I350 Nonrheumatic aortic (valve) stenosis: Secondary | ICD-10-CM

## 2021-04-26 NOTE — Progress Notes (Signed)
Cardiothoracic Surgery Consultation  PCP is Janith Lima, MD Referring Provider is Jerline Pain, MD  Chief Complaint  Patient presents with   Aortic Stenosis    Surgical consult, ECHO 12/14/20, Cardiac Cath 12/19/20    HPI:  The patient is a 50 year old gentleman with history of hypertension, hyperlipidemia, osteoarthritis, coronary disease status post MI and PCI with LAD stenting x2 in 2013 as well as aortic stenosis that has been followed with periodic echocardiograms.  A 2D echo in February 2020 showed a severely calcified trileaflet aortic valve with a mean gradient of 39 mmHg and a valve area of 1.0 cm.  Left ventricular ejection fraction was 55%.  His mean gradient 6 months later was measured at 28 mmHg.  Then in June 2021 his aortic valve mean gradient was measured at 34 mmHg.  His most recent echo on 12/14/2020 showed a mean gradient of 35.3 mmHg with a peak gradient of 60 mmHg.  Aortic valve area by VTI was 1.06 cm.  Dimensionless index was 0.22.  There was mild aortic insufficiency with a pressure half-time of 376 ms.  Aortic root diameter was measured at 3.6 cm with an ascending aortic diameter 4.0 cm.  Left ventricular ejection fraction was 50 to 55% with mild LVH and grade 1 diastolic dysfunction.  The patient is here today with his wife.  Gabriel Anderson reports having problems over the past 2 years with exertional shortness of breath and fatigue which has progressed over the past 6 months.  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  Gabriel Anderson frequently has extreme fatigue in his legs and has to stop.  Gabriel Anderson said that Gabriel Anderson recently had to walk from his car into the [facility] and had to stop a few times to rest which is very abnormal for him.  Gabriel Anderson has also noticed some swelling in his ankles recently.  Gabriel Anderson denies any dizziness or syncope.  Gabriel Anderson has had no chest pain or pressure.  Gabriel Anderson reports having to have a couple teeth extracted before shoulder  surgery last September.  Gabriel Anderson does see a dentist regularly and was just there on 03/22/2021. Past Medical History:  Diagnosis Date   Acute myocardial infarction, subendocardial infarction, initial episode of care [facility]) 2013   Cardiac catheterization 06/02/12-widely patent previously placed LAD stents. Troponin peaked 3. Normal EF.   Anemia    Arthritis    Coronary atherosclerosis of native coronary artery 05/21/2012   Cardiac catheterization 05/21/12-95% mid LAD lesion at the bifurcation of a large diagonal branch. Normal ejection fraction.   Heart murmur    Hyperlipidemia    Hypertension    Pneumonia    Raynaud's disease    Vasospasm ([place])    takes imdur for    Past Surgical History:  Procedure Laterality Date    cardiacstent x 2  2013   CARDIAC CATHETERIZATION  05/2012   PCI LAD at takeoff of large diagonal   ELBOW SURGERY Left 1984   LEFT HEART CATHETERIZATION WITH CORONARY ANGIOGRAM N/A 05/21/2012   Procedure: LEFT HEART CATHETERIZATION WITH CORONARY ANGIOGRAM;  Surgeon: Jettie Booze, MD;  Location: [facility] CATH LAB;  Service: Cardiovascular;  Laterality: N/A;   LEFT HEART CATHETERIZATION WITH CORONARY ANGIOGRAM N/A 06/02/2012   Procedure: LEFT HEART CATHETERIZATION WITH CORONARY ANGIOGRAM;  Surgeon: Candee Furbish, MD;  Location: [facility] CATH LAB;  Service: Cardiovascular;  Laterality: N/A;   PERCUTANEOUS CORONARY STENT INTERVENTION (PCI-S)  05/21/2012  Procedure: PERCUTANEOUS CORONARY STENT INTERVENTION (PCI-S);  Surgeon: Jettie Booze, MD;  Location: [facility] CATH LAB;  Service: Cardiovascular;;   REVERSE SHOULDER ARTHROPLASTY Right 05/23/2020   Procedure: REVERSE SHOULDER ARTHROPLASTY;  Surgeon: Hiram Gash, MD;  Location: [facility] ORS;  Service: Orthopedics;  Laterality: Right;   RIGHT/LEFT HEART CATH AND CORONARY ANGIOGRAPHY N/A 12/19/2020   Procedure: RIGHT/LEFT HEART CATH AND CORONARY ANGIOGRAPHY;  Surgeon: Leonie Man, MD;  Location: [facility];  Service: Cardiovascular;   Laterality: N/A;   ROTATOR CUFF REPAIR Right 2009   TOTAL HIP ARTHROPLASTY Left 12/30/2013   Procedure: LEFT TOTAL HIP ARTHROPLASTY ANTERIOR APPROACH;  Surgeon: Mcarthur Rossetti, MD;  Location: [facility] ORS;  Service: Orthopedics;  Laterality: Left;    Family History  Problem Relation Age of Onset   Hypertension Father    Diabetes Father    Heart failure Father    Heart disease Father    Stroke Sister    COPD Mother     Social History Social History   Tobacco Use   Smoking status: Never   Smokeless tobacco: Never  Vaping Use   Vaping Use: Never used  Substance Use Topics   Alcohol use: Yes    Alcohol/week: 34.0 standard drinks    Types: 28 Cans of beer, 2 Shots of liquor, 4 Standard drinks or equivalent per week    Comment: moderate   Drug use: Yes    Types: Marijuana    Comment: marijuana 0.05 grams smoked  per day for joint pain    Current Outpatient Medications  Medication Sig Dispense Refill   amoxicillin (AMOXIL) 500 MG tablet Take 2,000 mg by mouth See admin instructions. Dental procedures     aspirin 81 MG tablet Take 1 tablet (81 mg total) by mouth daily.     candesartan (ATACAND) 32 MG tablet Take 1 tablet (32 mg total) by mouth daily. 90 tablet 1   Cholecalciferol (VITAMIN D3) 50 MCG (2000 UT) TABS TAKE 1 TABLET BY MOUTH EVERY DAY (Patient taking differently: Take 2,000 Units by mouth daily.) 100 tablet 1   Coenzyme Q10 100 MG capsule Take 100 mg by mouth daily.     furosemide (LASIX) 20 MG tablet Take 1 tablet (20 mg total) by mouth as directed. Please schedule appointment for October 2022 for future refills. Thank you 30 tablet 1   nitroGLYCERIN (NITROSTAT) 0.4 MG SL tablet Place 1 tablet (0.4 mg total) under the tongue every 5 (five) minutes as needed. (Patient not taking: Reported on 12/14/2020) 25 tablet 6   Omega-3 Fatty Acids (FISH OIL) 1000 MG CAPS Take 1,000 mg by mouth daily.     rosuvastatin (CRESTOR) 40 MG tablet TAKE 1 TABLET BY MOUTH EVERY DAY 90  tablet 1   scopolamine (TRANSDERM-SCOP) 1 MG/3DAYS Place 1 patch (1.5 mg total) onto the skin every 3 (three) days. (Patient not taking: Reported on 12/14/2020) 4 patch 0   No current facility-administered medications for this visit.    No Known Allergies  Review of Systems  Constitutional:  Positive for activity change and fatigue.  HENT:  Negative for dental problem.   Eyes: Negative.   Respiratory:  Positive for shortness of breath.   Cardiovascular:  Positive for palpitations and leg swelling. Negative for chest pain.  Gastrointestinal:        Hiatal hernia  Endocrine: Negative.   Genitourinary: Negative.   Musculoskeletal:  Positive for arthralgias, joint swelling and myalgias.       Difficulty walking  Skin:  Positive for rash.  Allergic/Immunologic: Negative.   Neurological:  Negative for dizziness and syncope.  Hematological: Negative.   Psychiatric/Behavioral: Negative.     There were no vitals taken for this visit. Physical Exam Constitutional:      Appearance: Normal appearance.  HENT:     Head: Normocephalic and atraumatic.  Eyes:     Extraocular Movements: Extraocular movements intact.     Pupils: Pupils are equal, round, and reactive to light.  Neck:     Vascular: No carotid bruit.  Cardiovascular:     Rate and Rhythm: Normal rate and regular rhythm.     Pulses: Normal pulses.     Heart sounds: Murmur heard.     Comments: 3/6 systolic murmur along the right sternal border.  There is no diastolic murmur. Pulmonary:     Effort: Pulmonary effort is normal.     Breath sounds: Normal breath sounds.  Abdominal:     Tenderness: There is no abdominal tenderness.  Musculoskeletal:        General: No swelling.     Cervical back: Normal range of motion and neck supple.  Skin:    General: Skin is warm and dry.  Neurological:     General: No focal deficit present.     Mental Status: Gabriel Anderson is alert and oriented to person, place, and time.  Psychiatric:        Mood  and Affect: Mood normal.        Behavior: Behavior normal.     Diagnostic Tests:  ECHOCARDIOGRAM REPORT         Patient Name:   Gabriel Anderson Date of Exam: 12/14/2020  Medical Rec #:  [id number]        Height:       70.0 in  Accession #:    [id number]       Weight:       240.0 lb  Date of Birth:  07/13/1971        BSA:          2.255 m  Patient Age:    70 years         BP:           142/70 mmHg  Patient Gender: M                HR:           58 bpm.  Exam Location:  [place]   Procedure: 2D Echo, Cardiac Doppler and Color Doppler   Indications:    I35.0 AS     History:        Patient has prior history of Echocardiogram examinations,  most                  recent 02/15/2020. CAD, ASH, AS, Signs/Symptoms:Dilated  aortic                  root; Risk Factors:Obesity.     Sonographer:    Coralyn Helling RDCS  Referring Phys: [place]     1. Left ventricular ejection fraction, by estimation, is 50 to 55%. The  left ventricle has low normal function. The left ventricle has no regional  wall motion abnormalities. There is mild left ventricular hypertrophy.  Left ventricular diastolic  parameters are consistent with Grade I diastolic dysfunction (impaired  relaxation). Elevated left atrial pressure.   2. Right ventricular systolic function is normal. The right ventricular  size  is normal.   3. The mitral valve is normal in structure. Trivial mitral valve  regurgitation. No evidence of mitral stenosis.   4. The aortic valve has an indeterminant number of cusps. Aortic valve  regurgitation is mild. Moderate to severe aortic valve stenosis.   5. Aortic dilatation noted. There is mild dilatation of the ascending  aorta, measuring 40 mm.   6. The inferior vena cava is normal in size with greater than 50%  respiratory variability, suggesting right atrial pressure of 3 mmHg.   FINDINGS   Left Ventricle: Left ventricular ejection fraction, by estimation,  is 50  to 55%. The left ventricle has low normal function. The left ventricle has  no regional wall motion abnormalities. The left ventricular internal  cavity size was normal in size.  There is mild left ventricular hypertrophy. Left ventricular diastolic  parameters are consistent with Grade I diastolic dysfunction (impaired  relaxation). Elevated left atrial pressure.   Right Ventricle: The right ventricular size is normal.Right ventricular  systolic function is normal.   Left Atrium: Left atrial size was normal in size.   Right Atrium: Right atrial size was normal in size.   Pericardium: There is no evidence of pericardial effusion.   Mitral Valve: The mitral valve is normal in structure. Trivial mitral  valve regurgitation. No evidence of mitral valve stenosis.   Tricuspid Valve: The tricuspid valve is normal in structure. Tricuspid  valve regurgitation is trivial. No evidence of tricuspid stenosis.   Aortic Valve: The aortic valve has an indeterminant number of cusps.  Aortic valve regurgitation is mild. Aortic regurgitation PHT measures 376  msec. Moderate to severe aortic stenosis is present. Aortic valve mean  gradient measures 35.3 mmHg. Aortic  valve peak gradient measures 60.0 mmHg. Aortic valve area, by VTI measures  1.06 cm.   Pulmonic Valve: The pulmonic valve was normal in structure. Pulmonic valve  regurgitation is not visualized. No evidence of pulmonic stenosis.   Aorta: Aortic dilatation noted. There is mild dilatation of the ascending  aorta, measuring 40 mm.   Venous: The inferior vena cava is normal in size with greater than 50%  respiratory variability, suggesting right atrial pressure of 3 mmHg.   IAS/Shunts: No atrial level shunt detected by color flow Doppler.      LEFT VENTRICLE  PLAX 2D  LVIDd:         4.60 cm  Diastology  LVIDs:         3.30 cm  LV e' medial:    5.44 cm/s  LV PW:         1.40 cm  LV E/e' medial:  18.9  LV IVS:        1.30  cm  LV e' lateral:   6.64 cm/s  LVOT diam:     2.50 cm  LV E/e' lateral: 15.5  LV SV:         102  LV SV Index:   45  LVOT Area:     4.91 cm      RIGHT VENTRICLE             IVC  RV S prime:     10.20 cm/s  IVC diam: 1.40 cm  TAPSE (M-mode): 2.1 cm   LEFT ATRIUM             Index       RIGHT ATRIUM           Index  LA diam:  4.10 cm 1.82 cm/m  RA Pressure: 3.00 mmHg  LA Vol (A2C):   78.3 ml 34.72 ml/m RA Area:     17.30 cm  LA Vol (A4C):   57.4 ml 25.45 ml/m RA Volume:   50.60 ml  22.43 ml/m  LA Biplane Vol: 71.4 ml 31.66 ml/m   AORTIC VALVE  AV Area (Vmax):    1.05 cm  AV Area (Vmean):   1.07 cm  AV Area (VTI):     1.06 cm  AV Vmax:           387.33 cm/s  AV Vmean:          278.667 cm/s  AV VTI:            0.960 m  AV Peak Grad:      60.0 mmHg  AV Mean Grad:      35.3 mmHg  LVOT Vmax:         82.50 cm/s  LVOT Vmean:        60.800 cm/s  LVOT VTI:          0.207 m  LVOT/AV VTI ratio: 0.22  AI PHT:            376 msec     AORTA  Ao Root diam: 3.60 cm  Ao Asc diam:  4.00 cm   MV E velocity: 103.00 cm/s  TRICUSPID VALVE  MV A velocity: 111.00 cm/s  Estimated RAP:  3.00 mmHg  MV E/A ratio:  0.93                              SHUNTS                              Systemic VTI:  0.21 m                              Systemic Diam: 2.50 cm   Kirk Ruths MD  Electronically signed by Kirk Ruths MD  Signature Date/Time: 12/14/2020/10:41:01 AM         Final    Physicians  Panel Physicians Referring Physician Case Authorizing Physician  Leonie Man, MD (Primary)     Procedures  RIGHT/LEFT HEART CATH AND CORONARY ANGIOGRAPHY   Conclusion    Prox LAD to Mid LAD lesion is 20% stenosed. Mid LAD lesion is 20% stenosed. LV end diastolic pressure is moderately elevated. There is moderate aortic valve stenosis.   POST-OPERATIVE DIAGNOSIS:   Widely patent LAD stent with maybe 20% prestent stenosis and 20% at the distal edge of the stent.  Otherwise,  no significant CAD other than long 30% proximal RCA stenosis. Discrepant cardiac output by Fick and thermodilution: Fick CO-CI 6.87-3; thermodilution 5.61-2.45 Right Heart Cath Pressures:  RA mean 7 mmHg RVP-EDP: 32/2 mmHg-9 mmHg;  PCWP 12 mmHg; PA P 28/6 mmHg-mean 13 mmHg. LVP-EDP: 174/5 mmHg - 22 mmHg; AoP - MAP: 141/73 mmHg-100 mmHg. Aortic valve gradients: P-P 33 mmHg, mean 41.9 mm.  AVA estimated between 0.95 and 1.16 cm -> also discrepant findings, would suggest moderate to severe AS.     RECOMMENDATIONS Discharge home and follow-up with Dr. Marlou Porch.   Based on symptoms, and echocardiographic evidence, Gabriel Anderson may be at the point where it meets criteria for consideration for valve placement.  Borderline pressure readings, but AVA estimations are not  severe.     Results discussed with primary cardiologist.     Glenetta Hew, MD     Recommendations  Antiplatelet/Anticoag Recommend Aspirin $RemoveBeforeDEI'81mg'[id number]$  daily for moderate CAD. Existing stent and aortic stenosis  Discharge Date In the absence of any other complications or medical issues, we expect the patient to be ready for discharge from a cath perspective on 12/19/2020.   Surgeon Notes    12/19/2020  8:43 PM Brief Op Note addendum by Leonie Man, MD   Indications  Moderate to severe aortic stenosis [I35.0 (ICD-10-CM)]  Dyspnea on exertion [R06.00 (ICD-10-CM)]  CAD S/P percutaneous coronary angioplasty [I25.10, Z98.61 (ICD-10-CM)]   Procedural Details  Technical Details Primary Care Provider: Janith Lima, MD Cardiologist: Candee Furbish, MD  PATIENT:  Gabriel Anderson  50 y.o. male with history of LAD PCI in 2014, and now progressive aortic stenosis.  Most recent echocardiogram read stenosis is moderate with a mean gradient of ~35 to 37 mmHg.  Gabriel Anderson has had progressive dyspnea on exertion.  Concerned this could be possibly related to his aortic valve or CAD, Gabriel Anderson is referred for right left heart catheterization.   PRE-OPERATIVE  DIAGNOSIS:  shortness of breath; moderate to severe aortic stenosis, coronary disease status post PCI  Procedure: Time Out: Verified patient identification, verified procedure, site/side was marked, verified correct patient position, special equipment/implants available, medications/allergies/relevent history reviewed, required imaging and test results available. Performed.   Access:   RIGHT radial Artery: 6 Fr sheath -- Seldinger technique using Micropuncture Kit  Direct ultrasound guidance used.  Permanent image obtained and placed on chart.  10 mL radial cocktail IA; 5500 units IV Heparin  * Right Brachial/Antecubital Vein: The existing 18-gauge IV was exchanged over a wire for a 7 French sheath   Right Heart Catheterization: 7 Fr Swan Ganz catheter advanced under fluoroscopy with balloon inflated to the RA, RV, then PCWP-PA for hemodynamic measurement.   * Simultaneous FA & PA blood gases checked for SaO2% to calculate FICK CO/CI   * Thermodilution Injections performed to calculate CO/CI   * Catheter removed completely out of the body with balloon deflated.    Left Heart Catheterization: 5 Fr Catheters advanced or exchanged over a J-wire under direct fluoroscopic guidance into the ascending aorta; TIG 4.0 catheter advanced first.   * LV Hemodynamics (LV Gram): JR4 catheter (used straight wire)  * Left Coronary Artery Cineangiography: TIG 4.0 catheter   * Right Coronary Artery Cineangiography: JR4 catheter catheter    Upon completion of Angiogaphy, the catheter was removed completely out of the body over a wire, without complication.   Brachial Sheath(s) removed in the Cath Lab with manual pressure for hemostasis.    Radial sheath removed in the Cardiac Catheterization lab with TR Band placed for hemostasis.   TR Band: 0 925 Hours; 13 mL air   MEDICATIONS * SQ Lidocaine 18mL * Radial Cocktail: 3 mg Verapmil in 10 mL NS * Isovue Contrast: 50 * Heparin: 5500  units Estimated blood loss <50 mL.   During this procedure medications were administered to achieve and maintain moderate conscious sedation while the patient's heart rate, blood pressure, and oxygen saturation were continuously monitored and I was present face-to-face 100% of this time.   Medications (Filter: Administrations occurring from 0824 to 0928 on 12/19/20)  important  Continuous medications are totaled by the amount administered until 12/19/20 0928.   midazolam (VERSED) injection (mg) Total dose:  2 mg Date/Time Rate/Dose/Volume Action   12/19/20 0841 1  mg Given   0851 1 mg Given    fentaNYL (SUBLIMAZE) injection (mcg) Total dose:  50 mcg Date/Time Rate/Dose/Volume Action   12/19/20 0842 25 mcg Given   0851 25 mcg Given    lidocaine (PF) (XYLOCAINE) 1 % injection (mL) Total volume:  7 mL Date/Time Rate/Dose/Volume Action   12/19/20 0851 2 mL Given   0852 5 mL Given    Radial Cocktail/Verapamil only (mL) Total volume:  10 mL Date/Time Rate/Dose/Volume Action   12/19/20 0855 10 mL Given    heparin sodium (porcine) injection (Units) Total dose:  5,500 Units Date/Time Rate/Dose/Volume Action   12/19/20 0907 5,500 Units Given    iohexol (OMNIPAQUE) 350 MG/ML injection (mL) Total volume:  50 mL Date/Time Rate/Dose/Volume Action   12/19/20 0925 50 mL Given    Heparin (Porcine) in NaCl 1000-0.9 UT/500ML-% SOLN (mL) Total volume:  1,000 mL Date/Time Rate/Dose/Volume Action   12/19/20 0835 500 mL Given   0835 500 mL Given    Sedation Time  Sedation Time Physician-1: 40 minutes 32 seconds Radiation/Fluoro  Fluoro time: 9.2 (min) DAP: 77824 (mGycm2) Cumulative Air Kerma: 235 (mGy) Complications  Complications documented before study signed (12/19/2020  3:61 PM)   No complications were associated with this study.  Documented by Leonie Man, MD - 12/19/2020  8:47 PM     Coronary Findings  Diagnostic Dominance: Right Left Main  Vessel is large.   Left Anterior Descending  Prox LAD to Mid LAD lesion is 20% stenosed. The lesion is focal and eccentric.  Mid LAD lesion is 20% stenosed. The lesion was previously treated using a drug eluting stent over 2 years ago. Previously placed stent displays no restenosis.  First Diagonal Branch  Vessel is moderate in size.  First Septal Branch  Vessel is small in size.  Left Circumflex  Vessel is large.  First Obtuse Marginal Branch  Vessel is small in size.  Left Posterior Atrioventricular Artery  Vessel is small in size.  Right Coronary Artery  Second Right Posterolateral Branch  Vessel is small in size.  Intervention  No interventions have been documented. Right Heart  Right Heart Pressures PAP 28/6 mmHg-mean 13 mmHg. PCWP 12 mmHg Elevated LV EDP consistent with volume overload. LVP-EDP: 174/5 mmHg - 22 mmHg; AoP - MAP: 141/73 mmHg-100 mmHg. PA sat 75%.  AO sat 99% Discrepant Cardiac Output-index by Fick and thermodilution: Fick CO-CI 6.87-3; thermodilution 5.61-2.45  Right Atrium Right atrial pressure is normal. RA mean 7 mmHg  Right Ventricle RVP-EDP: 32/2 mmHg-9 mmHg   Wall Motion  Resting    No left ventriculography         Left Heart  Left Ventricle LV end diastolic pressure is moderately elevated.  Aortic Valve There is moderate aortic valve stenosis. Somewhat discrepant numbers with the peak gradient being lower than the mean gradient.  The gradients would suggest moderate to severe (closer to severe) aortic stenosis, however the valve was crossed relatively easily. The aortic valve is calcified. Aortic valve gradients: P-P 33 mmHg &  mean 41.9 mm.  AVA estimated between 0.95 and 1.16   Coronary Diagrams  Diagnostic Dominance: Right Intervention  Implants     No implant documentation for this case.   Syngo Images   Show images for CARDIAC CATHETERIZATION Images on Long Term Storage   Show images for Christohper, Dube "[name]" Link to Procedure Log  Procedure  Log    Hemo Data  Flowsheet Row Most Recent Value  Fick Cardiac Output  6.87 L/min  Fick Cardiac Output Index 3 (L/min)/BSA  Thermal Cardiac Output 5.61 L/min  Thermal Cardiac Output Index 2.45 (L/min)/BSA  Aortic Mean Gradient 41.88 mmHg  Aortic Peak Gradient 33 mmHg  Aortic Valve Area 0.95  Aortic Value Area Index 0.41 cm2/BSA  RA A Wave 10 mmHg  RA V Wave 6 mmHg  RA Mean 4 mmHg  RV Systolic Pressure 33 mmHg  RV Diastolic Pressure 3 mmHg  RV EDP 9 mmHg  PA Systolic Pressure 28 mmHg  PA Diastolic Pressure 6 mmHg  PA Mean 13 mmHg  PW A Wave 16 mmHg  PW V Wave 18 mmHg  PW Mean 15 mmHg  AO Systolic Pressure 320 mmHg  AO Diastolic Pressure 72 mmHg  AO Mean 98 mmHg  LV Systolic Pressure 233 mmHg  LV Diastolic Pressure 5 mmHg  LV EDP 22 mmHg  AOp Systolic Pressure 435 mmHg  AOp Diastolic Pressure 73 mmHg  AOp Mean Pressure 686 mmHg  LVp Systolic Pressure 168 mmHg  LVp Diastolic Pressure 5 mmHg  LVp EDP Pressure 20 mmHg  TPVR Index 5.31 HRUI  TSVR Index 40.02 HRUI  TPVR/TSVR Ratio 0.13     Impression:  This 50 year old gentleman has stage D, severe, symptomatic aortic stenosis with New York Heart Association class II symptoms of exertional fatigue and shortness of breath consistent with chronic diastolic congestive heart failure.  I have personally reviewed his 2D echocardiogram and cardiac catheterization.  His echocardiogram shows a possible functionally bicuspid aortic valve that is heavily calcified and thickened with markedly restricted movement of his leaflets.  The mean gradient is 35 mmHg with a dimensionless index of 0.22 and a valve area by VTI of 1.06 cm consistent with severe aortic stenosis.  Cardiac catheterization shows patent stents in the LAD with otherwise mild insignificant narrowing in the LAD proximal and distal to the stented section.  I think aortic valve replacement is indicated in this patient for relief of his progressive symptoms and to prevent left  ventricular deterioration.  I do not think Gabriel Anderson is a candidate for transcatheter aortic valve replacement given his age.  I discussed open surgical aortic valve replacement with the patient and his wife and reviewed the echo and catheterization images.  I discussed the alternatives of mechanical and bioprosthetic valves.  Given his age of 72 years I think a mechanical valve would be the best option for him.  I discussed the need for lifelong anticoagulation with Coumadin and continued follow-up of his INR, avoiding nonsteroidal anti-inflammatory agents, potential complications related to alcohol use and Gabriel Anderson does not feel that that would be an issue.  Gabriel Anderson has no history of GI bleeding.  Gabriel Anderson would like to use a mechanical valve if possible to minimize the chance of structural valve deterioration in the future.  I would use an On-X valve if possible which would allow a lower goal INR after 3 months.  Gabriel Anderson will need a preoperative CTA of the chest since his ascending aorta was measured at 4 cm by echo.  Gabriel Anderson would like to proceed with surgery but wants to wait until after football season since his son is in his senior year playing for [employer].  I told him that I thought waiting a couple months is feasible as long as his symptoms are stable and Gabriel Anderson is not overdoing it.  I told him that if Gabriel Anderson develops any worsening of his symptoms then we need to proceed with surgery sooner.  Gabriel Anderson and his wife  understand this.   Plan:  Gabriel Anderson will be scheduled for a CTA of the chest to assess for ascending aortic aneurysm.  I will call him with results.  Gabriel Anderson will call us back to schedule aortic valve replacement within the next few months.  I spent 60 minutes performing this consultation and > 50% of this time was spent face to face counseling and coordinating the care of this patient's severe aortic stenosis. Gaye Pollack, MD [facility] [phone]

## 2021-05-07 ENCOUNTER — Other Ambulatory Visit: Payer: Self-pay | Admitting: Cardiology

## 2021-05-16 ENCOUNTER — Ambulatory Visit
Admission: RE | Admit: 2021-05-16 | Discharge: 2021-05-16 | Disposition: A | Discharge status: Home / Self Care | Payer: No Typology Code available for payment source | Source: Ambulatory Visit | Attending: Surgery | Admitting: Surgery

## 2021-05-16 DIAGNOSIS — I712 Thoracic aortic aneurysm, without rupture, unspecified: Secondary | ICD-10-CM

## 2021-05-16 MED ORDER — IOPAMIDOL (ISOVUE-370) INJECTION 76%
75.0000 mL | Freq: Once | INTRAVENOUS | Status: AC | PRN
  Administered 2021-05-16: 75 mL via INTRAVENOUS

## 2021-05-21 ENCOUNTER — Other Ambulatory Visit: Payer: Self-pay | Admitting: *Deleted

## 2021-05-21 DIAGNOSIS — I350 Nonrheumatic aortic (valve) stenosis: Secondary | ICD-10-CM

## 2021-05-21 DIAGNOSIS — Z01818 Encounter for other preprocedural examination: Secondary | ICD-10-CM

## 2021-05-22 ENCOUNTER — Encounter: Payer: Self-pay | Admitting: *Deleted

## 2021-05-22 ENCOUNTER — Telehealth: Payer: Self-pay | Admitting: *Deleted

## 2021-05-22 DIAGNOSIS — I10 Essential (primary) hypertension: Secondary | ICD-10-CM

## 2021-05-22 MED ORDER — CANDESARTAN CILEXETIL 32 MG PO TABS
32.0000 mg | ORAL_TABLET | Freq: Every day | ORAL | 1 refills | Status: DC
Start: 2021-05-22 — End: 2021-09-21

## 2021-05-22 NOTE — Telephone Encounter (Signed)
-----   Message from Deliah Boston, RN sent at 05/21/2021  8:19 PM EDT ----- Regarding: FW: Refill Candesartan  ----- Message ----- From: Jake Bathe, MD Sent: 05/21/2021   5:59 PM EDT To: [facility] Triage Subject: Refill Candesartan                             Please refill his candesartan  Thanks  Donato Schultz, MD

## 2021-05-22 NOTE — Telephone Encounter (Signed)
Candesartan has been refilled to [facility] per staff message below.

## 2021-06-24 ENCOUNTER — Ambulatory Visit (INDEPENDENT_AMBULATORY_CARE_PROVIDER_SITE_OTHER): Payer: No Typology Code available for payment source | Admitting: Physician Assistant

## 2021-06-24 ENCOUNTER — Encounter: Payer: Self-pay | Admitting: Physician Assistant

## 2021-06-24 ENCOUNTER — Other Ambulatory Visit: Payer: Self-pay

## 2021-06-24 VITALS — Ht 69.5 in | Wt 249.2 lb

## 2021-06-24 DIAGNOSIS — M1611 Unilateral primary osteoarthritis, right hip: Secondary | ICD-10-CM | POA: Diagnosis not present

## 2021-06-24 NOTE — Progress Notes (Signed)
Office Visit Note   Patient: Gabriel Anderson           Date of Birth: 1971-06-01           MRN: [id number] Visit Date: 06/24/2021              Requested by: [name], MD [address] [place],  [place] [address] PCP: [name], MD   Assessment & Plan: Visit Diagnoses:  1. Unilateral primary osteoarthritis, right hip     Plan: We will have him undergo an intra-articular injection of the right hip.  He will follow-up with [place] as needed.  Questions encouraged and answered at length.  Follow-Up Instructions: Return if symptoms worsen or fail to improve.   Orders:  No orders of the defined types were placed in this encounter.  No orders of the defined types were placed in this encounter.     Procedures: No procedures performed   Clinical Data: No additional findings.   Subjective: Chief Complaint  Patient presents with   Left Hip - Pain     HPI [name] returns today due to right hip pain he has known osteoarthritis of the right hip.  He is requesting repeat intra-articular injection in the right hip.  He status post left total hip arthroplasty 12/31/2013.  He does not want to discuss surgery on the right hip at this point time due to the fact that he has to undergo an aortic valve replacement in the near future.  He has had no new injury to the right hip. Review of Systems See HPI otherwise negative  Objective: Vital Signs: Ht 5' 9.5" (1.765 m)   Wt 249 lb 3.2 oz (113 kg)   BMI 36.27 kg/m   Physical Exam General well-developed well-nourished male who ambulates without any assistive device walks with a slight antalgic gait. Ortho Exam Left hip good range of motion without pain.  Right hip limited external rotation and no internal rotation.  Attempts of internal rotation cause pain. Specialty Comments:  No specialty comments available.  Imaging: No results found.   PMFS History: Patient Active Problem List   Diagnosis Date Noted   DOE  (dyspnea on exertion) 12/19/2020   Deficiency anemia 03/28/2020   Need for hepatitis C screening test 03/27/2020   Traumatic complete tear of right rotator cuff 12/19/2019   Unilateral primary osteoarthritis, right hip 11/17/2019   Hyperglycemia 11/01/2018   Routine general medical examination at a health care facility 11/01/2018   Colon cancer screening 11/01/2018   Vitamin D deficiency 11/01/2018   Morbid obesity (HCC) 10/26/2018   CAD S/P percutaneous coronary angioplasty 10/26/2018   Moderate to severe aortic stenosis 10/26/2018   Arthritis of left hip 12/30/2013   Essential hypertension    Dyslipidemia, goal LDL below 70 06/02/2012   Coronary atherosclerosis of native coronary artery 05/21/2012   Past Medical History:  Diagnosis Date   Acute myocardial infarction, subendocardial infarction, initial episode of care [facility]) 2013   Cardiac catheterization 06/02/12-widely patent previously placed LAD stents. Troponin peaked 3. Normal EF.   Anemia    Arthritis    Coronary atherosclerosis of native coronary artery 05/21/2012   Cardiac catheterization 05/21/12-95% mid LAD lesion at the bifurcation of a large diagonal branch. Normal ejection fraction.   Heart murmur    Hyperlipidemia    Hypertension    Pneumonia    Raynaud's disease    Vasospasm (HCC)    takes imdur for    [facility]  History  Problem Relation Age of Onset   Hypertension Father    Diabetes Father    Heart failure Father    Heart disease Father    Stroke Sister    COPD Mother     Past Surgical History:  Procedure Laterality Date    cardiacstent x 2  2013   CARDIAC CATHETERIZATION  05/2012   PCI LAD at takeoff of large diagonal   ELBOW SURGERY Left 1984   LEFT HEART CATHETERIZATION WITH CORONARY ANGIOGRAM N/A 05/21/2012   Procedure: LEFT HEART CATHETERIZATION WITH CORONARY ANGIOGRAM;  Surgeon: Corky Crafts, MD;  Location: [facility] CATH LAB;  Service: Cardiovascular;  Laterality: N/A;   LEFT HEART CATHETERIZATION  WITH CORONARY ANGIOGRAM N/A 06/02/2012   Procedure: LEFT HEART CATHETERIZATION WITH CORONARY ANGIOGRAM;  Surgeon: Donato Schultz, MD;  Location: [facility] CATH LAB;  Service: Cardiovascular;  Laterality: N/A;   PERCUTANEOUS CORONARY STENT INTERVENTION (PCI-S)  05/21/2012   Procedure: PERCUTANEOUS CORONARY STENT INTERVENTION (PCI-S);  Surgeon: Corky Crafts, MD;  Location: [facility] CATH LAB;  Service: Cardiovascular;;   REVERSE SHOULDER ARTHROPLASTY Right 05/23/2020   Procedure: REVERSE SHOULDER ARTHROPLASTY;  Surgeon: Bjorn Pippin, MD;  Location: [facility] ORS;  Service: Orthopedics;  Laterality: Right;   RIGHT/LEFT HEART CATH AND CORONARY ANGIOGRAPHY N/A 12/19/2020   Procedure: RIGHT/LEFT HEART CATH AND CORONARY ANGIOGRAPHY;  Surgeon: Marykay Lex, MD;  Location: [facility] INVASIVE CV LAB;  Service: Cardiovascular;  Laterality: N/A;   ROTATOR CUFF REPAIR Right 2009   TOTAL HIP ARTHROPLASTY Left 12/30/2013   Procedure: LEFT TOTAL HIP ARTHROPLASTY ANTERIOR APPROACH;  Surgeon: Kathryne Hitch, MD;  Location: [facility] ORS;  Service: Orthopedics;  Laterality: Left;   Social History   Occupational History   Not on file  Tobacco Use   Smoking status: Never   Smokeless tobacco: Never  Vaping Use   Vaping Use: Never used  Substance and Sexual Activity   Alcohol use: Yes    Alcohol/week: 34.0 standard drinks    Types: 28 Cans of beer, 2 Shots of liquor, 4 Standard drinks or equivalent per week    Comment: moderate   Drug use: Yes    Types: Marijuana    Comment: marijuana 0.05 grams smoked  per day for joint pain   Sexual activity: Yes    Partners: Female

## 2021-07-11 ENCOUNTER — Other Ambulatory Visit: Payer: Self-pay

## 2021-07-11 ENCOUNTER — Encounter: Payer: Self-pay | Admitting: Physical Medicine and Rehabilitation

## 2021-07-11 ENCOUNTER — Ambulatory Visit: Payer: Self-pay

## 2021-07-11 ENCOUNTER — Ambulatory Visit (INDEPENDENT_AMBULATORY_CARE_PROVIDER_SITE_OTHER): Payer: No Typology Code available for payment source | Admitting: Physical Medicine and Rehabilitation

## 2021-07-11 DIAGNOSIS — M25551 Pain in right hip: Secondary | ICD-10-CM | POA: Diagnosis not present

## 2021-07-11 NOTE — Progress Notes (Signed)
Pt state right hip pain. Pt state walking, standing and laying down makes the pain worse. Pt state he takes over the counter pain makes.   Numeric Pain Rating Scale and Functional Assessment Average Pain 5   In the last MONTH (on 0-10 scale) has pain interfered with the following?  1. General activity like being  able to carry out your everyday physical activities such as walking, climbing stairs, carrying groceries, or moving a chair?  Rating(8)    -BT, -Dye Allergies.

## 2021-07-13 MED ORDER — TRIAMCINOLONE ACETONIDE 40 MG/ML IJ SUSP
60.0000 mg | INTRAMUSCULAR | Status: AC | PRN
  Administered 2021-07-11: 60 mg via INTRA_ARTICULAR

## 2021-07-13 MED ORDER — BUPIVACAINE HCL 0.25 % IJ SOLN
4.0000 mL | INTRAMUSCULAR | Status: AC | PRN
  Administered 2021-07-11: 4 mL via INTRA_ARTICULAR

## 2021-07-13 NOTE — Progress Notes (Signed)
   Gabriel Anderson - 51 y.o. male MRN [id number]  Date of birth: 07-08-1971  Office Visit Note: Visit Date: 07/11/2021 PCP: [name], MD Referred by: [name], MD  Subjective: Chief Complaint  Patient presents with   Right Hip - Pain   HPI:  Gabriel Anderson is a 50 y.o. male who comes in today at the request of Dr. Doneen Poisson for planned Right anesthetic hip arthrogram with fluoroscopic guidance.  The patient has failed conservative care including home exercise, medications, time and activity modification.  This injection will be diagnostic and hopefully therapeutic.  Please see requesting physician notes for further details and justification.  Patient's history significant for prior right total shoulder arthroplasty in 2021 as well as a left total hip arthroplasty in 2015.  Patient has extensive cardiac history.  He is walking with a pretty extensive limp on the right.  ROS Otherwise per HPI.  Assessment & Plan: Visit Diagnoses:    ICD-10-CM   1. Pain in right hip  M25.551 XR C-ARM NO REPORT    Large Joint Inj: R hip joint      Plan: No additional findings.   Meds & Orders: No orders of the defined types were placed in this encounter.   Orders Placed This Encounter  Procedures   Large Joint Inj: R hip joint   XR C-ARM NO REPORT    Follow-up: Return if symptoms worsen or fail to improve.   Procedures: Large Joint Inj: R hip joint on 07/11/2021 9:15 AM Indications: diagnostic evaluation and pain Details: 22 G 3.5 in needle, fluoroscopy-guided anterior approach  Arthrogram: No  Medications: 4 mL bupivacaine 0.25 %; 60 mg triamcinolone acetonide 40 MG/ML Outcome: tolerated well, no immediate complications  There was excellent flow of contrast producing a partial arthrogram of the hip. The patient did only reporting mild relief of symptoms during the anesthetic phase of the injection.  He was basing this on walking around and it seemed to be very  mechanical. Procedure, treatment alternatives, risks and benefits explained, specific risks discussed. Consent was given by the patient. Immediately prior to procedure a time out was called to verify the correct patient, procedure, equipment, support staff and site/side marked as required. Patient was prepped and draped in the usual sterile fashion.         Clinical History: No specialty comments available.     Objective:  VS:  HT:    WT:   BMI:     BP:   HR: bpm  TEMP: ( )  RESP:  Physical Exam   Imaging: No results found.

## 2021-07-15 ENCOUNTER — Ambulatory Visit: Payer: No Typology Code available for payment source | Admitting: Physical Medicine and Rehabilitation

## 2021-08-05 ENCOUNTER — Other Ambulatory Visit: Payer: Self-pay | Admitting: Cardiology

## 2021-08-19 ENCOUNTER — Other Ambulatory Visit (HOSPITAL_COMMUNITY): Payer: No Typology Code available for payment source

## 2021-08-19 ENCOUNTER — Other Ambulatory Visit: Payer: Self-pay

## 2021-08-19 ENCOUNTER — Ambulatory Visit (HOSPITAL_COMMUNITY): Payer: No Typology Code available for payment source | Attending: Cardiology

## 2021-08-19 DIAGNOSIS — I351 Nonrheumatic aortic (valve) insufficiency: Secondary | ICD-10-CM | POA: Diagnosis not present

## 2021-08-19 DIAGNOSIS — I350 Nonrheumatic aortic (valve) stenosis: Secondary | ICD-10-CM | POA: Diagnosis not present

## 2021-08-19 DIAGNOSIS — Z01818 Encounter for other preprocedural examination: Secondary | ICD-10-CM

## 2021-08-19 LAB — ECHOCARDIOGRAM COMPLETE
AR max vel: 0.74 cm2
AV Area VTI: 0.76 cm2
AV Area mean vel: 0.72 cm2
AV Mean grad: 39 mmHg
AV Peak grad: 64.8 mmHg
Ao pk vel: 4.03 m/s
Area-P 1/2: 3.6 cm2
P 1/2 time: 420 msec
S' Lateral: 3.3 cm

## 2021-08-21 ENCOUNTER — Other Ambulatory Visit: Payer: Self-pay

## 2021-08-21 ENCOUNTER — Other Ambulatory Visit: Payer: Self-pay | Admitting: *Deleted

## 2021-08-21 ENCOUNTER — Ambulatory Visit: Payer: No Typology Code available for payment source | Admitting: Surgery

## 2021-08-21 VITALS — BP 114/74 | HR 82 | Resp 20 | Ht 69.0 in | Wt 249.5 lb

## 2021-08-21 DIAGNOSIS — I350 Nonrheumatic aortic (valve) stenosis: Secondary | ICD-10-CM

## 2021-08-21 DIAGNOSIS — I7121 Aneurysm of the ascending aorta, without rupture: Secondary | ICD-10-CM

## 2021-08-24 ENCOUNTER — Encounter: Payer: Self-pay | Admitting: Surgery

## 2021-08-24 NOTE — Progress Notes (Signed)
HPI:  The patient is a 50 year old gentleman with a functionally bicuspid aortic valve and severe aortic stenosis who I initially evaluated on 04/26/2021.  He underwent cardiac catheterization which showed patent stents in the LAD with otherwise mild insignificant narrowing of the LAD proximal and distal to the stented segment.  Surgery was recommended for his severe aortic stenosis but he wanted to wait until his son's high school football season was over.  He had a CTA of the chest on 05/16/2021 which showed a 4.4 cm fusiform ascending aortic aneurysm that appeared to begin just beyond the sinotubular junction and extended up to the distal ascending aorta.  His symptoms have been relatively stable with exertional fatigue and shortness of breath.  Current Outpatient Medications  Medication Sig Dispense Refill   amoxicillin (AMOXIL) 500 MG tablet Take 2,000 mg by mouth See admin instructions. Dental procedures     aspirin 81 MG tablet Take 1 tablet (81 mg total) by mouth daily.     candesartan (ATACAND) 32 MG tablet Take 1 tablet (32 mg total) by mouth daily. 90 tablet 1   nitroGLYCERIN (NITROSTAT) 0.4 MG SL tablet Place 1 tablet (0.4 mg total) under the tongue every 5 (five) minutes as needed. 25 tablet 6   Omega-3 Fatty Acids (FISH OIL) 1000 MG CAPS Take 1,000 mg by mouth daily.     rosuvastatin (CRESTOR) 40 MG tablet TAKE 1 TABLET BY MOUTH EVERY DAY 90 tablet 1   No current facility-administered medications for this visit.     Physical Exam: BP 114/74 (BP Location: Right Arm, Patient Position: Sitting)    Pulse 82    Resp 20    Ht 5\' 9"  (1.753 m)    Wt 249 lb 8 oz (113.2 kg)    SpO2 96%    BMI 36.84 kg/m  He looks well. Cardiac exam shows a regular rate and rhythm with a 3/6 systolic murmur along the right sternal border.  There is no diastolic murmur. Lungs are clear. There is no peripheral edema.  Diagnostic Tests:  Narrative & Impression  CLINICAL DATA:  Follow-up of thoracic  aortic aneurysm. Fatigue. Nonsmoker. Hypertension.   EXAM: CT ANGIOGRAPHY CHEST WITH CONTRAST   TECHNIQUE: Multidetector CT imaging of the chest was performed using the standard protocol during bolus administration of intravenous contrast. Multiplanar CT image reconstructions and MIPs were obtained to evaluate the vascular anatomy.   CONTRAST:  17mL ISOVUE-370 IOPAMIDOL (ISOVUE-370) INJECTION 76%   COMPARISON:  Chest radiograph 12/23/2013. No comparison CT or MRI available.   FINDINGS: Cardiovascular: Aortic atherosclerosis. Normal caliber of the great vessels.   The aorta measures 3.6 cm at the level of the sinuses, 4.4 cm in the mid ascending segment. On 67/5. No dilatation of the transverse or descending segments. Based on coronal reformats, the aorta measures maximally 4.0 cm on 92/10.   No aortic dissection. Normal heart size, without pericardial effusion. Dense lad and right coronary artery calcification.   No central pulmonary embolism, on this non-dedicated study.   Mediastinum/Nodes: No mediastinal or hilar adenopathy.   Lungs/Pleura: No pleural fluid.  Clear lungs.   Upper Abdomen: Normal imaged portions of the liver, spleen, stomach, pancreas, gallbladder, adrenal glands, kidneys.   Musculoskeletal: Right shoulder arthroplasty. Minimal convex right thoracic spine curvature.   Review of the MIP images confirms the above findings.   IMPRESSION: 1. Ascending aortic dilatation, maximally 4.4 cm. Recommend annual imaging followup by CTA or MRA. This recommendation follows 2010 ACCF/AHA/AATS/ACR/ASA/SCA/SCAI/SIR/STS/SVM Guidelines for the  Diagnosis and Management of Patients with Thoracic Aortic Disease. Circulation. 2010; 121: R604-V409. Aortic aneurysm NOS (ICD10-I71.9) 2. Age advanced coronary artery atherosclerosis. Recommend assessment of coronary risk factors and consideration of medical therapy. 3.  Aortic Atherosclerosis (ICD10-I70.0).      Electronically Signed   By: Jeronimo Greaves M.D.   On: 05/16/2021 13:11      Impression:  He has a bicuspid aortic valve with severe aortic stenosis and a 4.4 cm fusiform ascending aortic aneurysm.  The diameter of his distal descending aorta is about 2.2 cm.  Given his young age and bicuspid aortic valve I think the best surgical treatment would be replacement of his aortic valve as well as supra coronary replacement of the ascending aortic aneurysm.  His ascending aorta is 2 times normal size.  I think this will decrease his risk of progressive enlargement and acute aortic dissection.  I would plan to use a mechanical valve given his young age.  He does not have any contraindication to anticoagulation and feels that he could take the Coumadin reliably and maintain adequate follow-up. I discussed the operative procedure with the patient and his wife including alternatives, benefits and risks; including but not limited to bleeding, blood transfusion, infection, stroke, myocardial infarction, graft failure, heart block requiring a permanent pacemaker, organ dysfunction, and death.  Alroy Dust understands and agrees to proceed.    Plan:  He will be scheduled for aortic valve replacement using mechanical valve and supra coronary replacement of the ascending aorta under circulatory arrest on September 16, 2021.  I spent 20 minutes performing this established patient evaluation and > 50% of this time was spent face to face counseling and coordinating the care of this patient's severe bicuspid aortic valve stenosis and aortic aneurysm.   Alleen Borne, MD [facility] [phone]

## 2021-08-26 ENCOUNTER — Encounter: Payer: Self-pay | Admitting: Cardiology

## 2021-08-26 DIAGNOSIS — I7121 Aneurysm of the ascending aorta, without rupture: Secondary | ICD-10-CM

## 2021-08-26 HISTORY — DX: Aneurysm of the ascending aorta, without rupture: I71.21

## 2021-09-11 NOTE — Progress Notes (Addendum)
Surgical Instructions    Your procedure is scheduled on 09/16/21.  Report to [facility] Main Entrance "A" at 5:30 A.M., then check in with the Admitting office.  Call this number if you have problems the morning of surgery:  [phone]   If you have any questions prior to your surgery date call [phone]: Open Monday-Friday 8am-4pm    Remember:  Do not eat or drink after midnight the night before your surgery     Take these medicines the morning of surgery with A SIP OF WATER:  nitroGLYCERIN (NITROSTAT) - if needed    As of today, STOP taking Aleve, Naproxen, Ibuprofen, Motrin, Advil, Goody's, BC's, all herbal medications, fish oil, and all vitamins.   After your COVID test   You are not required to quarantine however you are required to wear a well-fitting mask when you are out and around people not in your household.  If your mask becomes wet or soiled, replace with a new one.  Wash your hands often with soap and water for 20 seconds or clean your hands with an alcohol-based hand sanitizer that contains at least 60% alcohol.  Do not share personal items.  Notify your provider: if you are in close contact with someone who has COVID  or if you develop a fever of 100.4 or greater, sneezing, cough, sore throat, shortness of breath or body aches.           Do not wear jewelry  Do not wear lotions, powders, colognes, or deodorant. Do not shave 48 hours prior to surgery.  Men may shave face and neck. Do not bring valuables to the hospital.              [facility] is not responsible for any belongings or valuables.  Do NOT Smoke (Tobacco/Vaping)  24 hours prior to your procedure  If you use a CPAP at night, you may bring your mask for your overnight stay.   Contacts, glasses, hearing aids, dentures or partials may not be worn into surgery, please bring cases for these belongings   For patients admitted to the hospital, discharge time will be determined by your  treatment team.   Patients discharged the day of surgery will not be allowed to drive home, and someone needs to stay with them for 24 hours.  NO VISITORS WILL BE ALLOWED IN PRE-OP WHERE PATIENTS ARE PREPPED FOR SURGERY.  ONLY 1 SUPPORT PERSON MAY BE PRESENT IN THE WAITING ROOM WHILE YOU ARE IN SURGERY.  IF YOU ARE TO BE ADMITTED, ONCE YOU ARE IN YOUR ROOM YOU WILL BE ALLOWED TWO (2) VISITORS. 1 (ONE) VISITOR MAY STAY OVERNIGHT BUT MUST ARRIVE TO THE ROOM BY 8pm.  Minor children may have two parents present. Special consideration for safety and communication needs will be reviewed on a case by case basis.  Special instructions:    Oral Hygiene is also important to reduce your risk of infection.  Remember - BRUSH YOUR TEETH THE MORNING OF SURGERY WITH YOUR REGULAR TOOTHPASTE   [facility]- Preparing For Surgery  Before surgery, you can play an important role. Because skin is not sterile, your skin needs to be as free of germs as possible. You can reduce the number of germs on your skin by washing with CHG (chlorahexidine gluconate) Soap before surgery.  CHG is an antiseptic cleaner which kills germs and bonds with the skin to continue killing germs even after washing.     Please do not use if  you have an allergy to CHG or antibacterial soaps. If your skin becomes reddened/irritated stop using the CHG.  Do not shave (including legs and underarms) for at least 48 hours prior to first CHG shower. It is OK to shave your face.  Please follow these instructions carefully.     Shower the NIGHT BEFORE SURGERY and the MORNING OF SURGERY with CHG Soap.   If you chose to wash your hair, wash your hair first as usual with your normal shampoo. After you shampoo, rinse your hair and body thoroughly to remove the shampoo.  Then [employer] and genitals (private parts) with your normal soap and rinse thoroughly to remove soap.  After that Use CHG Soap as you would any other liquid soap. You can apply CHG  directly to the skin and wash gently with a scrungie or a clean washcloth.   Apply the CHG Soap to your body ONLY FROM THE NECK DOWN.  Do not use on open wounds or open sores. Avoid contact with your eyes, ears, mouth and genitals (private parts). Wash Face and genitals (private parts)  with your normal soap.   Wash thoroughly, paying special attention to the area where your surgery will be performed.  Thoroughly rinse your body with warm water from the neck down.  DO NOT shower/wash with your normal soap after using and rinsing off the CHG Soap.  Pat yourself dry with a CLEAN TOWEL.  Wear CLEAN PAJAMAS to bed the night before surgery  Place CLEAN SHEETS on your bed the night before your surgery  DO NOT SLEEP WITH PETS.   Day of Surgery:  Take a shower with CHG soap. Wear Clean/Comfortable clothing the morning of surgery Do not apply any deodorants/lotions.   Remember to brush your teeth WITH YOUR REGULAR TOOTHPASTE.   Please read over the following fact sheets that you were given.

## 2021-09-12 ENCOUNTER — Ambulatory Visit (HOSPITAL_COMMUNITY)
Admission: RE | Admit: 2021-09-12 | Discharge: 2021-09-12 | Disposition: A | Discharge status: Home / Self Care | Payer: No Typology Code available for payment source | Source: Ambulatory Visit | Attending: Surgery | Admitting: Surgery

## 2021-09-12 ENCOUNTER — Encounter (HOSPITAL_COMMUNITY)
Admission: RE | Admit: 2021-09-12 | Discharge: 2021-09-12 | Disposition: A | Discharge status: Home / Self Care | Payer: No Typology Code available for payment source | Source: Ambulatory Visit | Attending: Surgery | Admitting: Surgery

## 2021-09-12 ENCOUNTER — Encounter: Payer: Self-pay | Admitting: Surgery

## 2021-09-12 ENCOUNTER — Encounter (HOSPITAL_COMMUNITY): Payer: Self-pay

## 2021-09-12 ENCOUNTER — Other Ambulatory Visit: Payer: Self-pay

## 2021-09-12 VITALS — BP 144/75 | HR 60 | Temp 98.7°F | Resp 17 | Ht 70.0 in | Wt 248.0 lb

## 2021-09-12 DIAGNOSIS — Z01818 Encounter for other preprocedural examination: Secondary | ICD-10-CM | POA: Insufficient documentation

## 2021-09-12 DIAGNOSIS — I10 Essential (primary) hypertension: Secondary | ICD-10-CM | POA: Diagnosis not present

## 2021-09-12 DIAGNOSIS — I2510 Atherosclerotic heart disease of native coronary artery without angina pectoris: Secondary | ICD-10-CM | POA: Insufficient documentation

## 2021-09-12 DIAGNOSIS — I7300 Raynaud's syndrome without gangrene: Secondary | ICD-10-CM | POA: Insufficient documentation

## 2021-09-12 DIAGNOSIS — E785 Hyperlipidemia, unspecified: Secondary | ICD-10-CM | POA: Insufficient documentation

## 2021-09-12 DIAGNOSIS — I252 Old myocardial infarction: Secondary | ICD-10-CM | POA: Diagnosis not present

## 2021-09-12 DIAGNOSIS — I350 Nonrheumatic aortic (valve) stenosis: Secondary | ICD-10-CM | POA: Diagnosis not present

## 2021-09-12 DIAGNOSIS — Z20822 Contact with and (suspected) exposure to covid-19: Secondary | ICD-10-CM | POA: Diagnosis not present

## 2021-09-12 HISTORY — DX: Dyspnea, unspecified: R06.00

## 2021-09-12 LAB — TYPE AND SCREEN
ABO/RH(D): O POS
Antibody Screen: NEGATIVE

## 2021-09-12 LAB — URINALYSIS, ROUTINE W REFLEX MICROSCOPIC
Bilirubin Urine: NEGATIVE
Glucose, UA: NEGATIVE mg/dL
Hgb urine dipstick: NEGATIVE
Ketones, ur: NEGATIVE mg/dL
Leukocytes,Ua: NEGATIVE
Nitrite: NEGATIVE
Protein, ur: NEGATIVE mg/dL
Specific Gravity, Urine: 1.012 (ref 1.005–1.030)
pH: 5 (ref 5.0–8.0)

## 2021-09-12 LAB — CBC
HCT: 46 % (ref 39.0–52.0)
Hemoglobin: 14.9 g/dL (ref 13.0–17.0)
MCH: 29.4 pg (ref 26.0–34.0)
MCHC: 32.4 g/dL (ref 30.0–36.0)
MCV: 90.7 fL (ref 80.0–100.0)
Platelets: 314 10*3/uL (ref 150–400)
RBC: 5.07 MIL/uL (ref 4.22–5.81)
RDW: 12.9 % (ref 11.5–15.5)
WBC: 12.7 10*3/uL — ABNORMAL HIGH (ref 4.0–10.5)
nRBC: 0 % (ref 0.0–0.2)

## 2021-09-12 LAB — COMPREHENSIVE METABOLIC PANEL
ALT: 37 U/L (ref 0–44)
AST: 26 U/L (ref 15–41)
Albumin: 4.1 g/dL (ref 3.5–5.0)
Alkaline Phosphatase: 70 U/L (ref 38–126)
Anion gap: 10 (ref 5–15)
BUN: 12 mg/dL (ref 6–20)
CO2: 22 mmol/L (ref 22–32)
Calcium: 9 mg/dL (ref 8.9–10.3)
Chloride: 104 mmol/L (ref 98–111)
Creatinine, Ser: 0.86 mg/dL (ref 0.61–1.24)
GFR, Estimated: >60 mL/min (ref >60)
Glucose, Bld: 109 mg/dL — ABNORMAL HIGH (ref 70–99)
Potassium: 4.2 mmol/L (ref 3.5–5.1)
Sodium: 136 mmol/L (ref 135–145)
Total Bilirubin: 0.8 mg/dL (ref 0.3–1.2)
Total Protein: 7.1 g/dL (ref 6.5–8.1)

## 2021-09-12 LAB — BLOOD GAS, ARTERIAL
Acid-Base Excess: 1.5 mmol/L (ref 0.0–2.0)
Bicarbonate: 25.5 mmol/L (ref 20.0–28.0)
Drawn by: 60286
FIO2: 21
O2 Saturation: 97.7 %
Patient temperature: 37
pCO2 arterial: 40 mmHg (ref 32.0–48.0)
pH, Arterial: 7.421 (ref 7.350–7.450)
pO2, Arterial: 101 mmHg (ref 83.0–108.0)

## 2021-09-12 LAB — HEMOGLOBIN A1C
Hgb A1c MFr Bld: 5.3 % (ref 4.8–5.6)
Mean Plasma Glucose: 105.41 mg/dL

## 2021-09-12 LAB — SARS CORONAVIRUS 2 (TAT 6-24 HRS): SARS Coronavirus 2: NEGATIVE

## 2021-09-12 LAB — PROTIME-INR
INR: 1 (ref 0.8–1.2)
Prothrombin Time: 13.1 seconds (ref 11.4–15.2)

## 2021-09-12 LAB — APTT: aPTT: 31 seconds (ref 24–36)

## 2021-09-12 LAB — SURGICAL PCR SCREEN
MRSA, PCR: NEGATIVE
Staphylococcus aureus: NEGATIVE

## 2021-09-12 NOTE — Progress Notes (Signed)
Pre-CABG (AVR) Dopplers completed. Refer to "CV Proc" under chart review to view preliminary results.  09/12/2021 10:35 AM Kelby Aline., MHA, RVT, RDCS, RDMS

## 2021-09-12 NOTE — Progress Notes (Signed)
Anesthesia Chart Review:  Case: [id number] Date/Time: 09/16/21 0715   Procedures:      AORTIC VALVE REPLACEMENT (AVR) (Chest) - Circ Arrest     REPLACEMENT ASCENDING AORTA     TRANSESOPHAGEAL ECHOCARDIOGRAM (TEE)   Anesthesia type: General   Pre-op diagnosis: SEVERE AS   Location: [facility] OR ROOM 15 / [place] [facility]   Surgeons: Gaye Pollack, MD       DISCUSSION: Patient is a 51 year old male scheduled for the above procedure.   History includes never smoker, HTN, HLD, CAD (overlapping DES x2 mid LAD 05/21/12 for chest pain and NSVT during aborted stress test; NSTEMI 06/01/12 with widely patent stents, heavily disease septal arcade, medical therapy), ascending thoracic aortic aneurysm (4.4 cm 05/2021), murmur/severe AS, Raynaud's disease/vasospasm, dyspnea, anemia, osteoarthritis (s/p left THA 12/30/13; right reverse total shoulder 05/23/20). BMI is consistent with obesity.    09/12/21 preoperative COVID-19 test negative. Anesthesia team to evaluate on the day of surgery.    VS: BP (!) 144/75    Pulse 60    Temp 37.1 C (Oral)    Resp 17    Ht 5\' 10"  (1.778 m)    Wt 112.5 kg    SpO2 100%    BMI 35.58 kg/m    PROVIDERS: Scarlette Calico, MD is PCP Candee Furbish, MD is cardiologist   LABS: Labs reviewed: Acceptable for surgery. (all labs ordered are listed, but only abnormal results are displayed)  Labs Reviewed  CBC - Abnormal; Notable for the following components:      Result Value   WBC 12.7 (*)    All other components within normal limits  COMPREHENSIVE METABOLIC PANEL - Abnormal; Notable for the following components:   Glucose, Bld 109 (*)    All other components within normal limits  SURGICAL PCR SCREEN  SARS CORONAVIRUS 2 (TAT 6-24 HRS)  APTT  BLOOD GAS, ARTERIAL  HEMOGLOBIN A1C  PROTIME-INR  URINALYSIS, ROUTINE W REFLEX MICROSCOPIC  TYPE AND SCREEN    IMAGES: CXR 09/12/21: FINDINGS: Right shoulder arthroplasty. Midline trachea. Normal heart size. The ascending aortic dilatation on CT  is not readily apparent. Clear lungs. Mild right apical pleural thickening. IMPRESSION: No active cardiopulmonary disease.  CTA Chest 05/16/21: IMPRESSION: 1. Ascending aortic dilatation, maximally 4.4 cm. Recommend annual imaging followup by CTA or MRA. This recommendation follows 2010 ACCF/AHA/AATS/ACR/ASA/SCA/SCAI/SIR/STS/SVM Guidelines for the Diagnosis and Management of Patients with Thoracic Aortic Disease. Circulation. 2010; [id number]. Aortic aneurysm NOS (ICD10-I71.9) 2. Age advanced coronary artery atherosclerosis. Recommend assessment of coronary risk factors and consideration of medical therapy. 3.  Aortic Atherosclerosis (ICD10-I70.0).    EKG: 09/12/21: Sinus bradycardia at 58 bpm Cannot rule out Inferior infarct , age undetermined ST & T wave abnormality, consider lateral ischemia Abnormal ECG When compared with ECG of 14-Jul-2012 11:17, PREVIOUS ECG IS PRESENT ST-t wave abnormality is new Confirmed by Sherren Mocha [phone]) on 09/12/2021 4:29:19 PM   CV: US Carotid 09/12/21: Summary:  Right Carotid: Velocities in the right ICA are consistent with a 1-39%  stenosis.  Left Carotid: Velocities in the left ICA are consistent with a 1-39%  stenosis.  Vertebrals:  Bilateral vertebral arteries demonstrate antegrade flow.  Subclavians: Normal flow hemodynamics were seen in bilateral subclavian arteries.    Echo 08/19/21: IMPRESSIONS   1. There is severe aortic stenosis with AVA 0.75cm2, mean gradient  14mmHg, peak gradient 63mmHg, Vmax 4.81m/s, DI 0.21.   2. Left ventricular ejection fraction, by estimation, is 50 to 55%. The  left ventricle  has low normal function. The left ventricle has no regional  wall motion abnormalities. There is severe hypertrophy of the basal  septum. The rest of the LV segments  demonstrate mild-to-moderate left ventricular hypertrophy. Left  ventricular diastolic parameters are consistent with Grade II diastolic  dysfunction  (pseudonormalization).   3. Right ventricular systolic function is normal. The right ventricular  size is normal.   4. The mitral valve is normal in structure. Trivial mitral valve  regurgitation.   5. The aortic valve is calcified. There is severe calcifcation of the  aortic valve. There is severe thickening of the aortic valve. Aortic valve  regurgitation is mild. Severe aortic valve stenosis.   6. Aortic dilatation noted. There is mild dilatation of the aortic root,  measuring 38 mm. There is borderline dilatation of the ascending aorta,  measuring 36 mm.   7. The inferior vena cava is normal in size with greater than 50%  respiratory variability, suggesting right atrial pressure of 3 mmHg.  - Comparison(s): Compared to prior TTE in 12/14/20, there is now severe AS  with mean gradient 61mmHg, Vmax 4.69m/s, AVA 0.75cm2 (previously 86mmHg AVA  1.06cm2).    RHC/LHC 12/19/20: Prox LAD to Mid LAD lesion is 20% stenosed. Mid LAD lesion is 20% stenosed. LV end diastolic pressure is moderately elevated. There is moderate aortic valve stenosis.   POST-OPERATIVE DIAGNOSIS:   Widely patent LAD stent with maybe 20% prestent stenosis and 20% at the distal edge of the stent.  Otherwise, no significant CAD other than long 30% proximal RCA stenosis. Discrepant cardiac output by Fick and thermodilution: Fick CO-CI 6.87-3; thermodilution 5.61-2.45 Right Heart Cath Pressures:  RA mean 7 mmHg RVP-EDP: 32/2 mmHg-9 mmHg;  PCWP 12 mmHg; PA P 28/6 mmHg-mean 13 mmHg. LVP-EDP: 174/5 mmHg - 22 mmHg; AoP - MAP: 141/73 mmHg-100 mmHg. Aortic valve gradients: P-P 33 mmHg, mean 41.9 mm.  AVA estimated between 0.95 and 1.16 cm -> also discrepant findings, would suggest moderate to severe AS.   RECOMMENDATIONS Discharge home and follow-up with Dr. Marlou Porch.   Based on symptoms, and echocardiographic evidence, he may be at the point where it meets criteria for consideration for valve placement.  Borderline pressure  readings, but AVA estimations are not severe.   Past Medical History:  Diagnosis Date   Acute myocardial infarction, subendocardial infarction, initial episode of care [facility]) 2013   Cardiac catheterization 06/02/12-widely patent previously placed LAD stents. Troponin peaked 3. Normal EF.   Anemia    Arthritis    Ascending aortic aneurysm 08/26/2021   4.4 cm on 05/16/21 CT   Coronary atherosclerosis of native coronary artery 05/21/2012   Cardiac catheterization 05/21/12-95% mid LAD lesion at the bifurcation of a large diagonal branch. Normal ejection fraction.   Dyspnea    Heart murmur    aortic stenosis   Hyperlipidemia    Hypertension    Pneumonia    Raynaud's disease    Vasospasm ([place])     Past Surgical History:  Procedure Laterality Date    cardiacstent x 2  2013   CARDIAC CATHETERIZATION  05/2012   PCI LAD at takeoff of large diagonal   ELBOW SURGERY Left 1984   LEFT HEART CATHETERIZATION WITH CORONARY ANGIOGRAM N/A 05/21/2012   Procedure: LEFT HEART CATHETERIZATION WITH CORONARY ANGIOGRAM;  Surgeon: Jettie Booze, MD;  Location: [facility] CATH LAB;  Service: Cardiovascular;  Laterality: N/A;   LEFT HEART CATHETERIZATION WITH CORONARY ANGIOGRAM N/A 06/02/2012   Procedure: LEFT HEART CATHETERIZATION WITH CORONARY ANGIOGRAM;  Surgeon: Candee Furbish, MD;  Location: [facility] CATH LAB;  Service: Cardiovascular;  Laterality: N/A;   PERCUTANEOUS CORONARY STENT INTERVENTION (PCI-S)  05/21/2012   Procedure: PERCUTANEOUS CORONARY STENT INTERVENTION (PCI-S);  Surgeon: Jettie Booze, MD;  Location: [facility] CATH LAB;  Service: Cardiovascular;;   REVERSE SHOULDER ARTHROPLASTY Right 05/23/2020   Procedure: REVERSE SHOULDER ARTHROPLASTY;  Surgeon: Hiram Gash, MD;  Location: [facility] ORS;  Service: Orthopedics;  Laterality: Right;   RIGHT/LEFT HEART CATH AND CORONARY ANGIOGRAPHY N/A 12/19/2020   Procedure: RIGHT/LEFT HEART CATH AND CORONARY ANGIOGRAPHY;  Surgeon: Leonie Man, MD;  Location: [place] CV  LAB;  Service: Cardiovascular;  Laterality: N/A;   ROTATOR CUFF REPAIR Right 2009   TOTAL HIP ARTHROPLASTY Left 12/30/2013   Procedure: LEFT TOTAL HIP ARTHROPLASTY ANTERIOR APPROACH;  Surgeon: Mcarthur Rossetti, MD;  Location: [facility] ORS;  Service: Orthopedics;  Laterality: Left;    MEDICATIONS:  acetaminophen (TYLENOL) 500 MG tablet   amoxicillin (AMOXIL) 500 MG tablet   aspirin 81 MG tablet   candesartan (ATACAND) 32 MG tablet   nitroGLYCERIN (NITROSTAT) 0.4 MG SL tablet   rosuvastatin (CRESTOR) 40 MG tablet   vitamin B-12 (CYANOCOBALAMIN) 500 MCG tablet   No current facility-administered medications for this encounter.    Myra Gianotti, PA-C Surgical Short Stay/Anesthesiology [facility] Phone [phone] [facility] Phone [phone] 09/12/2021 6:14 PM

## 2021-09-12 NOTE — Progress Notes (Signed)
PCP: Sanda Linger, MD Cardiologist: Donato Schultz, MD  EKG: 09/12/21 CXR: 09/12/21 ECHO: 08/19/21 Stress Test: denies Cardiac Cath: 12/19/20  Fasting Blood Sugar- na Checks Blood Sugar__na_ times a day  ASA: Last dose 09/15/21. Blood Thinner: No  OSA/CPAP: No  Covid test 09/12/21 at PAT  Anesthesia Review: yes, cardiac history  Patient denies shortness of breath, fever, cough, and chest pain at PAT appointment.  Patient verbalized understanding of instructions provided today at the PAT appointment.  Patient asked to review instructions at home and day of surgery.

## 2021-09-12 NOTE — Anesthesia Preprocedure Evaluation (Addendum)
Anesthesia Evaluation  Patient identified by MRN, date of birth, ID band Patient awake    Reviewed: Allergy & Precautions, NPO status , Patient's Chart, lab work & pertinent test results  Airway Mallampati: II  TM Distance: >3 FB     Dental   Pulmonary    breath sounds clear to auscultation       Cardiovascular hypertension, + CAD, + Past MI and + DOE  + Valvular Problems/Murmurs  Rhythm:Regular Rate:Normal     Neuro/Psych    GI/Hepatic negative GI ROS, Neg liver ROS,   Endo/Other  negative endocrine ROS  Renal/GU negative Renal ROS     Musculoskeletal   Abdominal   Peds  Hematology   Anesthesia Other Findings   Reproductive/Obstetrics                           Anesthesia Physical Anesthesia Plan  ASA: 3  Anesthesia Plan: General   Post-op Pain Management:    Induction: Intravenous  PONV Risk Score and Plan: Ondansetron and Midazolam  Airway Management Planned: Oral ETT  Additional Equipment: Arterial line, TEE, PA Cath and Ultrasound Guidance Line Placement  Intra-op Plan:   Post-operative Plan: Post-operative intubation/ventilation  Informed Consent: I have reviewed the patients History and Physical, chart, labs and discussed the procedure including the risks, benefits and alternatives for the proposed anesthesia with the patient or authorized representative who has indicated his/her understanding and acceptance.     Dental advisory given  Plan Discussed with: Anesthesiologist and CRNA  Anesthesia Plan Comments: (PAT note written 09/12/2021 by Myra Gianotti, PA-C. )      Anesthesia Quick Evaluation

## 2021-09-13 MED ORDER — TRANEXAMIC ACID (OHS) BOLUS VIA INFUSION
15.0000 mg/kg | INTRAVENOUS | Status: AC
  Administered 2021-09-16: 1095 mg via INTRAVENOUS
  Filled 2021-09-13: qty 1095

## 2021-09-13 MED ORDER — DEXMEDETOMIDINE HCL IN NACL 400 MCG/100ML IV SOLN
0.1000 ug/kg/h | INTRAVENOUS | Status: AC
  Administered 2021-09-16: 8 ug via INTRAVENOUS
  Administered 2021-09-16: .3 ug/kg/h via INTRAVENOUS
  Filled 2021-09-13: qty 100

## 2021-09-13 MED ORDER — MILRINONE LACTATE IN DEXTROSE 20-5 MG/100ML-% IV SOLN
0.3000 ug/kg/min | INTRAVENOUS | Status: DC
  Filled 2021-09-13: qty 100

## 2021-09-13 MED ORDER — POTASSIUM CHLORIDE 2 MEQ/ML IV SOLN
80.0000 meq | INTRAVENOUS | Status: DC
  Filled 2021-09-13: qty 40

## 2021-09-13 MED ORDER — EPINEPHRINE HCL 5 MG/250ML IV SOLN IN NS
0.0000 ug/min | INTRAVENOUS | Status: DC
  Filled 2021-09-13: qty 250

## 2021-09-13 MED ORDER — CEFAZOLIN SODIUM-DEXTROSE 2-4 GM/100ML-% IV SOLN
2.0000 g | INTRAVENOUS | Status: DC
  Filled 2021-09-13: qty 100

## 2021-09-13 MED ORDER — PLASMA-LYTE A IV SOLN
INTRAVENOUS | Status: DC
  Filled 2021-09-13: qty 5

## 2021-09-13 MED ORDER — PHENYLEPHRINE HCL-NACL 20-0.9 MG/250ML-% IV SOLN
30.0000 ug/min | INTRAVENOUS | Status: AC
  Administered 2021-09-16: 20 ug/min via INTRAVENOUS
  Filled 2021-09-13: qty 250

## 2021-09-13 MED ORDER — CEFAZOLIN SODIUM-DEXTROSE 2-4 GM/100ML-% IV SOLN
2.0000 g | INTRAVENOUS | Status: AC
  Administered 2021-09-16 (×2): 2 g via INTRAVENOUS
  Filled 2021-09-13: qty 100

## 2021-09-13 MED ORDER — TRANEXAMIC ACID (OHS) PUMP PRIME SOLUTION
2.0000 mg/kg | INTRAVENOUS | Status: DC
  Filled 2021-09-13: qty 2.25

## 2021-09-13 MED ORDER — INSULIN REGULAR(HUMAN) IN NACL 100-0.9 UT/100ML-% IV SOLN
INTRAVENOUS | Status: AC
  Administered 2021-09-16: 1.8 [IU]/h via INTRAVENOUS
  Filled 2021-09-13: qty 100

## 2021-09-13 MED ORDER — MANNITOL 20 % IV SOLN
INTRAVENOUS | Status: DC
  Filled 2021-09-13: qty 13

## 2021-09-13 MED ORDER — NITROGLYCERIN IN D5W 200-5 MCG/ML-% IV SOLN
2.0000 ug/min | INTRAVENOUS | Status: AC
  Administered 2021-09-16: 5 ug/min via INTRAVENOUS
  Filled 2021-09-13: qty 250

## 2021-09-13 MED ORDER — VANCOMYCIN HCL 1500 MG/300ML IV SOLN
1500.0000 mg | INTRAVENOUS | Status: AC
  Administered 2021-09-16: 1500 mg via INTRAVENOUS
  Filled 2021-09-13: qty 300

## 2021-09-13 MED ORDER — NOREPINEPHRINE 4 MG/250ML-% IV SOLN
0.0000 ug/min | INTRAVENOUS | Status: DC
  Filled 2021-09-13: qty 250

## 2021-09-13 MED ORDER — TRANEXAMIC ACID 1000 MG/10ML IV SOLN
1.5000 mg/kg/h | INTRAVENOUS | Status: AC
  Administered 2021-09-16: 1.5 mg/kg/h via INTRAVENOUS
  Filled 2021-09-13: qty 25

## 2021-09-13 MED ORDER — HEPARIN 30,000 UNITS/1000 ML (OHS) CELLSAVER SOLUTION
Status: DC
  Filled 2021-09-13: qty 1000

## 2021-09-15 NOTE — H&P (Signed)
[address]       [place] [address]             [phone]      Cardiothoracic Surgery Admission History and Physical   PCP is [name], MD  Referring Provider is Jake Bathe, MD      Chief Complaint  Patient presents with   Aortic Stenosis       HPI:  The patient is a 51 year old gentleman with history of hypertension, hyperlipidemia, osteoarthritis, coronary disease status post MI and PCI with LAD stenting x2 in 2013 as well as aortic stenosis that has been followed with periodic echocardiograms. A 2D echo in February 2020 showed a severely calcified trileaflet aortic valve with a mean gradient of 39 mmHg and a valve area of 1.0 cm. Left ventricular ejection fraction was 55%. His mean gradient 6 months later was measured at 28 mmHg. Then in June 2021 his aortic valve mean gradient was measured at 34 mmHg. His most recent echo on 12/14/2020 showed a mean gradient of 35.3 mmHg with a peak gradient of 60 mmHg. Aortic valve area by VTI was 1.06 cm. Dimensionless index was 0.22. There was mild aortic insufficiency with a pressure half-time of 376 ms. Aortic root diameter was measured at 3.6 cm with an ascending aortic diameter 4.0 cm. Left ventricular ejection fraction was 50 to 55% with mild LVH and grade 1 diastolic dysfunction. His most recent echo on 08/19/21 showed severe AS with a mean gradient of 39, peak of 67, DI of 0.21 and AVA of 0.75 cm2. There was mild AI with a pressure half time of 420 msec.  LVEF was 50-55% with severe hypertrophy of the basal septum and otherwise mild to moderate LVH with grade ll diastolic dysfunction.  He reports having problems over the past 2 years with exertional shortness of breath and fatigue which has progressed over the past 6 months. 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 He frequently has extreme fatigue in his legs and has to stop. He said that he recently had  to walk from his car into the [facility] and had to stop a few times to rest which is very abnormal for him. He has also noticed some swelling in his ankles recently. He denies any dizziness or syncope. He has had no chest pain or pressure.   He reports having to have a couple teeth extracted before shoulder surgery last September. He does see a dentist regularly and was just there on 03/22/2021.   Surgery was recommended for his severe aortic stenosis but he wanted to wait until his son's high school football season was over.  He had a CTA of the chest on 05/16/2021 which showed a 4.4 cm fusiform ascending aortic aneurysm that appeared to begin just beyond the sinotubular junction and extended up to the distal ascending aorta.  His symptoms have been relatively stable with exertional fatigue and shortness of breath.     Past Medical History:  Diagnosis Date   Acute myocardial infarction, subendocardial infarction, initial episode of care [facility]) 2013   Cardiac catheterization 06/02/12-widely patent previously placed LAD stents. Troponin peaked 3. Normal EF.   Anemia    Arthritis    Coronary atherosclerosis of native coronary artery 05/21/2012   Cardiac catheterization 05/21/12-95% mid LAD lesion at the bifurcation of a large diagonal branch. Normal ejection fraction.   Heart  murmur    Hyperlipidemia    Hypertension    Pneumonia    Raynaud's disease    Vasospasm ([place])    takes imdur for        Past Surgical History:  Procedure Laterality Date   cardiacstent x 2  2013   CARDIAC CATHETERIZATION  05/2012   PCI LAD at takeoff of large diagonal   ELBOW SURGERY Left 1984   LEFT HEART CATHETERIZATION WITH CORONARY ANGIOGRAM N/A 05/21/2012   Procedure: LEFT HEART CATHETERIZATION WITH CORONARY ANGIOGRAM; Surgeon: Jettie Booze, MD; Location: [facility] CATH LAB; Service: Cardiovascular; Laterality: N/A;   LEFT HEART CATHETERIZATION WITH CORONARY ANGIOGRAM N/A 06/02/2012   Procedure: LEFT HEART  CATHETERIZATION WITH CORONARY ANGIOGRAM; Surgeon: Candee Furbish, MD; Location: [facility] CATH LAB; Service: Cardiovascular; Laterality: N/A;   PERCUTANEOUS CORONARY STENT INTERVENTION (PCI-S)  05/21/2012   Procedure: PERCUTANEOUS CORONARY STENT INTERVENTION (PCI-S); Surgeon: Jettie Booze, MD; Location: [facility] CATH LAB; Service: Cardiovascular;;   REVERSE SHOULDER ARTHROPLASTY Right 05/23/2020   Procedure: REVERSE SHOULDER ARTHROPLASTY; Surgeon: Hiram Gash, MD; Location: [facility] ORS; Service: Orthopedics; Laterality: Right;   RIGHT/LEFT HEART CATH AND CORONARY ANGIOGRAPHY N/A 12/19/2020   Procedure: RIGHT/LEFT HEART CATH AND CORONARY ANGIOGRAPHY; Surgeon: Leonie Man, MD; Location: [place] CV LAB; Service: Cardiovascular; Laterality: N/A;   ROTATOR CUFF REPAIR Right 2009   TOTAL HIP ARTHROPLASTY Left 12/30/2013   Procedure: LEFT TOTAL HIP ARTHROPLASTY ANTERIOR APPROACH; Surgeon: Mcarthur Rossetti, MD; Location: [facility] ORS; Service: Orthopedics; Laterality: Left;        Family History  Problem Relation Age of Onset   Hypertension Father    Diabetes Father    Heart failure Father    Heart disease Father    Stroke Sister    COPD Mother    Social History  Social History        Tobacco Use   Smoking status: Never   Smokeless tobacco: Never  Vaping Use   Vaping Use: Never used  Substance Use Topics   Alcohol use: Yes    Alcohol/week: 34.0 standard drinks    Types: 28 Cans of beer, 2 Shots of liquor, 4 Standard drinks or equivalent per week    Comment: moderate   Drug use: Yes    Types: Marijuana    Comment: marijuana 0.05 grams smoked per day for joint pain         Current Outpatient Medications  Medication Sig Dispense Refill   amoxicillin (AMOXIL) 500 MG tablet Take 2,000 mg by mouth See admin instructions. Dental procedures     aspirin 81 MG tablet Take 1 tablet (81 mg total) by mouth daily.     candesartan (ATACAND) 32 MG tablet Take 1 tablet (32 mg total) by mouth daily. 90  tablet 1   Cholecalciferol (VITAMIN D3) 50 MCG (2000 UT) TABS TAKE 1 TABLET BY MOUTH EVERY DAY (Patient taking differently: Take 2,000 Units by mouth daily.) 100 tablet 1   Coenzyme Q10 100 MG capsule Take 100 mg by mouth daily.     furosemide (LASIX) 20 MG tablet Take 1 tablet (20 mg total) by mouth as directed. Please schedule appointment for October 2022 for future refills. Thank you 30 tablet 1   nitroGLYCERIN (NITROSTAT) 0.4 MG SL tablet Place 1 tablet (0.4 mg total) under the tongue every 5 (five) minutes as needed. (Patient not taking: Reported on 12/14/2020) 25 tablet 6   Omega-3 Fatty Acids (FISH OIL) 1000 MG CAPS Take 1,000 mg by mouth daily.  rosuvastatin (CRESTOR) 40 MG tablet TAKE 1 TABLET BY MOUTH EVERY DAY 90 tablet 1   scopolamine (TRANSDERM-SCOP) 1 MG/3DAYS Place 1 patch (1.5 mg total) onto the skin every 3 (three) days. (Patient not taking: Reported on 12/14/2020) 4 patch 0   No current facility-administered medications for this visit.   No Known Allergies  Review of Systems  Constitutional: Positive for activity change and fatigue.  HENT: Negative for dental problem.  Eyes: Negative.  Respiratory: Positive for shortness of breath.  Cardiovascular: Positive for palpitations and leg swelling. Negative for chest pain.  Gastrointestinal:  Hiatal hernia  Endocrine: Negative.  Genitourinary: Negative.  Musculoskeletal: Positive for arthralgias, joint swelling and myalgias.  Difficulty walking  Skin: Positive for rash.  Allergic/Immunologic: Negative.  Neurological: Negative for dizziness and syncope.  Hematological: Negative.  Psychiatric/Behavioral: Negative.  There were no vitals taken for this visit.  Physical Exam  Constitutional:  Appearance: Normal appearance.  HENT:  Head: Normocephalic and atraumatic.  Eyes:  Extraocular Movements: Extraocular movements intact.  Pupils: Pupils are equal, round, and reactive to light.  Neck:  Vascular: No carotid bruit.   Cardiovascular:  Rate and Rhythm: Normal rate and regular rhythm.  Pulses: Normal pulses.  Heart sounds: Murmur heard.  Comments: 3/6 systolic murmur along the right sternal border. There is no diastolic murmur.  Pulmonary:  Effort: Pulmonary effort is normal.  Breath sounds: Normal breath sounds.  Abdominal:  Tenderness: There is no abdominal tenderness.  Musculoskeletal:  General: No swelling.  Cervical back: Normal range of motion and neck supple.  Skin:  General: Skin is warm and dry.  Neurological:  General: No focal deficit present.  Mental Status: He is alert and oriented to person, place, and time.  Psychiatric:  Mood and Affect: Mood normal.  Behavior: Behavior normal.   Diagnostic Tests:  ECHOCARDIOGRAM REPORT     Patient Name: Rondale T Wilhoite Date of Exam: 12/14/2020  Medical Rec #: [id number] Height: 70.0 in  Accession #: [id number] Weight: 240.0 lb  Date of Birth: [birth year] BSA: 2.255 m  Patient Age: 55 years BP: 142/70 mmHg  Patient Gender: M HR: 58 bpm.  Exam Location: [facility]   Procedure: 2D Echo, Cardiac Doppler and Color Doppler   Indications: I35.0 AS   History: Patient has prior history of Echocardiogram examinations,  most  recent 02/15/2020. CAD, ASH, AS, Signs/Symptoms:Dilated  aortic  root; Risk Factors:Obesity.   Sonographer: Coralyn Helling RDCS  Referring Phys: [place]    1. Left ventricular ejection fraction, by estimation, is 50 to 55%. The  left ventricle has low normal function. The left ventricle has no regional  wall motion abnormalities. There is mild left ventricular hypertrophy.  Left ventricular diastolic  parameters are consistent with Grade I diastolic dysfunction (impaired  relaxation). Elevated left atrial pressure.  2. Right ventricular systolic function is normal. The right ventricular  size is normal.  3. The mitral valve is normal in structure. Trivial mitral valve  regurgitation. No  evidence of mitral stenosis.  4. The aortic valve has an indeterminant number of cusps. Aortic valve  regurgitation is mild. Moderate to severe aortic valve stenosis.  5. Aortic dilatation noted. There is mild dilatation of the ascending  aorta, measuring 40 mm.  6. The inferior vena cava is normal in size with greater than 50%  respiratory variability, suggesting right atrial pressure of 3 mmHg.   FINDINGS  Left Ventricle: Left ventricular ejection fraction, by estimation, is 50  to  55%. The left ventricle has low normal function. The left ventricle has  no regional wall motion abnormalities. The left ventricular internal  cavity size was normal in size.  There is mild left ventricular hypertrophy. Left ventricular diastolic  parameters are consistent with Grade I diastolic dysfunction (impaired  relaxation). Elevated left atrial pressure.   Right Ventricle: The right ventricular size is normal.Right ventricular  systolic function is normal.   Left Atrium: Left atrial size was normal in size.   Right Atrium: Right atrial size was normal in size.   Pericardium: There is no evidence of pericardial effusion.   Mitral Valve: The mitral valve is normal in structure. Trivial mitral  valve regurgitation. No evidence of mitral valve stenosis.   Tricuspid Valve: The tricuspid valve is normal in structure. Tricuspid  valve regurgitation is trivial. No evidence of tricuspid stenosis.   Aortic Valve: The aortic valve has an indeterminant number of cusps.  Aortic valve regurgitation is mild. Aortic regurgitation PHT measures 376  msec. Moderate to severe aortic stenosis is present. Aortic valve mean  gradient measures 35.3 mmHg. Aortic  valve peak gradient measures 60.0 mmHg. Aortic valve area, by VTI measures  1.06 cm.   Pulmonic Valve: The pulmonic valve was normal in structure. Pulmonic valve  regurgitation is not visualized. No evidence of pulmonic stenosis.   Aorta: Aortic  dilatation noted. There is mild dilatation of the ascending  aorta, measuring 40 mm.   Venous: The inferior vena cava is normal in size with greater than 50%  respiratory variability, suggesting right atrial pressure of 3 mmHg.   IAS/Shunts: No atrial level shunt detected by color flow Doppler.    LEFT VENTRICLE  PLAX 2D  LVIDd: 4.60 cm Diastology  LVIDs: 3.30 cm LV e' medial: 5.44 cm/s  LV PW: 1.40 cm LV E/e' medial: 18.9  LV IVS: 1.30 cm LV e' lateral: 6.64 cm/s  LVOT diam: 2.50 cm LV E/e' lateral: 15.5  LV SV: 102  LV SV Index: 45  LVOT Area: 4.91 cm    RIGHT VENTRICLE IVC  RV S prime: 10.20 cm/s IVC diam: 1.40 cm  TAPSE (M-mode): 2.1 cm   LEFT ATRIUM Index RIGHT ATRIUM Index  LA diam: 4.10 cm 1.82 cm/m RA Pressure: 3.00 mmHg  LA Vol (A2C): 78.3 ml 34.72 ml/m RA Area: 17.30 cm  LA Vol (A4C): 57.4 ml 25.45 ml/m RA Volume: 50.60 ml 22.43 ml/m  LA Biplane Vol: 71.4 ml 31.66 ml/m  AORTIC VALVE  AV Area (Vmax): 1.05 cm  AV Area (Vmean): 1.07 cm  AV Area (VTI): 1.06 cm  AV Vmax: 387.33 cm/s  AV Vmean: 278.667 cm/s  AV VTI: 0.960 m  AV Peak Grad: 60.0 mmHg  AV Mean Grad: 35.3 mmHg  LVOT Vmax: 82.50 cm/s  LVOT Vmean: 60.800 cm/s  LVOT VTI: 0.207 m  LVOT/AV VTI ratio: 0.22  AI PHT: 376 msec   AORTA  Ao Root diam: 3.60 cm  Ao Asc diam: 4.00 cm   MV E velocity: 103.00 cm/s TRICUSPID VALVE  MV A velocity: 111.00 cm/s Estimated RAP: 3.00 mmHg  MV E/A ratio: 0.93  SHUNTS  Systemic VTI: 0.21 m  Systemic Diam: 2.50 cm   Kirk Ruths MD  Electronically signed by Kirk Ruths MD  Signature Date/Time: 12/14/2020/10:41:01 AM     Final  Physicians  Panel Physicians Referring Physician Case Authorizing Physician  Leonie Man, MD (Primary)    Procedures  RIGHT/LEFT HEART CATH AND CORONARY ANGIOGRAPHY  Conclusion  Prox LAD  to Mid LAD lesion is 20% stenosed.  Mid LAD lesion is 20% stenosed.  LV end diastolic pressure is moderately elevated.  There is  moderate aortic valve stenosis. POST-OPERATIVE DIAGNOSIS:  Widely patent LAD stent with maybe 20% prestent stenosis and 20% at the distal edge of the stent. Otherwise, no significant CAD other than long 30% proximal RCA stenosis. Discrepant cardiac output by Fick and thermodilution: Fick CO-CI 6.87-3; thermodilution 5.61-2.45 Right Heart Cath Pressures:  RA mean 7 mmHg RVP-EDP: 32/2 mmHg-9 mmHg;  PCWP 12 mmHg; PA P 28/6 mmHg-mean 13 mmHg.  LVP-EDP: 174/5 mmHg - 22 mmHg; AoP - MAP: 141/73 mmHg-100 mmHg. Aortic valve gradients: P-P 33 mmHg, mean 41.9 mm. AVA estimated between 0.95 and 1.16 cm -> also discrepant findings, would suggest moderate to severe AS. RECOMMENDATIONS  Discharge home and follow-up with Dr. Marlou Porch.  Based on symptoms, and echocardiographic evidence, he may be at the point where it meets criteria for consideration for valve placement. Borderline pressure readings, but AVA estimations are not severe. Results discussed with primary cardiologist.  Glenetta Hew, MD  Recommendations  Antiplatelet/Anticoag Recommend Aspirin $RemoveBeforeDEI'81mg'fDolvTnmfrkFdlym$  daily for moderate CAD. Existing stent and aortic stenosis  Discharge Date In the absence of any other complications or medical issues, we expect the patient to be ready for discharge from a cath perspective on 12/19/2020.  Surgeon Notes    12/19/2020 8:43 PM Brief Op Note addendum by Leonie Man, MD  Indications  Moderate to severe aortic stenosis [I35.0 (ICD-10-CM)]  Dyspnea on exertion [R06.00 (ICD-10-CM)]  CAD S/P percutaneous coronary angioplasty [I25.10, Z98.61 (ICD-10-CM)]  Procedural Details  Technical Details Primary Care Provider: Janith Lima, MD Cardiologist: Candee Furbish, MD  PATIENT: Gabriel Anderson 51 y.o. male with history of LAD PCI in 2014, and now progressive aortic stenosis. Most recent echocardiogram read stenosis is moderate with a mean gradient of ~35 to 37 mmHg. He has had progressive dyspnea on exertion. Concerned this  could be possibly related to his aortic valve or CAD, he is referred for right left heart catheterization.  PRE-OPERATIVE DIAGNOSIS: shortness of breath; moderate to severe aortic stenosis, coronary disease status post PCI  Procedure: Time Out: Verified patient identification, verified procedure, site/side was marked, verified correct patient position, special equipment/implants available, medications/allergies/relevent history reviewed, required imaging and test results available. Performed.  Access:   RIGHT radial Artery: 6 Fr sheath -- Seldinger technique using Micropuncture Kit  Direct ultrasound guidance used. Permanent image obtained and placed on chart.  10 mL radial cocktail IA; 5500 units IV Heparin  * Right Brachial/Antecubital Vein: The existing 18-gauge IV was exchanged over a wire for a 7 French sheath  Right Heart Catheterization: 7 Fr Swan Ganz catheter advanced under fluoroscopy with balloon inflated to the RA, RV, then PCWP-PA for hemodynamic measurement.   * Simultaneous FA & PA blood gases checked for SaO2% to calculate FICK CO/CI   * Thermodilution Injections performed to calculate CO/CI   * Catheter removed completely out of the body with balloon deflated.   Left Heart Catheterization: 5 Fr Catheters advanced or exchanged over a J-wire under direct fluoroscopic guidance into the ascending aorta; TIG 4.0 catheter advanced first.   * LV Hemodynamics (LV Gram): JR4 catheter (used straight wire)  * Left Coronary Artery Cineangiography: TIG 4.0 catheter   * Right Coronary Artery Cineangiography: JR4 catheter catheter   Upon completion of Angiogaphy, the catheter was removed completely out of the body over a wire, without complication.  Brachial Sheath(s) removed in  the Cath Lab with manual pressure for hemostasis.   Radial sheath removed in the Cardiac Catheterization lab with TR Band placed for hemostasis.  TR Band: 0 925 Hours; 13 mL air  MEDICATIONS * SQ  Lidocaine 70mL * Radial Cocktail: 3 mg Verapmil in 10 mL NS * Isovue Contrast: 50 * Heparin: 5500 units Estimated blood loss <50 mL.   During this procedure medications were administered to achieve and maintain moderate conscious sedation while the patient's heart rate, blood pressure, and oxygen saturation were continuously monitored and I was present face-to-face 100% of this time.  Medications  (Filter: Administrations occurring from 0824 to 0928 on 12/19/20)  important Continuous medications are totaled by the amount administered until 12/19/20 0928.  midazolam (VERSED) injection (mg)  Total dose: 2 mg  Date/Time Rate/Dose/Volume Action   12/19/20 0841 1 mg Given   0851 1 mg Given   fentaNYL (SUBLIMAZE) injection (mcg)  Total dose: 50 mcg  Date/Time Rate/Dose/Volume Action   12/19/20 0842 25 mcg Given   0851 25 mcg Given   lidocaine (PF) (XYLOCAINE) 1 % injection (mL)  Total volume: 7 mL  Date/Time Rate/Dose/Volume Action   12/19/20 0851 2 mL Given   0852 5 mL Given   Radial Cocktail/Verapamil only (mL)  Total volume: 10 mL  Date/Time Rate/Dose/Volume Action   12/19/20 0855 10 mL Given   heparin sodium (porcine) injection (Units)  Total dose: 5,500 Units  Date/Time Rate/Dose/Volume Action   12/19/20 0907 5,500 Units Given   iohexol (OMNIPAQUE) 350 MG/ML injection (mL)  Total volume: 50 mL  Date/Time Rate/Dose/Volume Action   12/19/20 0925 50 mL Given   Heparin (Porcine) in NaCl 1000-0.9 UT/500ML-% SOLN (mL)  Total volume: 1,000 mL  Date/Time Rate/Dose/Volume Action   12/19/20 0835 500 mL Given   0835 500 mL Given   Sedation Time  Sedation Time Physician-1: 40 minutes 32 seconds  Radiation/Fluoro  Fluoro time: 9.2 (min)  DAP: 24268 (mGycm2)  Cumulative Air Kerma: 341 (mGy)  Complications     Complications documented before study signed (12/19/2020 9:62 PM)    No complications were associated with this study.   Documented by Leonie Man, MD - 12/19/2020 8:47 PM    Coronary Findings  Diagnostic  Dominance: Right  Left Main  Vessel is large.  Left Anterior Descending  Prox LAD to Mid LAD lesion is 20% stenosed. The lesion is focal and eccentric.  Mid LAD lesion is 20% stenosed. The lesion was previously treated using a drug eluting stent over 2 years ago. Previously placed stent displays no restenosis.  First Diagonal Branch  Vessel is moderate in size.  First Septal Branch  Vessel is small in size.  Left Circumflex  Vessel is large.  First Obtuse Marginal Branch  Vessel is small in size.  Left Posterior Atrioventricular Artery  Vessel is small in size.  Right Coronary Artery  Second Right Posterolateral Branch  Vessel is small in size.  Intervention  No interventions have been documented.  Right Heart  Right Heart Pressures PAP 28/6 mmHg-mean 13 mmHg. PCWP 12 mmHg Elevated LV EDP consistent with volume overload. LVP-EDP: 174/5 mmHg - 22 mmHg; AoP - MAP: 141/73 mmHg-100 mmHg. PA sat 75%. AO sat 99% Discrepant Cardiac Output-index by Fick and thermodilution: Fick CO-CI 6.87-3; thermodilution 5.61-2.45  Right Atrium Right atrial pressure is normal. RA mean 7 mmHg  Right Ventricle RVP-EDP: 32/2 mmHg-9 mmHg  Wall Motion  Resting    No left ventriculography    Left Heart  Left Ventricle LV end diastolic pressure is moderately elevated.  Aortic Valve There is moderate aortic valve stenosis. Somewhat discrepant numbers with the peak gradient being lower than the mean gradient. The gradients would suggest moderate to severe (closer to severe) aortic stenosis, however the valve was crossed relatively easily. The aortic valve is calcified. Aortic valve gradients: P-P 33 mmHg & mean 41.9 mm. AVA estimated between 0.95 and 1.16  Coronary Diagrams  Diagnostic  Dominance: Right  Intervention  Implants  No implant documentation for this case.   Syngo Images  Show images for CARDIAC CATHETERIZATION  Images on Long Term Storage  Show images for  Vikrant, Pryce "[name]" Link to Procedure Log    Procedure Log  Hemo Data  Flowsheet Row Most Recent Value  Fick Cardiac Output 6.87 L/min  Fick Cardiac Output Index 3 (L/min)/BSA  Thermal Cardiac Output 5.61 L/min  Thermal Cardiac Output Index 2.45 (L/min)/BSA  Aortic Mean Gradient 41.88 mmHg  Aortic Peak Gradient 33 mmHg  Aortic Valve Area 0.95  Aortic Value Area Index 0.41 cm2/BSA  RA A Wave 10 mmHg  RA V Wave 6 mmHg  RA Mean 4 mmHg  RV Systolic Pressure 33 mmHg  RV Diastolic Pressure 3 mmHg  RV EDP 9 mmHg  PA Systolic Pressure 28 mmHg  PA Diastolic Pressure 6 mmHg  PA Mean 13 mmHg  PW A Wave 16 mmHg  PW V Wave 18 mmHg  PW Mean 15 mmHg  AO Systolic Pressure 676 mmHg  AO Diastolic Pressure 72 mmHg  AO Mean 98 mmHg  LV Systolic Pressure 195 mmHg  LV Diastolic Pressure 5 mmHg  LV EDP 22 mmHg  AOp Systolic Pressure 093 mmHg  AOp Diastolic Pressure 73 mmHg  AOp Mean Pressure 267 mmHg  LVp Systolic Pressure 124 mmHg  LVp Diastolic Pressure 5 mmHg  LVp EDP Pressure 20 mmHg  TPVR Index 5.31 HRUI  TSVR Index 40.02 HRUI  TPVR/TSVR Ratio 0.13    Narrative & Impression  CLINICAL DATA:  Follow-up of thoracic aortic aneurysm. Fatigue. Nonsmoker. Hypertension.   EXAM: CT ANGIOGRAPHY CHEST WITH CONTRAST   TECHNIQUE: Multidetector CT imaging of the chest was performed using the standard protocol during bolus administration of intravenous contrast. Multiplanar CT image reconstructions and MIPs were obtained to evaluate the vascular anatomy.   CONTRAST:  60mL ISOVUE-370 IOPAMIDOL (ISOVUE-370) INJECTION 76%   COMPARISON:  Chest radiograph 12/23/2013. No comparison CT or MRI available.   FINDINGS: Cardiovascular: Aortic atherosclerosis. Normal caliber of the great vessels.   The aorta measures 3.6 cm at the level of the sinuses, 4.4 cm in the mid ascending segment. On 67/5. No dilatation of the transverse or descending segments. Based on coronal reformats, the aorta  measures maximally 4.0 cm on 92/10.   No aortic dissection. Normal heart size, without pericardial effusion. Dense lad and right coronary artery calcification.   No central pulmonary embolism, on this non-dedicated study.   Mediastinum/Nodes: No mediastinal or hilar adenopathy.   Lungs/Pleura: No pleural fluid.  Clear lungs.   Upper Abdomen: Normal imaged portions of the liver, spleen, stomach, pancreas, gallbladder, adrenal glands, kidneys.   Musculoskeletal: Right shoulder arthroplasty. Minimal convex right thoracic spine curvature.   Review of the MIP images confirms the above findings.   IMPRESSION: 1. Ascending aortic dilatation, maximally 4.4 cm. Recommend annual imaging followup by CTA or MRA. This recommendation follows 2010 ACCF/AHA/AATS/ACR/ASA/SCA/SCAI/SIR/STS/SVM Guidelines for the Diagnosis and Management of Patients with Thoracic Aortic Disease. Circulation. 2010; 121: P809-X833. Aortic aneurysm NOS (  ICD10-I71.9) 2. Age advanced coronary artery atherosclerosis. Recommend assessment of coronary risk factors and consideration of medical therapy. 3.  Aortic Atherosclerosis (ICD10-I70.0).     Electronically Signed   By: Abigail Miyamoto M.D.   On: 05/16/2021 13:11      Impression:   He has a bicuspid aortic valve with severe aortic stenosis and a 4.4 cm fusiform ascending aortic aneurysm.  The diameter of his distal descending aorta is about 2.2 cm.  Given his young age and bicuspid aortic valve I think the best surgical treatment would be replacement of his aortic valve as well as supra coronary replacement of the ascending aortic aneurysm.  His ascending aorta is 2 times normal size.  I think this will decrease his risk of progressive enlargement and acute aortic dissection.  I would plan to use a mechanical valve given his young age.  He does not have any contraindication to anticoagulation and feels that he could take the Coumadin reliably and maintain adequate  follow-up. I discussed the operative procedure with the patient and his wife including alternatives, benefits and risks; including but not limited to bleeding, blood transfusion, infection, stroke, myocardial infarction, graft failure, heart block requiring a permanent pacemaker, organ dysfunction, and death.  Elane Fritz understands and agrees to proceed.     Plan:   He will be scheduled for aortic valve replacement using mechanical valve and supra coronary replacement of the ascending aorta under circulatory arrest on September 16, 2021.

## 2021-09-16 ENCOUNTER — Encounter (HOSPITAL_COMMUNITY)
Admission: RE | Disposition: A | Discharge status: Home / Self Care | Payer: Self-pay | Source: Home / Self Care | Attending: Surgery

## 2021-09-16 ENCOUNTER — Inpatient Hospital Stay (HOSPITAL_COMMUNITY)
Admission: RE | Admit: 2021-09-16 | Discharge: 2021-09-21 | DRG: 220 | Disposition: A | Discharge status: Home / Self Care | Payer: No Typology Code available for payment source | Attending: Surgery | Admitting: Surgery

## 2021-09-16 ENCOUNTER — Inpatient Hospital Stay (HOSPITAL_COMMUNITY): Payer: No Typology Code available for payment source | Admitting: Anesthesiology

## 2021-09-16 ENCOUNTER — Inpatient Hospital Stay (HOSPITAL_COMMUNITY): Payer: No Typology Code available for payment source

## 2021-09-16 ENCOUNTER — Inpatient Hospital Stay (HOSPITAL_COMMUNITY): Payer: No Typology Code available for payment source | Admitting: Vascular Surgery

## 2021-09-16 ENCOUNTER — Encounter (HOSPITAL_COMMUNITY): Payer: Self-pay | Admitting: Surgery

## 2021-09-16 ENCOUNTER — Other Ambulatory Visit: Payer: Self-pay

## 2021-09-16 DIAGNOSIS — E785 Hyperlipidemia, unspecified: Secondary | ICD-10-CM | POA: Diagnosis present

## 2021-09-16 DIAGNOSIS — I10 Essential (primary) hypertension: Secondary | ICD-10-CM | POA: Diagnosis present

## 2021-09-16 DIAGNOSIS — I471 Supraventricular tachycardia: Secondary | ICD-10-CM | POA: Diagnosis not present

## 2021-09-16 DIAGNOSIS — I350 Nonrheumatic aortic (valve) stenosis: Secondary | ICD-10-CM

## 2021-09-16 DIAGNOSIS — I2510 Atherosclerotic heart disease of native coronary artery without angina pectoris: Secondary | ICD-10-CM | POA: Diagnosis present

## 2021-09-16 DIAGNOSIS — I4891 Unspecified atrial fibrillation: Secondary | ICD-10-CM | POA: Diagnosis not present

## 2021-09-16 DIAGNOSIS — Z96611 Presence of right artificial shoulder joint: Secondary | ICD-10-CM | POA: Diagnosis present

## 2021-09-16 DIAGNOSIS — Z7982 Long term (current) use of aspirin: Secondary | ICD-10-CM

## 2021-09-16 DIAGNOSIS — I7121 Aneurysm of the ascending aorta, without rupture: Secondary | ICD-10-CM | POA: Diagnosis present

## 2021-09-16 DIAGNOSIS — D62 Acute posthemorrhagic anemia: Secondary | ICD-10-CM | POA: Diagnosis not present

## 2021-09-16 DIAGNOSIS — I7300 Raynaud's syndrome without gangrene: Secondary | ICD-10-CM | POA: Diagnosis present

## 2021-09-16 DIAGNOSIS — I716 Thoracoabdominal aortic aneurysm, without rupture, unspecified: Secondary | ICD-10-CM

## 2021-09-16 DIAGNOSIS — Z79899 Other long term (current) drug therapy: Secondary | ICD-10-CM | POA: Diagnosis not present

## 2021-09-16 DIAGNOSIS — Z952 Presence of prosthetic heart valve: Principal | ICD-10-CM

## 2021-09-16 DIAGNOSIS — R739 Hyperglycemia, unspecified: Secondary | ICD-10-CM | POA: Diagnosis present

## 2021-09-16 DIAGNOSIS — Z96642 Presence of left artificial hip joint: Secondary | ICD-10-CM | POA: Diagnosis present

## 2021-09-16 DIAGNOSIS — T380X5A Adverse effect of glucocorticoids and synthetic analogues, initial encounter: Secondary | ICD-10-CM | POA: Diagnosis present

## 2021-09-16 DIAGNOSIS — Z955 Presence of coronary angioplasty implant and graft: Secondary | ICD-10-CM

## 2021-09-16 DIAGNOSIS — Z006 Encounter for examination for normal comparison and control in clinical research program: Secondary | ICD-10-CM

## 2021-09-16 DIAGNOSIS — Z20822 Contact with and (suspected) exposure to covid-19: Secondary | ICD-10-CM | POA: Diagnosis present

## 2021-09-16 DIAGNOSIS — E669 Obesity, unspecified: Secondary | ICD-10-CM | POA: Diagnosis present

## 2021-09-16 DIAGNOSIS — E559 Vitamin D deficiency, unspecified: Secondary | ICD-10-CM | POA: Diagnosis present

## 2021-09-16 DIAGNOSIS — Z8679 Personal history of other diseases of the circulatory system: Secondary | ICD-10-CM

## 2021-09-16 DIAGNOSIS — Z833 Family history of diabetes mellitus: Secondary | ICD-10-CM

## 2021-09-16 DIAGNOSIS — I252 Old myocardial infarction: Secondary | ICD-10-CM | POA: Diagnosis not present

## 2021-09-16 DIAGNOSIS — Z823 Family history of stroke: Secondary | ICD-10-CM | POA: Diagnosis not present

## 2021-09-16 DIAGNOSIS — D72828 Other elevated white blood cell count: Secondary | ICD-10-CM | POA: Diagnosis not present

## 2021-09-16 DIAGNOSIS — Z8249 Family history of ischemic heart disease and other diseases of the circulatory system: Secondary | ICD-10-CM

## 2021-09-16 HISTORY — PX: TEE WITHOUT CARDIOVERSION: SHX5443

## 2021-09-16 HISTORY — PX: REPLACEMENT ASCENDING AORTA: SHX6068

## 2021-09-16 HISTORY — PX: AORTIC VALVE REPLACEMENT: SHX41

## 2021-09-16 LAB — POCT I-STAT EG7
Acid-Base Excess: 2 mmol/L (ref 0.0–2.0)
Bicarbonate: 27.8 mmol/L (ref 20.0–28.0)
Calcium, Ion: 1.05 mmol/L — ABNORMAL LOW (ref 1.15–1.40)
HCT: 28 % — ABNORMAL LOW (ref 39.0–52.0)
Hemoglobin: 9.5 g/dL — ABNORMAL LOW (ref 13.0–17.0)
O2 Saturation: 95 %
Potassium: 4.3 mmol/L (ref 3.5–5.1)
Sodium: 137 mmol/L (ref 135–145)
TCO2: 29 mmol/L (ref 22–32)
pCO2, Ven: 50.6 mmHg (ref 44.0–60.0)
pH, Ven: 7.348 (ref 7.250–7.430)
pO2, Ven: 83 mmHg — ABNORMAL HIGH (ref 32.0–45.0)

## 2021-09-16 LAB — POCT I-STAT, CHEM 8
BUN: 11 mg/dL (ref 6–20)
BUN: 10 mg/dL (ref 6–20)
BUN: 11 mg/dL (ref 6–20)
BUN: 11 mg/dL (ref 6–20)
BUN: 11 mg/dL (ref 6–20)
Calcium, Ion: 1.2 mmol/L (ref 1.15–1.40)
Calcium, Ion: 1.22 mmol/L (ref 1.15–1.40)
Calcium, Ion: 1.04 mmol/L — ABNORMAL LOW (ref 1.15–1.40)
Calcium, Ion: 1.08 mmol/L — ABNORMAL LOW (ref 1.15–1.40)
Calcium, Ion: 1.07 mmol/L — ABNORMAL LOW (ref 1.15–1.40)
Chloride: 100 mmol/L (ref 98–111)
Chloride: 100 mmol/L (ref 98–111)
Chloride: 101 mmol/L (ref 98–111)
Chloride: 102 mmol/L (ref 98–111)
Chloride: 101 mmol/L (ref 98–111)
Creatinine, Ser: 0.7 mg/dL (ref 0.61–1.24)
Creatinine, Ser: 0.7 mg/dL (ref 0.61–1.24)
Creatinine, Ser: 0.8 mg/dL (ref 0.61–1.24)
Creatinine, Ser: 0.6 mg/dL — ABNORMAL LOW (ref 0.61–1.24)
Creatinine, Ser: 0.6 mg/dL — ABNORMAL LOW (ref 0.61–1.24)
Glucose, Bld: 138 mg/dL — ABNORMAL HIGH (ref 70–99)
Glucose, Bld: 117 mg/dL — ABNORMAL HIGH (ref 70–99)
Glucose, Bld: 175 mg/dL — ABNORMAL HIGH (ref 70–99)
Glucose, Bld: 163 mg/dL — ABNORMAL HIGH (ref 70–99)
Glucose, Bld: 154 mg/dL — ABNORMAL HIGH (ref 70–99)
HCT: 38 % — ABNORMAL LOW (ref 39.0–52.0)
HCT: 37 % — ABNORMAL LOW (ref 39.0–52.0)
HCT: 28 % — ABNORMAL LOW (ref 39.0–52.0)
HCT: 29 % — ABNORMAL LOW (ref 39.0–52.0)
HCT: 27 % — ABNORMAL LOW (ref 39.0–52.0)
Hemoglobin: 12.9 g/dL — ABNORMAL LOW (ref 13.0–17.0)
Hemoglobin: 12.6 g/dL — ABNORMAL LOW (ref 13.0–17.0)
Hemoglobin: 9.5 g/dL — ABNORMAL LOW (ref 13.0–17.0)
Hemoglobin: 9.9 g/dL — ABNORMAL LOW (ref 13.0–17.0)
Hemoglobin: 9.2 g/dL — ABNORMAL LOW (ref 13.0–17.0)
Potassium: 3.7 mmol/L (ref 3.5–5.1)
Potassium: 4.2 mmol/L (ref 3.5–5.1)
Potassium: 4.1 mmol/L (ref 3.5–5.1)
Potassium: 4.9 mmol/L (ref 3.5–5.1)
Potassium: 4.4 mmol/L (ref 3.5–5.1)
Sodium: 137 mmol/L (ref 135–145)
Sodium: 137 mmol/L (ref 135–145)
Sodium: 134 mmol/L — ABNORMAL LOW (ref 135–145)
Sodium: 136 mmol/L (ref 135–145)
Sodium: 137 mmol/L (ref 135–145)
TCO2: 28 mmol/L (ref 22–32)
TCO2: 28 mmol/L (ref 22–32)
TCO2: 25 mmol/L (ref 22–32)
TCO2: 27 mmol/L (ref 22–32)
TCO2: 27 mmol/L (ref 22–32)

## 2021-09-16 LAB — POCT I-STAT 7, (LYTES, BLD GAS, ICA,H+H)
Acid-Base Excess: 3 mmol/L — ABNORMAL HIGH (ref 0.0–2.0)
Acid-Base Excess: 6 mmol/L — ABNORMAL HIGH (ref 0.0–2.0)
Acid-Base Excess: 0 mmol/L (ref 0.0–2.0)
Acid-Base Excess: 2 mmol/L (ref 0.0–2.0)
Acid-Base Excess: 0 mmol/L (ref 0.0–2.0)
Acid-Base Excess: 0 mmol/L (ref 0.0–2.0)
Acid-base deficit: 2 mmol/L (ref 0.0–2.0)
Acid-base deficit: 1 mmol/L (ref 0.0–2.0)
Bicarbonate: 28.5 mmol/L — ABNORMAL HIGH (ref 20.0–28.0)
Bicarbonate: 32.4 mmol/L — ABNORMAL HIGH (ref 20.0–28.0)
Bicarbonate: 24.6 mmol/L (ref 20.0–28.0)
Bicarbonate: 26.9 mmol/L (ref 20.0–28.0)
Bicarbonate: 26 mmol/L (ref 20.0–28.0)
Bicarbonate: 26.4 mmol/L (ref 20.0–28.0)
Bicarbonate: 23.7 mmol/L (ref 20.0–28.0)
Bicarbonate: 25.3 mmol/L (ref 20.0–28.0)
Calcium, Ion: 1.22 mmol/L (ref 1.15–1.40)
Calcium, Ion: 1.06 mmol/L — ABNORMAL LOW (ref 1.15–1.40)
Calcium, Ion: 1.02 mmol/L — ABNORMAL LOW (ref 1.15–1.40)
Calcium, Ion: 1.09 mmol/L — ABNORMAL LOW (ref 1.15–1.40)
Calcium, Ion: 1.08 mmol/L — ABNORMAL LOW (ref 1.15–1.40)
Calcium, Ion: 1.13 mmol/L — ABNORMAL LOW (ref 1.15–1.40)
Calcium, Ion: 1.12 mmol/L — ABNORMAL LOW (ref 1.15–1.40)
Calcium, Ion: 1.15 mmol/L (ref 1.15–1.40)
HCT: 37 % — ABNORMAL LOW (ref 39.0–52.0)
HCT: 31 % — ABNORMAL LOW (ref 39.0–52.0)
HCT: 29 % — ABNORMAL LOW (ref 39.0–52.0)
HCT: 29 % — ABNORMAL LOW (ref 39.0–52.0)
HCT: 28 % — ABNORMAL LOW (ref 39.0–52.0)
HCT: 34 % — ABNORMAL LOW (ref 39.0–52.0)
HCT: 31 % — ABNORMAL LOW (ref 39.0–52.0)
HCT: 31 % — ABNORMAL LOW (ref 39.0–52.0)
Hemoglobin: 12.6 g/dL — ABNORMAL LOW (ref 13.0–17.0)
Hemoglobin: 10.5 g/dL — ABNORMAL LOW (ref 13.0–17.0)
Hemoglobin: 9.9 g/dL — ABNORMAL LOW (ref 13.0–17.0)
Hemoglobin: 9.9 g/dL — ABNORMAL LOW (ref 13.0–17.0)
Hemoglobin: 9.5 g/dL — ABNORMAL LOW (ref 13.0–17.0)
Hemoglobin: 11.6 g/dL — ABNORMAL LOW (ref 13.0–17.0)
Hemoglobin: 10.5 g/dL — ABNORMAL LOW (ref 13.0–17.0)
Hemoglobin: 10.5 g/dL — ABNORMAL LOW (ref 13.0–17.0)
O2 Saturation: 100 %
O2 Saturation: 100 %
O2 Saturation: 100 %
O2 Saturation: 100 %
O2 Saturation: 100 %
O2 Saturation: 100 %
O2 Saturation: 99 %
O2 Saturation: 99 %
Patient temperature: 35
Patient temperature: 36.5
Patient temperature: 36.7
Potassium: 3.7 mmol/L (ref 3.5–5.1)
Potassium: 4.2 mmol/L (ref 3.5–5.1)
Potassium: 4.1 mmol/L (ref 3.5–5.1)
Potassium: 4 mmol/L (ref 3.5–5.1)
Potassium: 4.4 mmol/L (ref 3.5–5.1)
Potassium: 4.1 mmol/L (ref 3.5–5.1)
Potassium: 4.5 mmol/L (ref 3.5–5.1)
Potassium: 4.5 mmol/L (ref 3.5–5.1)
Sodium: 137 mmol/L (ref 135–145)
Sodium: 138 mmol/L (ref 135–145)
Sodium: 134 mmol/L — ABNORMAL LOW (ref 135–145)
Sodium: 138 mmol/L (ref 135–145)
Sodium: 137 mmol/L (ref 135–145)
Sodium: 140 mmol/L (ref 135–145)
Sodium: 139 mmol/L (ref 135–145)
Sodium: 139 mmol/L (ref 135–145)
TCO2: 30 mmol/L (ref 22–32)
TCO2: 34 mmol/L — ABNORMAL HIGH (ref 22–32)
TCO2: 26 mmol/L (ref 22–32)
TCO2: 28 mmol/L (ref 22–32)
TCO2: 27 mmol/L (ref 22–32)
TCO2: 28 mmol/L (ref 22–32)
TCO2: 25 mmol/L (ref 22–32)
TCO2: 27 mmol/L (ref 22–32)
pCO2 arterial: 48.2 mmHg — ABNORMAL HIGH (ref 32.0–48.0)
pCO2 arterial: 54.4 mmHg — ABNORMAL HIGH (ref 32.0–48.0)
pCO2 arterial: 36.3 mmHg (ref 32.0–48.0)
pCO2 arterial: 43.4 mmHg (ref 32.0–48.0)
pCO2 arterial: 46.3 mmHg (ref 32.0–48.0)
pCO2 arterial: 45.9 mmHg (ref 32.0–48.0)
pCO2 arterial: 40.4 mmHg (ref 32.0–48.0)
pCO2 arterial: 46.9 mmHg (ref 32.0–48.0)
pH, Arterial: 7.379 (ref 7.350–7.450)
pH, Arterial: 7.382 (ref 7.350–7.450)
pH, Arterial: 7.438 (ref 7.350–7.450)
pH, Arterial: 7.399 (ref 7.350–7.450)
pH, Arterial: 7.357 (ref 7.350–7.450)
pH, Arterial: 7.359 (ref 7.350–7.450)
pH, Arterial: 7.373 (ref 7.350–7.450)
pH, Arterial: 7.338 — ABNORMAL LOW (ref 7.350–7.450)
pO2, Arterial: 281 mmHg — ABNORMAL HIGH (ref 83.0–108.0)
pO2, Arterial: 524 mmHg — ABNORMAL HIGH (ref 83.0–108.0)
pO2, Arterial: 281 mmHg — ABNORMAL HIGH (ref 83.0–108.0)
pO2, Arterial: 238 mmHg — ABNORMAL HIGH (ref 83.0–108.0)
pO2, Arterial: 194 mmHg — ABNORMAL HIGH (ref 83.0–108.0)
pO2, Arterial: 182 mmHg — ABNORMAL HIGH (ref 83.0–108.0)
pO2, Arterial: 150 mmHg — ABNORMAL HIGH (ref 83.0–108.0)
pO2, Arterial: 168 mmHg — ABNORMAL HIGH (ref 83.0–108.0)

## 2021-09-16 LAB — BASIC METABOLIC PANEL
Anion gap: 3 — ABNORMAL LOW (ref 5–15)
BUN: 13 mg/dL (ref 6–20)
CO2: 24 mmol/L (ref 22–32)
Calcium: 7.2 mg/dL — ABNORMAL LOW (ref 8.9–10.3)
Chloride: 110 mmol/L (ref 98–111)
Creatinine, Ser: 0.82 mg/dL (ref 0.61–1.24)
GFR, Estimated: >60 mL/min (ref >60)
Glucose, Bld: 137 mg/dL — ABNORMAL HIGH (ref 70–99)
Potassium: 4.4 mmol/L (ref 3.5–5.1)
Sodium: 137 mmol/L (ref 135–145)

## 2021-09-16 LAB — HEMOGLOBIN AND HEMATOCRIT, BLOOD
HCT: 29.3 % — ABNORMAL LOW (ref 39.0–52.0)
Hemoglobin: 9.7 g/dL — ABNORMAL LOW (ref 13.0–17.0)

## 2021-09-16 LAB — GLUCOSE, CAPILLARY
Glucose-Capillary: 150 mg/dL — ABNORMAL HIGH (ref 70–99)
Glucose-Capillary: 150 mg/dL — ABNORMAL HIGH (ref 70–99)
Glucose-Capillary: 145 mg/dL — ABNORMAL HIGH (ref 70–99)
Glucose-Capillary: 128 mg/dL — ABNORMAL HIGH (ref 70–99)
Glucose-Capillary: 141 mg/dL — ABNORMAL HIGH (ref 70–99)
Glucose-Capillary: 138 mg/dL — ABNORMAL HIGH (ref 70–99)
Glucose-Capillary: 139 mg/dL — ABNORMAL HIGH (ref 70–99)
Glucose-Capillary: 150 mg/dL — ABNORMAL HIGH (ref 70–99)

## 2021-09-16 LAB — CBC
HCT: 35.6 % — ABNORMAL LOW (ref 39.0–52.0)
HCT: 33.7 % — ABNORMAL LOW (ref 39.0–52.0)
Hemoglobin: 11.8 g/dL — ABNORMAL LOW (ref 13.0–17.0)
Hemoglobin: 11 g/dL — ABNORMAL LOW (ref 13.0–17.0)
MCH: 30.2 pg (ref 26.0–34.0)
MCH: 30 pg (ref 26.0–34.0)
MCHC: 33.1 g/dL (ref 30.0–36.0)
MCHC: 32.6 g/dL (ref 30.0–36.0)
MCV: 91 fL (ref 80.0–100.0)
MCV: 91.8 fL (ref 80.0–100.0)
Platelets: 203 10*3/uL (ref 150–400)
Platelets: 205 10*3/uL (ref 150–400)
RBC: 3.91 MIL/uL — ABNORMAL LOW (ref 4.22–5.81)
RBC: 3.67 MIL/uL — ABNORMAL LOW (ref 4.22–5.81)
RDW: 12.8 % (ref 11.5–15.5)
RDW: 12.8 % (ref 11.5–15.5)
WBC: 17.6 10*3/uL — ABNORMAL HIGH (ref 4.0–10.5)
WBC: 16.6 10*3/uL — ABNORMAL HIGH (ref 4.0–10.5)
nRBC: 0 % (ref 0.0–0.2)
nRBC: 0 % (ref 0.0–0.2)

## 2021-09-16 LAB — ECHO INTRAOPERATIVE TEE
Height: 70 in
Weight: 3968 oz

## 2021-09-16 LAB — APTT: aPTT: 37 seconds — ABNORMAL HIGH (ref 24–36)

## 2021-09-16 LAB — MAGNESIUM: Magnesium: 3.7 mg/dL — ABNORMAL HIGH (ref 1.7–2.4)

## 2021-09-16 LAB — PLATELET COUNT: Platelets: 229 10*3/uL (ref 150–400)

## 2021-09-16 LAB — PROTIME-INR
INR: 1.4 — ABNORMAL HIGH (ref 0.8–1.2)
Prothrombin Time: 16.9 seconds — ABNORMAL HIGH (ref 11.4–15.2)

## 2021-09-16 LAB — FIBRINOGEN: Fibrinogen: 335 mg/dL (ref 210–475)

## 2021-09-16 SURGERY — REPLACEMENT, AORTIC VALVE, OPEN
Anesthesia: General | Site: Chest

## 2021-09-16 MED ORDER — HEPARIN SODIUM (PORCINE) 1000 UNIT/ML IJ SOLN
INTRAMUSCULAR | Status: AC
  Filled 2021-09-16: qty 1

## 2021-09-16 MED ORDER — TRAMADOL HCL 50 MG PO TABS
50.0000 mg | ORAL_TABLET | ORAL | Status: DC | PRN
  Administered 2021-09-16 – 2021-09-17 (×3): 100 mg via ORAL
  Administered 2021-09-17: 50 mg via ORAL
  Administered 2021-09-17 (×2): 100 mg via ORAL
  Administered 2021-09-18: 50 mg via ORAL
  Filled 2021-09-16 (×2): qty 2
  Filled 2021-09-16: qty 1
  Filled 2021-09-16: qty 2
  Filled 2021-09-16: qty 1
  Filled 2021-09-16 (×2): qty 2

## 2021-09-16 MED ORDER — NITROGLYCERIN IN D5W 200-5 MCG/ML-% IV SOLN: 0.0000 ug/min | INTRAVENOUS | Status: DC

## 2021-09-16 MED ORDER — MIDAZOLAM HCL (PF) 10 MG/2ML IJ SOLN
INTRAMUSCULAR | Status: AC
  Filled 2021-09-16: qty 2

## 2021-09-16 MED ORDER — PROTAMINE SULFATE 10 MG/ML IV SOLN
INTRAVENOUS | Status: AC
  Filled 2021-09-16: qty 25

## 2021-09-16 MED ORDER — ROCURONIUM BROMIDE 10 MG/ML (PF) SYRINGE
PREFILLED_SYRINGE | INTRAVENOUS | Status: DC | PRN
  Administered 2021-09-16: 40 mg via INTRAVENOUS
  Administered 2021-09-16 (×2): 50 mg via INTRAVENOUS
  Administered 2021-09-16: 40 mg via INTRAVENOUS

## 2021-09-16 MED ORDER — HEPARIN SODIUM (PORCINE) 1000 UNIT/ML IJ SOLN
INTRAMUSCULAR | Status: DC | PRN
Start: 2021-09-16 — End: 2021-09-16
  Administered 2021-09-16: 37000 [IU] via INTRAVENOUS

## 2021-09-16 MED ORDER — FENTANYL CITRATE (PF) 250 MCG/5ML IJ SOLN
INTRAMUSCULAR | Status: AC
  Filled 2021-09-16: qty 5

## 2021-09-16 MED ORDER — LIDOCAINE 2% (20 MG/ML) 5 ML SYRINGE
INTRAMUSCULAR | Status: AC
  Filled 2021-09-16: qty 5

## 2021-09-16 MED ORDER — METOPROLOL TARTRATE 5 MG/5ML IV SOLN: 2.5000 mg | INTRAVENOUS | Status: DC | PRN

## 2021-09-16 MED ORDER — ~~LOC~~ CARDIAC SURGERY, PATIENT & FAMILY EDUCATION
Freq: Once | Status: DC
  Filled 2021-09-16: qty 1

## 2021-09-16 MED ORDER — LACTATED RINGERS IV SOLN: INTRAVENOUS | Status: DC

## 2021-09-16 MED ORDER — FENTANYL CITRATE (PF) 250 MCG/5ML IJ SOLN
INTRAMUSCULAR | Status: DC | PRN
  Administered 2021-09-16 (×2): 150 ug via INTRAVENOUS
  Administered 2021-09-16: 100 ug via INTRAVENOUS
  Administered 2021-09-16 (×2): 50 ug via INTRAVENOUS
  Administered 2021-09-16 (×2): 100 ug via INTRAVENOUS
  Administered 2021-09-16 (×2): 50 ug via INTRAVENOUS
  Administered 2021-09-16: 250 ug via INTRAVENOUS
  Administered 2021-09-16: 100 ug via INTRAVENOUS
  Administered 2021-09-16 (×2): 50 ug via INTRAVENOUS

## 2021-09-16 MED ORDER — SODIUM CHLORIDE 0.9 % IV SOLN: 250.0000 mL | INTRAVENOUS | Status: DC

## 2021-09-16 MED ORDER — MAGNESIUM SULFATE 4 GM/100ML IV SOLN
4.0000 g | Freq: Once | INTRAVENOUS | Status: AC
  Administered 2021-09-16: 4 g via INTRAVENOUS
  Filled 2021-09-16: qty 100

## 2021-09-16 MED ORDER — SODIUM CHLORIDE 0.9 % IV SOLN: INTRAVENOUS | Status: DC

## 2021-09-16 MED ORDER — ACETAMINOPHEN 160 MG/5ML PO SOLN: 650.0000 mg | Freq: Once | ORAL | Status: AC

## 2021-09-16 MED ORDER — CHLORHEXIDINE GLUCONATE CLOTH 2 % EX PADS
6.0000 | MEDICATED_PAD | Freq: Every day | CUTANEOUS | Status: DC
  Administered 2021-09-16 – 2021-09-17 (×2): 6 via TOPICAL

## 2021-09-16 MED ORDER — ACETAMINOPHEN 500 MG PO TABS
1000.0000 mg | ORAL_TABLET | Freq: Four times a day (QID) | ORAL | Status: DC
  Administered 2021-09-16 – 2021-09-18 (×6): 1000 mg via ORAL
  Filled 2021-09-16 (×4): qty 2

## 2021-09-16 MED ORDER — HEMOSTATIC AGENTS (NO CHARGE) OPTIME
TOPICAL | Status: DC | PRN
  Administered 2021-09-16 (×3): 1 via TOPICAL

## 2021-09-16 MED ORDER — LACTATED RINGERS IV SOLN: 500.0000 mL | Freq: Once | INTRAVENOUS | Status: DC | PRN

## 2021-09-16 MED ORDER — PROPOFOL 10 MG/ML IV BOLUS
INTRAVENOUS | Status: DC | PRN
  Administered 2021-09-16: 80 mg via INTRAVENOUS
  Administered 2021-09-16: 120 mg via INTRAVENOUS

## 2021-09-16 MED ORDER — ACETAMINOPHEN 160 MG/5ML PO SOLN: 1000.0000 mg | Freq: Four times a day (QID) | ORAL | Status: DC

## 2021-09-16 MED ORDER — MIDAZOLAM HCL (PF) 5 MG/ML IJ SOLN
INTRAMUSCULAR | Status: DC | PRN
  Administered 2021-09-16 (×2): 2 mg via INTRAVENOUS
  Administered 2021-09-16 (×2): 1 mg via INTRAVENOUS
  Administered 2021-09-16 (×2): 2 mg via INTRAVENOUS

## 2021-09-16 MED ORDER — DEXMEDETOMIDINE HCL IN NACL 400 MCG/100ML IV SOLN
0.0000 ug/kg/h | INTRAVENOUS | Status: DC
  Filled 2021-09-16: qty 100

## 2021-09-16 MED ORDER — SODIUM CHLORIDE 0.9% FLUSH
3.0000 mL | Freq: Two times a day (BID) | INTRAVENOUS | Status: DC
  Administered 2021-09-17 – 2021-09-18 (×2): 3 mL via INTRAVENOUS

## 2021-09-16 MED ORDER — DOCUSATE SODIUM 100 MG PO CAPS
200.0000 mg | ORAL_CAPSULE | Freq: Every day | ORAL | Status: DC
  Administered 2021-09-17: 200 mg via ORAL
  Filled 2021-09-16: qty 2

## 2021-09-16 MED ORDER — POTASSIUM CHLORIDE 10 MEQ/50ML IV SOLN: 10.0000 meq | INTRAVENOUS | Status: AC

## 2021-09-16 MED ORDER — VANCOMYCIN HCL IN DEXTROSE 1-5 GM/200ML-% IV SOLN
1000.0000 mg | Freq: Once | INTRAVENOUS | Status: AC
  Administered 2021-09-16: 1000 mg via INTRAVENOUS
  Filled 2021-09-16: qty 200

## 2021-09-16 MED ORDER — HYDROCORTISONE SOD SUC (PF) 250 MG IJ SOLR
INTRAMUSCULAR | Status: AC
  Filled 2021-09-16: qty 250

## 2021-09-16 MED ORDER — PHENYLEPHRINE 40 MCG/ML (10ML) SYRINGE FOR IV PUSH (FOR BLOOD PRESSURE SUPPORT)
PREFILLED_SYRINGE | INTRAVENOUS | Status: DC | PRN
  Administered 2021-09-16: 40 ug via INTRAVENOUS
  Administered 2021-09-16: 120 ug via INTRAVENOUS
  Administered 2021-09-16: 80 ug via INTRAVENOUS
  Administered 2021-09-16: 40 ug via INTRAVENOUS

## 2021-09-16 MED ORDER — PROPOFOL 10 MG/ML IV BOLUS
INTRAVENOUS | Status: AC
  Filled 2021-09-16: qty 20

## 2021-09-16 MED ORDER — CHLORHEXIDINE GLUCONATE 0.12 % MT SOLN
15.0000 mL | OROMUCOSAL | Status: AC
  Administered 2021-09-16: 15 mL via OROMUCOSAL

## 2021-09-16 MED ORDER — HYDROCORTISONE SOD SUC (PF) 100 MG IJ SOLR
INTRAMUSCULAR | Status: DC | PRN
  Administered 2021-09-16: 125 mg via INTRAVENOUS

## 2021-09-16 MED ORDER — ASPIRIN EC 325 MG PO TBEC: 325.0000 mg | DELAYED_RELEASE_TABLET | Freq: Every day | ORAL | Status: DC

## 2021-09-16 MED ORDER — INSULIN REGULAR(HUMAN) IN NACL 100-0.9 UT/100ML-% IV SOLN: INTRAVENOUS | Status: DC

## 2021-09-16 MED ORDER — 0.9 % SODIUM CHLORIDE (POUR BTL) OPTIME
TOPICAL | Status: DC | PRN
  Administered 2021-09-16: 5000 mL

## 2021-09-16 MED ORDER — ROCURONIUM BROMIDE 10 MG/ML (PF) SYRINGE
PREFILLED_SYRINGE | INTRAVENOUS | Status: AC
  Filled 2021-09-16: qty 20

## 2021-09-16 MED ORDER — PHENYLEPHRINE HCL-NACL 20-0.9 MG/250ML-% IV SOLN: 0.0000 ug/min | INTRAVENOUS | Status: DC

## 2021-09-16 MED ORDER — ONDANSETRON HCL 4 MG/2ML IJ SOLN
4.0000 mg | Freq: Four times a day (QID) | INTRAMUSCULAR | Status: DC | PRN
  Administered 2021-09-16 – 2021-09-18 (×3): 4 mg via INTRAVENOUS
  Filled 2021-09-16 (×3): qty 2

## 2021-09-16 MED ORDER — FAMOTIDINE IN NACL 20-0.9 MG/50ML-% IV SOLN
20.0000 mg | Freq: Two times a day (BID) | INTRAVENOUS | Status: DC
  Administered 2021-09-16: 20 mg via INTRAVENOUS
  Filled 2021-09-16: qty 50

## 2021-09-16 MED ORDER — OXYCODONE HCL 5 MG PO TABS
5.0000 mg | ORAL_TABLET | ORAL | Status: DC | PRN
  Administered 2021-09-16 – 2021-09-17 (×6): 10 mg via ORAL
  Administered 2021-09-18: 5 mg via ORAL
  Filled 2021-09-16 (×2): qty 2
  Filled 2021-09-16: qty 1
  Filled 2021-09-16 (×4): qty 2

## 2021-09-16 MED ORDER — CHLORHEXIDINE GLUCONATE 4 % EX LIQD: 30.0000 mL | CUTANEOUS | Status: DC

## 2021-09-16 MED ORDER — METOPROLOL TARTRATE 12.5 MG HALF TABLET
12.5000 mg | ORAL_TABLET | Freq: Two times a day (BID) | ORAL | Status: DC
  Administered 2021-09-17 (×2): 12.5 mg via ORAL
  Filled 2021-09-16 (×2): qty 1

## 2021-09-16 MED ORDER — DEXAMETHASONE SODIUM PHOSPHATE 10 MG/ML IJ SOLN
INTRAMUSCULAR | Status: AC
  Filled 2021-09-16: qty 1

## 2021-09-16 MED ORDER — PANTOPRAZOLE SODIUM 40 MG PO TBEC: 40.0000 mg | DELAYED_RELEASE_TABLET | Freq: Every day | ORAL | Status: DC

## 2021-09-16 MED ORDER — PHENYLEPHRINE 40 MCG/ML (10ML) SYRINGE FOR IV PUSH (FOR BLOOD PRESSURE SUPPORT)
PREFILLED_SYRINGE | INTRAVENOUS | Status: AC
  Filled 2021-09-16: qty 10

## 2021-09-16 MED ORDER — MORPHINE SULFATE (PF) 2 MG/ML IV SOLN
1.0000 mg | INTRAVENOUS | Status: DC | PRN
  Administered 2021-09-16 – 2021-09-18 (×7): 2 mg via INTRAVENOUS
  Filled 2021-09-16 (×7): qty 1

## 2021-09-16 MED ORDER — BISACODYL 5 MG PO TBEC
10.0000 mg | DELAYED_RELEASE_TABLET | Freq: Every day | ORAL | Status: DC
  Administered 2021-09-17: 10 mg via ORAL
  Filled 2021-09-16: qty 2

## 2021-09-16 MED ORDER — LACTATED RINGERS IV SOLN
INTRAVENOUS | Status: DC | PRN
Start: 2021-09-16 — End: 2021-09-16

## 2021-09-16 MED ORDER — THROMBIN 20000 UNITS EX SOLR
OROMUCOSAL | Status: DC | PRN
  Administered 2021-09-16 (×3): 4 mL via TOPICAL

## 2021-09-16 MED ORDER — METOPROLOL TARTRATE 12.5 MG HALF TABLET
12.5000 mg | ORAL_TABLET | Freq: Once | ORAL | Status: AC
  Administered 2021-09-16: 12.5 mg via ORAL
  Filled 2021-09-16: qty 1

## 2021-09-16 MED ORDER — MIDAZOLAM HCL 2 MG/2ML IJ SOLN
2.0000 mg | INTRAMUSCULAR | Status: DC | PRN
  Administered 2021-09-16 (×2): 2 mg via INTRAVENOUS
  Filled 2021-09-16 (×3): qty 2

## 2021-09-16 MED ORDER — THROMBIN 20000 UNITS EX KIT
PACK | CUTANEOUS | Status: DC | PRN
  Administered 2021-09-16: 20000 [IU] via TOPICAL

## 2021-09-16 MED ORDER — ORAL CARE MOUTH RINSE
15.0000 mL | Freq: Two times a day (BID) | OROMUCOSAL | Status: DC
  Administered 2021-09-16 – 2021-09-20 (×8): 15 mL via OROMUCOSAL

## 2021-09-16 MED ORDER — CEFAZOLIN SODIUM-DEXTROSE 2-4 GM/100ML-% IV SOLN
2.0000 g | Freq: Three times a day (TID) | INTRAVENOUS | Status: AC
  Administered 2021-09-16 – 2021-09-18 (×6): 2 g via INTRAVENOUS
  Filled 2021-09-16 (×6): qty 100

## 2021-09-16 MED ORDER — ALBUMIN HUMAN 5 % IV SOLN
250.0000 mL | INTRAVENOUS | Status: DC | PRN
  Administered 2021-09-16 (×3): 12.5 g via INTRAVENOUS
  Filled 2021-09-16: qty 250

## 2021-09-16 MED ORDER — METOPROLOL TARTRATE 25 MG/10 ML ORAL SUSPENSION: 12.5000 mg | Freq: Two times a day (BID) | ORAL | Status: DC

## 2021-09-16 MED ORDER — ALBUMIN HUMAN 5 % IV SOLN: INTRAVENOUS | Status: DC | PRN

## 2021-09-16 MED ORDER — DEXAMETHASONE SODIUM PHOSPHATE 10 MG/ML IJ SOLN
INTRAMUSCULAR | Status: DC | PRN
  Administered 2021-09-16: 10 mg via INTRAVENOUS

## 2021-09-16 MED ORDER — BISACODYL 10 MG RE SUPP: 10.0000 mg | Freq: Every day | RECTAL | Status: DC

## 2021-09-16 MED ORDER — PROTAMINE SULFATE 10 MG/ML IV SOLN
INTRAVENOUS | Status: AC
  Filled 2021-09-16: qty 5

## 2021-09-16 MED ORDER — THROMBIN (RECOMBINANT) 20000 UNITS EX SOLR
CUTANEOUS | Status: AC
  Filled 2021-09-16: qty 20000

## 2021-09-16 MED ORDER — PROTAMINE SULFATE 10 MG/ML IV SOLN
INTRAVENOUS | Status: DC | PRN
  Administered 2021-09-16: 320 mg via INTRAVENOUS
  Administered 2021-09-16: 30 mg via INTRAVENOUS

## 2021-09-16 MED ORDER — ACETAMINOPHEN 650 MG RE SUPP
650.0000 mg | Freq: Once | RECTAL | Status: AC
  Administered 2021-09-16: 650 mg via RECTAL

## 2021-09-16 MED ORDER — DEXMEDETOMIDINE (PRECEDEX) IN NS 20 MCG/5ML (4 MCG/ML) IV SYRINGE
PREFILLED_SYRINGE | INTRAVENOUS | Status: AC
  Filled 2021-09-16: qty 5

## 2021-09-16 MED ORDER — DEXTROSE 50 % IV SOLN: 0.0000 mL | INTRAVENOUS | Status: DC | PRN

## 2021-09-16 MED ORDER — LACTATED RINGERS IV SOLN: INTRAVENOUS | Status: DC | PRN

## 2021-09-16 MED ORDER — CHLORHEXIDINE GLUCONATE 0.12 % MT SOLN
15.0000 mL | Freq: Once | OROMUCOSAL | Status: AC
  Administered 2021-09-16: 15 mL via OROMUCOSAL
  Filled 2021-09-16: qty 15

## 2021-09-16 MED ORDER — SODIUM CHLORIDE 0.45 % IV SOLN: INTRAVENOUS | Status: DC | PRN

## 2021-09-16 MED ORDER — SODIUM CHLORIDE 0.9% FLUSH: 3.0000 mL | INTRAVENOUS | Status: DC | PRN

## 2021-09-16 MED ORDER — HEPARIN SODIUM (PORCINE) 1000 UNIT/ML IJ SOLN
INTRAMUSCULAR | Status: AC
  Filled 2021-09-16: qty 10

## 2021-09-16 MED ORDER — ASPIRIN 81 MG PO CHEW: 324.0000 mg | CHEWABLE_TABLET | Freq: Every day | ORAL | Status: DC

## 2021-09-16 SURGICAL SUPPLY — 90 items
ADAPTER CARDIO PERF ANTE/RETRO (ADAPTER) ×4 IMPLANT
ADPR PRFSN 84XANTGRD RTRGD (ADAPTER) ×3
BAG DECANTER FOR FLEXI CONT (MISCELLANEOUS) IMPLANT
BLADE CLIPPER SURG (BLADE) ×4 IMPLANT
BLADE STERNUM SYSTEM 6 (BLADE) ×4 IMPLANT
BLADE SURG 15 STRL LF DISP TIS (BLADE) ×3 IMPLANT
BLADE SURG 15 STRL SS (BLADE) ×4
CANISTER SUCT 3000ML PPV (MISCELLANEOUS) ×4 IMPLANT
CANNULA ARTERIAL NVNT 3/8 22FR (MISCELLANEOUS) ×1 IMPLANT
CANNULA GUNDRY RCSP 15FR (MISCELLANEOUS) ×5 IMPLANT
CANNULA SUMP PERICARDIAL (CANNULA) ×1 IMPLANT
CATH HEART VENT LEFT (CATHETERS) ×3 IMPLANT
CATH ROBINSON RED A/P 18FR (CATHETERS) ×12 IMPLANT
CATH THORACIC 36FR (CATHETERS) ×4 IMPLANT
CATH THORACIC 36FR RT ANG (CATHETERS) ×4 IMPLANT
CNTNR URN SCR LID CUP LEK RST (MISCELLANEOUS) ×3 IMPLANT
CONT SPEC 4OZ STRL OR WHT (MISCELLANEOUS) ×12
CONTAINER PROTECT SURGISLUSH (MISCELLANEOUS) ×8 IMPLANT
COVER SURGICAL LIGHT HANDLE (MISCELLANEOUS) ×4 IMPLANT
DEVICE SUT CK QUICK LOAD INDV (Prosthesis & Implant Heart) ×3 IMPLANT
DEVICE SUT CK QUICK LOAD MINI (Prosthesis & Implant Heart) ×1 IMPLANT
DRAPE CARDIOVASCULAR INCISE (DRAPES) ×4
DRAPE SRG 135X102X78XABS (DRAPES) ×3 IMPLANT
DRAPE WARM FLUID 44X44 (DRAPES) ×4 IMPLANT
DRSG COVADERM 4X14 (GAUZE/BANDAGES/DRESSINGS) ×4 IMPLANT
ELECT CAUTERY BLADE 6.4 (BLADE) ×4 IMPLANT
ELECT REM PT RETURN 9FT ADLT (ELECTROSURGICAL) ×8
ELECTRODE REM PT RTRN 9FT ADLT (ELECTROSURGICAL) ×6 IMPLANT
FELT TEFLON 1X6 (MISCELLANEOUS) ×9 IMPLANT
GAUZE 4X4 16PLY ~~LOC~~+RFID DBL (SPONGE) ×1 IMPLANT
GAUZE SPONGE 4X4 12PLY STRL (GAUZE/BANDAGES/DRESSINGS) ×4 IMPLANT
GLOVE SURG ENC MOIS LTX SZ6 (GLOVE) IMPLANT
GLOVE SURG ENC MOIS LTX SZ6.5 (GLOVE) IMPLANT
GLOVE SURG ENC MOIS LTX SZ7 (GLOVE) IMPLANT
GLOVE SURG ENC MOIS LTX SZ7.5 (GLOVE) IMPLANT
GLOVE SURG MICRO LTX SZ6 (GLOVE) ×2 IMPLANT
GLOVE SURG MICRO LTX SZ7 (GLOVE) ×9 IMPLANT
GOWN STRL REUS W/ TWL LRG LVL3 (GOWN DISPOSABLE) ×12 IMPLANT
GOWN STRL REUS W/ TWL XL LVL3 (GOWN DISPOSABLE) ×3 IMPLANT
GOWN STRL REUS W/TWL LRG LVL3 (GOWN DISPOSABLE) ×28
GOWN STRL REUS W/TWL XL LVL3 (GOWN DISPOSABLE) ×4
GRAFT HEMASHIELD 28X40 (Vascular Products) ×4 IMPLANT
GRAFT HEMASHIELD 28X50 (Vascular Products) IMPLANT
HEMOSTAT POWDER SURGIFOAM 1G (HEMOSTASIS) ×12 IMPLANT
HEMOSTAT SURGICEL 2X14 (HEMOSTASIS) ×4 IMPLANT
KIT BASIN OR (CUSTOM PROCEDURE TRAY) ×4 IMPLANT
KIT CATH CPB BARTLE (MISCELLANEOUS) ×4 IMPLANT
KIT SUCTION CATH 14FR (SUCTIONS) ×4 IMPLANT
KIT SUT CK MINI COMBO 4X17 (Prosthesis & Implant Heart) ×1 IMPLANT
KIT TURNOVER KIT B (KITS) ×4 IMPLANT
LINE VENT (MISCELLANEOUS) ×1 IMPLANT
NDL SUT 4 .5 CRC FRENCH EYE (NEEDLE) IMPLANT
NEEDLE FRENCH EYE (NEEDLE) ×4
NS IRRIG 1000ML POUR BTL (IV SOLUTION) ×23 IMPLANT
PACK E OPEN HEART (SUTURE) ×4 IMPLANT
PACK OPEN HEART (CUSTOM PROCEDURE TRAY) ×4 IMPLANT
PAD ARMBOARD 7.5X6 YLW CONV (MISCELLANEOUS) ×8 IMPLANT
POSITIONER HEAD DONUT 9IN (MISCELLANEOUS) ×4 IMPLANT
SEALANT SURG COSEAL 8ML (VASCULAR PRODUCTS) ×1 IMPLANT
SET MPS 3-ND DEL (MISCELLANEOUS) ×1 IMPLANT
SET VEIN GRAFT PERF (SET/KITS/TRAYS/PACK) ×1 IMPLANT
SPONGE T-LAP 18X18 ~~LOC~~+RFID (SPONGE) ×5 IMPLANT
SPONGE T-LAP 4X18 ~~LOC~~+RFID (SPONGE) ×1 IMPLANT
SUT BONE WAX W31G (SUTURE) ×4 IMPLANT
SUT EB EXC GRN/WHT 2-0 V-5 (SUTURE) ×6 IMPLANT
SUT ETHIBON EXCEL 2-0 V-5 (SUTURE) ×11 IMPLANT
SUT ETHIBOND 2 0 SH (SUTURE) ×4
SUT ETHIBOND 2 0 SH 36X2 (SUTURE) IMPLANT
SUT ETHIBOND V-5 VALVE (SUTURE) ×10 IMPLANT
SUT PROLENE 3 0 SH 48 (SUTURE) ×2 IMPLANT
SUT PROLENE 3 0 SH DA (SUTURE) ×1 IMPLANT
SUT PROLENE 3 0 SH1 36 (SUTURE) ×4 IMPLANT
SUT PROLENE 4 0 RB 1 (SUTURE) ×20
SUT PROLENE 4-0 RB1 .5 CRCL 36 (SUTURE) ×9 IMPLANT
SUT STEEL 6MS V (SUTURE) IMPLANT
SUT TEM PAC WIRE 2 0 SH (SUTURE) ×2 IMPLANT
SUT VIC AB 1 CTX 27 (SUTURE) ×2 IMPLANT
SUT VIC AB 1 CTX 36 (SUTURE) ×8
SUT VIC AB 1 CTX36XBRD ANBCTR (SUTURE) ×6 IMPLANT
SYSTEM SAHARA CHEST DRAIN ATS (WOUND CARE) ×4 IMPLANT
TAPE CLOTH SURG 4X10 WHT LF (GAUZE/BANDAGES/DRESSINGS) ×1 IMPLANT
TAPE PAPER 2X10 WHT MICROPORE (GAUZE/BANDAGES/DRESSINGS) ×1 IMPLANT
TOWEL GREEN STERILE (TOWEL DISPOSABLE) ×4 IMPLANT
TOWEL GREEN STERILE FF (TOWEL DISPOSABLE) ×4 IMPLANT
TRAY FOLEY SLVR 16FR TEMP STAT (SET/KITS/TRAYS/PACK) ×4 IMPLANT
UNDERPAD 30X36 HEAVY ABSORB (UNDERPADS AND DIAPERS) ×4 IMPLANT
VALVE ON-X AORTIC 23MM (Prosthesis & Implant Heart) ×1 IMPLANT
VENT LEFT HEART 12002 (CATHETERS) ×4
WATER STERILE IRR 1000ML POUR (IV SOLUTION) ×8 IMPLANT
YANKAUER SUCT BULB TIP NO VENT (SUCTIONS) ×1 IMPLANT

## 2021-09-16 NOTE — Anesthesia Procedure Notes (Signed)
Central Venous Catheter Insertion Performed by: Gaynelle Adu, MD, anesthesiologist Start/End1/05/2022 7:10 AM, 09/16/2021 7:25 AM Patient location: Pre-op. Preanesthetic checklist: patient identified, IV checked, site marked, risks and benefits discussed, surgical consent, monitors and equipment checked, pre-op evaluation, timeout performed and anesthesia consent Hand hygiene performed  and maximum sterile barriers used  PA cath was placed.Swan type:thermodilution PA Cath depth:40 Procedure performed without using ultrasound guided technique. Attempts: 3 (Unable to float the [facility] into the PA. Will attempt again in the OR.) Patient tolerated the procedure well with no immediate complications.

## 2021-09-16 NOTE — Anesthesia Procedure Notes (Signed)
Central Venous Catheter Insertion Performed by: Roderic Palau, MD, anesthesiologist Start/End1/05/2022 7:10 AM, 09/16/2021 7:25 AM Patient location: Pre-op. Preanesthetic checklist: patient identified, IV checked, site marked, risks and benefits discussed, surgical consent, monitors and equipment checked, pre-op evaluation, timeout performed and anesthesia consent Position: Trendelenburg Lidocaine 1% used for infiltration and patient sedated Hand hygiene performed , maximum sterile barriers used  and Seldinger technique used Catheter size: 9 Fr Total catheter length 10. Central line was placed.MAC introducer Swan type:thermodilution Procedure performed using ultrasound guided technique. Ultrasound Notes:anatomy identified, needle tip was noted to be adjacent to the nerve/plexus identified, no ultrasound evidence of intravascular and/or intraneural injection and image(s) printed for medical record Attempts: 2 (Attempted R IJ. Unable to pass wire.) Following insertion, line sutured, dressing applied and Biopatch. Post procedure assessment: blood return through all ports, free fluid flow and no air  Patient tolerated the procedure well with no immediate complications.

## 2021-09-16 NOTE — Interval H&P Note (Signed)
History and Physical Interval Note:  09/16/2021 6:50 AM  Gabriel Anderson  has presented today for surgery, with the diagnosis of SEVERE AS.  The various methods of treatment have been discussed with the patient and family. After consideration of risks, benefits and other options for treatment, the patient has consented to  Procedure(s) with comments: AORTIC VALVE REPLACEMENT (AVR) (N/A) - Circ Arrest REPLACEMENT ASCENDING AORTA (N/A) TRANSESOPHAGEAL ECHOCARDIOGRAM (TEE) (N/A) as a surgical intervention.  The patient's history has been reviewed, patient examined, no change in status, stable for surgery.  I have reviewed the patient's chart and labs.  Questions were answered to the patient's satisfaction.     Alleen Borne

## 2021-09-16 NOTE — Anesthesia Procedure Notes (Signed)
Procedure Name: Intubation Date/Time: 09/16/2021 7:51 AM Performed by: Vonna Drafts, CRNA Pre-anesthesia Checklist: Patient identified, Emergency Drugs available, Suction available and Patient being monitored Patient Re-evaluated:Patient Re-evaluated prior to induction Oxygen Delivery Method: Circle system utilized Preoxygenation: Pre-oxygenation with 100% oxygen Induction Type: IV induction Ventilation: Mask ventilation without difficulty Laryngoscope Size: Mac and 4 Grade View: Grade I Tube type: Oral Tube size: 8.0 mm Number of attempts: 1 Airway Equipment and Method: Stylet and Oral airway Placement Confirmation: ETT inserted through vocal cords under direct vision, positive ETCO2 and breath sounds checked- equal and bilateral Secured at: 24 cm Tube secured with: Tape Dental Injury: Teeth and Oropharynx as per pre-operative assessment

## 2021-09-16 NOTE — Op Note (Signed)
CARDIOVASCULAR SURGERY OPERATIVE NOTE  09/16/2021  Surgeon:  Gaye Pollack, MD  First Assistant: Jadene Pierini,  PA-C: An experienced assistant was required given the complexity of this surgery and the standard of surgical care. The assistant was needed for exposure, dissection, suctioning, retraction of delicate tissues and sutures, instrument exchange and for overall help during this procedure.    Preoperative Diagnosis:  Severe bicuspid aortic stenosis and ascending aortic aneurysm   Postoperative Diagnosis:  Same   Procedure:  Median Sternotomy Extracorporeal circulation 3.   Supra-coronary replacement of the ascending aorta (hemi-arch) using a 28 mm Hemashield graft under deep hypothermic circulatory arrest 4.   Aortic valve replacement using a 23 mm On-X mechanical valve  Anesthesia:  General Endotracheal   Clinical History/Surgical Indication:    He has a bicuspid aortic valve with severe aortic stenosis and a 4.4 cm fusiform ascending aortic aneurysm.  The diameter of his distal descending aorta is about 2.2 cm.  Given his young age and bicuspid aortic valve I think the best surgical treatment would be replacement of his aortic valve as well as supra coronary replacement of the ascending aortic aneurysm.  His ascending aorta is 2 times normal size.  I think this will decrease his risk of progressive enlargement and acute aortic dissection.  I would plan to use a mechanical valve given his young age.  He does not have any contraindication to anticoagulation and feels that he could take the Coumadin reliably and maintain adequate follow-up. I discussed the operative procedure with the patient and his wife including alternatives, benefits and risks; including but not limited to bleeding, blood transfusion, infection, stroke, myocardial infarction, graft failure, heart block requiring a permanent pacemaker, organ dysfunction, and death.  Elane Fritz understands and agrees to proceed.       Preparation:  The patient was seen in the preoperative holding area and the correct patient, correct operation were confirmed with the patient after reviewing the medical record and catheterization. The consent was signed by me. Preoperative antibiotics were given. A pulmonary arterial line and radial arterial line were placed by the anesthesia team. The patient was taken back to the operating room and positioned supine on the operating room table. After being placed under general endotracheal anesthesia by the anesthesia team a foley catheter was placed. The neck, chest, abdomen, and both legs were prepped with betadine soap and solution and draped in the usual sterile manner. A surgical time-out was taken and the correct patient and operative procedure were confirmed with the nursing and anesthesia staff.  TEE:  Performed by Dr. Gennie Alma. This showed severe AS with a mean gradient of 39 mm Hg with mild AI. LV systolic function normal.   Cardiopulmonary Bypass:  A median sternotomy was performed. The pericardium was opened in the midline. Right ventricular function appeared normal. The ascending aorta was dilated and had no palpable plaque. There were no contraindications to aortic cannulation or cross-clamping. The patient was fully systemically heparinized and the ACT was maintained > 400 sec. The distal ascending aorta was cannulated with a 33 F aortic cannula for arterial inflow. Venous cannulation was performed via the right atrial appendage using a two-staged venous cannula. Aortic occlusion was performed with a single cross-clamp. Systemic cooling to 18 degrees Centigrade and topical cooling of the heart with iced saline were used.  A temperature probe was inserted into the interventricular septum and an insulating pad was placed in the pericardium. CO2 was insufflated into the pericardium  throughout the case to minimize intracardiac air.    Resection and grafting of ascending aortic  aneurysm:  The patient was placed on cardiopulmonary bypass and a left ventricular vent was placed via the right superior pulmonary vein. Systemic cooling was begun with a goal temperature of 18 degrees centigrade by bladder and rectal temperature probes. A retrograde cardioplegia cannula was placed through the right atrium into the coronary sinus without difficulty. A retrograde cerebral perfusion cannula was placed into the SVC through a pursestring suture and the SVC was encircled with a silastic tape.  After 20 minutes of cooling the target temperature of 18 degrees centigrade was reached. BIS was zero. The patient was given Propofol and 125 mg of Solumedrol. The head was packed in ice. The bed was placed in steep trendelenburg. Circulatory arrest was begun and the blood volume emptied into the venous reservoir.  Cold KBC retrograde cardioplegia was given and myocardial temperature dropped to 10 degrees centigrade. Additional doses were given at approximately 60 minute intervals throughout the period of circulatory arrest and cross-clamping. Complete diastolic arrest was maintained. The aortic cannula was removed. The aorta was transected just proximal to the innominate artery beveling the resection out along the undersurface of the aortic arch (Hemiarch replacement). The aortic diameter was measured at 28 mm here. A 28 x 10 mm Hemasheild Platinum vascular graft was prepared. ( Catalog # [id number] P0, Lot [id number], SN [id number]). It was anastomosed to the aortic arch in an end to end manner using 3-0 prolene continuous suture with a felt strip to reinforce the anastomisis. A light coating of CoSeal was applied to seal needle holes. The arterial end of the bypass circuit was then connected to the 40mm side arm graft and circulation was slowly resumed. The tape was removed from the SVC. The aortic graft was cross-clamped proximal to the side arm graft and full CPB support was resumed. Circulatory arrest time  was 23 minutes. The patient was rewarmed to 32 degrees C.   Aortic Valve Replacement:    A transverse aortotomy was performed 1 cm above the level of the commissural posts. The native valve was functionally bicuspid with a calcified raphe between the left and right cusps with calcified leaflets and severe annular calcification. The ostia of the coronary arteries were in normal position and were not obstructed. The native valve leaflets were excised and the annulus was decalcified with rongeurs. Care was taken to remove all particulate debris. The left ventricle was directly inspected for debris and then irrigated with ice saline solution. The annulus was sized and a size 23 mm On-X mechanical valve was chosen. The model number was [facility] and the serial number was [id number]. While the valve was being prepared 2-0 Ethibond pledgeted horizontal mattress sutures were placed around the annulus with the pledgets in a sub-annular position. The sutures were placed through the sewing ring and the valve lowered into place. The sutures were tied using CorKnots The valve seated nicely and the coronary ostia were not obstructed. The prosthetic valve leaflets moved normally and there was no sub-valvular obstruction. Then the ascending graft was cut to the appropriate length and anastomosed to the proximal aorta using continuous 3-0 Prolene suture with a felt strip for reinforcement.  A light coating of CoSeal was applied to the anastomosis for hemostasis.  A vent cannula was placed into the graft to remove any air. Deairing maneuvers were performed and the bed placed in trendelenburg position.   Completion:   The patient was rewarmed  to 37 degrees Centigrade. The crossclamp was removed with a time of 94 minutes. The position of the grafts was satisfactory. The vascular anastomoses all appeared hemostatic. Two temporary epicardial pacing wires were placed on the right atrium and two on the right ventricle. The patient was  paced DDD at 71. The patient was weaned from CPB without difficulty on no inotropic agents. CPB time was 155 minutes. Cardiac output was 5 LPM. TEE showed a normal functioning aortic valve prosthesis with no AI or paravalvular leak. The mean gradient was 8 mm Hg. There was unchanged mild MR. LV function appeared normal. Heparin was fully reversed with protamine and the venous cannula removed. The aortic sidearm graft was ligated with a #1 silk tie and divided. The stump was suture ligated with a 3-0 Prolene pledgetted horizontal mattress suture. Hemostasis was achieved. Two mediastinal drainage tubes were placed. The sternum was closed with double #6 stainless steel wires. The fascia was closed with continuous # 1 vicryl suture. The subcutaneous tissue was closed with 2-0 vicryl continuous suture. The skin was closed with 3-0 vicryl subcuticular suture. All sponge, needle, and instrument counts were reported correct at the end of the case. Dry sterile dressings were placed over the incisions and around the chest tubes which were connected to pleurevac suction. The patient was then transported to the surgical intensive care unit in stable condition.

## 2021-09-16 NOTE — Progress Notes (Signed)
TCTS PM Rounds  Extubated Neuro intact A-V pacing for PACs Chest tubes scant output  Blood pressure 94/69, pulse 80, temperature 98.1 F (36.7 C), resp. rate 14, height 5\' 10"  (1.778 m), weight 112.5 kg, SpO2 99 %.

## 2021-09-16 NOTE — Hospital Course (Addendum)
HPI:  The patient is a 51 year old gentleman with history of hypertension, hyperlipidemia, osteoarthritis, coronary disease status post MI and PCI with LAD stenting x2 in 2013 as well as aortic stenosis that has been followed with periodic echocardiograms. A 2D echo in February 2020 showed a severely calcified trileaflet aortic valve with a mean gradient of 39 mmHg and a valve area of 1.0 cm. Left ventricular ejection fraction was 55%. His mean gradient 6 months later was measured at 28 mmHg. Then in June 2021 his aortic valve mean gradient was measured at 34 mmHg. His most recent echo on 12/14/2020 showed a mean gradient of 35.3 mmHg with a peak gradient of 60 mmHg. Aortic valve area by VTI was 1.06 cm. Dimensionless index was 0.22. There was mild aortic insufficiency with a pressure half-time of 376 ms. Aortic root diameter was measured at 3.6 cm with an ascending aortic diameter 4.0 cm. Left ventricular ejection fraction was 50 to 55% with mild LVH and grade 1 diastolic dysfunction. His most recent echo on 08/19/21 showed severe AS with a mean gradient of 39, peak of 67, DI of 0.21 and AVA of 0.75 cm2. There was mild AI with a pressure half time of 420 msec.  LVEF was 50-55% with severe hypertrophy of the basal septum and otherwise mild to moderate LVH with grade ll diastolic dysfunction.   He reports having problems over the past 2 years with exertional shortness of breath and fatigue which has progressed over the past 6 months. 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 He frequently has extreme fatigue in his legs and has to stop. He said that he recently had to walk from his car into the [facility] and had to stop a few times to rest which is very abnormal for him. He has also noticed some swelling in his ankles recently. He denies any dizziness or syncope. He has had no chest pain or pressure.    He reports having to have a couple teeth  extracted before shoulder surgery last September. He does see a dentist regularly and was just there on 03/22/2021.    Surgery was recommended for his severe aortic stenosis but he wanted to wait until his son's high school football season was over.  He had a CTA of the chest on 05/16/2021 which showed a 4.4 cm fusiform ascending aortic aneurysm that appeared to begin just beyond the sinotubular junction and extended up to the distal ascending aorta.  His symptoms have been relatively stable with exertional fatigue and shortness of breath.  The patient and all pertinent studies were reviewed and it was Dr. Vivi Martens recommendation to proceed with aortic valve replacement and repair of a sending thoracic aneurysm.  He was admitted this hospitalization for the procedure.  Hospital course the patient was admitted electively and on 09/16/2021 taken the operating room at which time he underwent aortic valve replacement with a 23 mm ON-X mechanical valve and repair of the ascending thoracic aneurysm with Hemashield graft.  Patient tolerated the procedure well was taken to the surgical intensive care unit in stable condition.  Postoperative hospital course:  The patient was extubated using standard post cardiac surgical protocols without difficulty.  He is remained neurologically intact.  All routine lines, monitors and drainage devices have been discontinued in the standard fashion.  He has been started on Coumadin with his daily dose adjustments with INR monitoring.  He is tolerating gradually  increasing activities using standard cardiac surgical rehabilitation protocols.  On postoperative day #2 he was transferred to 4 E. telemetry unit for ongoing recovery.  Incisions are noted to be healing well without evidence of infection.  On postop day 3 he developed atrial fibrillation.  He subsequently been chemically cardioverted to sinus rhythm on oral amiodarone and his beta-blocker has been uptitrated.  He has been  weaned off oxygen.  He is maintaining good saturations on room air.  Incisions are noted to be healing well without evidence of infection.  He is tolerating routine cardiac rehab modalities without difficulty.

## 2021-09-16 NOTE — Anesthesia Procedure Notes (Signed)
Arterial Line Insertion Start/End1/05/2022 7:00 AM, 09/16/2021 7:05 AM Performed by: Dorris Singh, MD, Kara Mead, CRNA, CRNA  Patient location: Pre-op. Preanesthetic checklist: patient identified, IV checked, site marked, risks and benefits discussed, surgical consent, monitors and equipment checked, pre-op evaluation, timeout performed and anesthesia consent Lidocaine 1% used for infiltration and patient sedated Left, radial was placed Catheter size: 20 G Hand hygiene performed  and maximum sterile barriers used   Attempts: 1 Procedure performed without using ultrasound guided technique. Following insertion, dressing applied and Biopatch. Post procedure assessment: normal  Patient tolerated the procedure well with no immediate complications.

## 2021-09-16 NOTE — Care Management (Signed)
°  Transition of Care [facility]) Screening Note   Patient Details  Name: Gabriel Anderson Date of Birth: 1971-06-11   Transition of Care [facility]) CM/SW Contact:    Gala Lewandowsky, RN Phone Number: 09/16/2021, 4:21 PM    Transition of Care Department [facility]) has reviewed patient and no TOC needs have been identified at this time. We will continue to monitor patient advancement through interdisciplinary progression rounds. If new patient transition needs arise, please place a TOC consult.

## 2021-09-16 NOTE — Transfer of Care (Signed)
Immediate Anesthesia Transfer of Care Note  Patient: Gabriel Anderson  Procedure(s) Performed: AORTIC VALVE REPLACEMENT (AVR) USING ON-X PROSTHETIC VALVE (Chest) REPLACEMENT OF ASCENDING AORTA USING HEMASHIELD PLATINUM X10MM X50CM GRAFT TRANSESOPHAGEAL ECHOCARDIOGRAM (TEE) APPLICATION OF CELL SAVER  Patient Location: SICU  Anesthesia Type:General  Level of Consciousness: Patient remains intubated per anesthesia plan  Airway & Oxygen Therapy: Patient remains intubated per anesthesia plan and Patient placed on Ventilator (see vital sign flow sheet for setting)  Post-op Assessment: Report given to RN and Post -op Vital signs reviewed and stable  Post vital signs: Reviewed and stable  Last Vitals:  Vitals Value Taken Time  BP 105/69 09/16/21 1334  Temp 34.9 C 09/16/21 1337  Pulse 80 09/16/21 1337  Resp 12 09/16/21 1337  SpO2 100 % 09/16/21 1337  Vitals shown include unvalidated device data.  Last Pain:  Vitals:   09/16/21 0613  TempSrc:   PainSc: 2       Patients Stated Pain Goal: 2 (09/16/21 1194)  Complications: No notable events documented.

## 2021-09-16 NOTE — Anesthesia Postprocedure Evaluation (Signed)
Anesthesia Post Note  Patient: Gabriel Anderson  Procedure(s) Performed: AORTIC VALVE REPLACEMENT (AVR) USING ON-X 23MM PROSTHETIC VALVE (Chest) REPLACEMENT OF ASCENDING AORTA USING HEMASHIELD PLATINUM 28MM X10MM X50CM GRAFT TRANSESOPHAGEAL ECHOCARDIOGRAM (TEE) APPLICATION OF CELL SAVER     Patient location during evaluation: ICU Anesthesia Type: General Level of consciousness: patient remains intubated per anesthesia plan Pain management: pain level controlled Vital Signs Assessment: post-procedure vital signs reviewed and stable Respiratory status: patient remains intubated per anesthesia plan Postop Assessment: no apparent nausea or vomiting Anesthetic complications: no   No notable events documented.  Last Vitals:  Vitals:   09/16/21 1400 09/16/21 1415  BP:    Pulse: 80 80  Resp: 12 12  Temp: (!) 34.9 C (!) 34.7 C  SpO2: 100% 100%    Last Pain:  Vitals:   09/16/21 0613  TempSrc:   PainSc: 2                  [name]

## 2021-09-16 NOTE — Procedures (Signed)
Extubation Procedure Note  Patient Details:   Name: Gabriel Anderson DOB: [birth year] MRN: [id number]   Airway Documentation:    Vent end date: 09/16/21 Vent end time: 1732   Evaluation  O2 sats: stable throughout Complications: No apparent complications Patient did tolerate procedure well. Bilateral Breath Sounds: Clear   Pt extubated per rapid wean protocol. NIF was -40 and VC was 1.2 L. Pt had cuff leak prior to extubation. No stridor noted.  Guss Bunde 09/16/2021, 5:33 PM

## 2021-09-16 NOTE — Brief Op Note (Signed)
09/16/2021  7:09 AM  PATIENT:  Gabriel Anderson  51 y.o. male  PRE-OPERATIVE DIAGNOSIS:  SEVERE AORTIC STENOSIS  POST-OPERATIVE DIAGNOSIS:  SEVERE AORTIC STENOSIS  PROCEDURE:  Procedure(s) with comments: AORTIC VALVE REPLACEMENT (AVR) USING ON-X 23MM PROSTHETIC VALVE (N/A) - Circ Arrest REPLACEMENT OF ASCENDING AORTA USING HEMASHIELD PLATINUM 28MM X10MM X50CM GRAFT (N/A) TRANSESOPHAGEAL ECHOCARDIOGRAM (TEE) (N/A) APPLICATION OF CELL SAVER  SURGEON:  Surgeon(s) and Role:    * Bartle, Fernande Boyden, MD - Primary  PHYSICIAN ASSISTANT: Enes Wegener PA-C  ASSISTANTS: STAFF   ANESTHESIA:   general  EBL:  638 mL   BLOOD ADMINISTERED:none  DRAINS:  MEDIASTINAL CHEST DRAINS    LOCAL MEDICATIONS USED:  NONE  SPECIMEN:  Source of Specimen:  AORTIC ANEURYSM AND VALVE LEAFLETS  DISPOSITION OF SPECIMEN:  PATHOLOGY  COUNTS:  YES  TOURNIQUET:  * No tourniquets in log *  DICTATION: .Dragon Dictation  PLAN OF CARE: Admit to inpatient   PATIENT DISPOSITION:  ICU - intubated and hemodynamically stable.   Delay start of Pharmacological VTE agent (>24hrs) due to surgical blood loss or risk of bleeding: yes  COMPLICATIONS: NO KNOWN

## 2021-09-17 ENCOUNTER — Inpatient Hospital Stay (HOSPITAL_COMMUNITY): Payer: No Typology Code available for payment source

## 2021-09-17 ENCOUNTER — Encounter (HOSPITAL_COMMUNITY): Payer: Self-pay | Admitting: Surgery

## 2021-09-17 LAB — BASIC METABOLIC PANEL WITH GFR
Anion gap: 9 (ref 5–15)
BUN: 10 mg/dL (ref 6–20)
CO2: 29 mmol/L (ref 22–32)
Calcium: 8.4 mg/dL — ABNORMAL LOW (ref 8.9–10.3)
Chloride: 94 mmol/L — ABNORMAL LOW (ref 98–111)
Creatinine, Ser: 0.85 mg/dL (ref 0.61–1.24)
GFR, Estimated: >60 mL/min
Glucose, Bld: 133 mg/dL — ABNORMAL HIGH (ref 70–99)
Potassium: 3.9 mmol/L (ref 3.5–5.1)
Sodium: 132 mmol/L — ABNORMAL LOW (ref 135–145)

## 2021-09-17 LAB — CBC
HCT: 31.4 % — ABNORMAL LOW (ref 39.0–52.0)
HCT: 33.7 % — ABNORMAL LOW (ref 39.0–52.0)
Hemoglobin: 10.3 g/dL — ABNORMAL LOW (ref 13.0–17.0)
Hemoglobin: 11 g/dL — ABNORMAL LOW (ref 13.0–17.0)
MCH: 29.9 pg (ref 26.0–34.0)
MCH: 30.2 pg (ref 26.0–34.0)
MCHC: 32.8 g/dL (ref 30.0–36.0)
MCHC: 32.6 g/dL (ref 30.0–36.0)
MCV: 91.3 fL (ref 80.0–100.0)
MCV: 92.6 fL (ref 80.0–100.0)
Platelets: 182 10*3/uL (ref 150–400)
Platelets: 204 K/uL (ref 150–400)
RBC: 3.44 MIL/uL — ABNORMAL LOW (ref 4.22–5.81)
RBC: 3.64 MIL/uL — ABNORMAL LOW (ref 4.22–5.81)
RDW: 13.1 % (ref 11.5–15.5)
RDW: 13.2 % (ref 11.5–15.5)
WBC: 17.2 10*3/uL — ABNORMAL HIGH (ref 4.0–10.5)
WBC: 19.6 K/uL — ABNORMAL HIGH (ref 4.0–10.5)
nRBC: 0 % (ref 0.0–0.2)
nRBC: 0 % (ref 0.0–0.2)

## 2021-09-17 LAB — BASIC METABOLIC PANEL
Anion gap: 4 — ABNORMAL LOW (ref 5–15)
BUN: 12 mg/dL (ref 6–20)
CO2: 25 mmol/L (ref 22–32)
Calcium: 7.6 mg/dL — ABNORMAL LOW (ref 8.9–10.3)
Chloride: 106 mmol/L (ref 98–111)
Creatinine, Ser: 0.88 mg/dL (ref 0.61–1.24)
GFR, Estimated: >60 mL/min (ref >60)
Glucose, Bld: 120 mg/dL — ABNORMAL HIGH (ref 70–99)
Potassium: 4.2 mmol/L (ref 3.5–5.1)
Sodium: 135 mmol/L (ref 135–145)

## 2021-09-17 LAB — GLUCOSE, CAPILLARY
Glucose-Capillary: 114 mg/dL — ABNORMAL HIGH (ref 70–99)
Glucose-Capillary: 119 mg/dL — ABNORMAL HIGH (ref 70–99)
Glucose-Capillary: 119 mg/dL — ABNORMAL HIGH (ref 70–99)
Glucose-Capillary: 123 mg/dL — ABNORMAL HIGH (ref 70–99)
Glucose-Capillary: 127 mg/dL — ABNORMAL HIGH (ref 70–99)
Glucose-Capillary: 132 mg/dL — ABNORMAL HIGH (ref 70–99)
Glucose-Capillary: 135 mg/dL — ABNORMAL HIGH (ref 70–99)
Glucose-Capillary: 135 mg/dL — ABNORMAL HIGH (ref 70–99)
Glucose-Capillary: 138 mg/dL — ABNORMAL HIGH (ref 70–99)
Glucose-Capillary: 194 mg/dL — ABNORMAL HIGH (ref 70–99)
Glucose-Capillary: 61 mg/dL — ABNORMAL LOW (ref 70–99)

## 2021-09-17 LAB — MAGNESIUM
Magnesium: 2.7 mg/dL — ABNORMAL HIGH (ref 1.7–2.4)
Magnesium: 2.3 mg/dL (ref 1.7–2.4)

## 2021-09-17 LAB — SURGICAL PATHOLOGY

## 2021-09-17 MED ORDER — WARFARIN - PHYSICIAN DOSING INPATIENT: Freq: Every day | Status: DC

## 2021-09-17 MED ORDER — INSULIN ASPART 100 UNIT/ML IJ SOLN
0.0000 [IU] | INTRAMUSCULAR | Status: DC
  Administered 2021-09-17: 2 [IU] via SUBCUTANEOUS
  Administered 2021-09-17: 1 [IU] via SUBCUTANEOUS

## 2021-09-17 MED ORDER — POTASSIUM CHLORIDE CRYS ER 20 MEQ PO TBCR
20.0000 meq | EXTENDED_RELEASE_TABLET | Freq: Two times a day (BID) | ORAL | Status: AC
  Administered 2021-09-17 (×2): 20 meq via ORAL
  Filled 2021-09-17 (×2): qty 1

## 2021-09-17 MED ORDER — WARFARIN SODIUM 2.5 MG PO TABS
2.5000 mg | ORAL_TABLET | Freq: Once | ORAL | Status: AC
  Administered 2021-09-17: 2.5 mg via ORAL
  Filled 2021-09-17: qty 1

## 2021-09-17 MED ORDER — ASPIRIN EC 81 MG PO TBEC
81.0000 mg | DELAYED_RELEASE_TABLET | Freq: Every day | ORAL | Status: DC
  Administered 2021-09-17: 81 mg via ORAL
  Filled 2021-09-17: qty 1

## 2021-09-17 MED ORDER — KETOROLAC TROMETHAMINE 15 MG/ML IJ SOLN
15.0000 mg | Freq: Once | INTRAMUSCULAR | Status: AC
  Administered 2021-09-17: 15 mg via INTRAVENOUS
  Filled 2021-09-17: qty 1

## 2021-09-17 MED ORDER — FUROSEMIDE 10 MG/ML IJ SOLN
40.0000 mg | Freq: Once | INTRAMUSCULAR | Status: AC
  Administered 2021-09-17: 40 mg via INTRAVENOUS
  Filled 2021-09-17: qty 4

## 2021-09-17 MED ORDER — INSULIN DETEMIR 100 UNIT/ML ~~LOC~~ SOLN
15.0000 [IU] | Freq: Once | SUBCUTANEOUS | Status: AC
  Administered 2021-09-17: 15 [IU] via SUBCUTANEOUS
  Filled 2021-09-17: qty 0.15

## 2021-09-17 MED ORDER — ENOXAPARIN SODIUM 40 MG/0.4ML IJ SOSY
40.0000 mg | PREFILLED_SYRINGE | Freq: Every day | INTRAMUSCULAR | Status: DC
  Administered 2021-09-17: 40 mg via SUBCUTANEOUS
  Filled 2021-09-17: qty 0.4

## 2021-09-17 MED FILL — Mannitol IV Soln 20%: INTRAVENOUS | Qty: 500 | Status: AC

## 2021-09-17 MED FILL — Heparin Sodium (Porcine) Inj 1000 Unit/ML: INTRAMUSCULAR | Qty: 5000 | Status: CN

## 2021-09-17 MED FILL — Electrolyte-R (PH 7.4) Solution: INTRAVENOUS | Qty: 4000 | Status: AC

## 2021-09-17 MED FILL — Lidocaine HCl Local Preservative Free (PF) Inj 2%: INTRAMUSCULAR | Qty: 15 | Status: AC

## 2021-09-17 MED FILL — Heparin Sodium (Porcine) Inj 1000 Unit/ML: Qty: 1000 | Status: AC

## 2021-09-17 MED FILL — Potassium Chloride Inj 2 mEq/ML: INTRAVENOUS | Qty: 40 | Status: AC

## 2021-09-17 MED FILL — Thrombin (Recombinant) For Soln 20000 Unit: CUTANEOUS | Qty: 1 | Status: AC

## 2021-09-17 MED FILL — Sodium Chloride IV Soln 0.9%: INTRAVENOUS | Qty: 2000 | Status: AC

## 2021-09-17 MED FILL — Sodium Bicarbonate IV Soln 8.4%: INTRAVENOUS | Qty: 50 | Status: AC

## 2021-09-17 MED FILL — Heparin Sodium (Porcine) Inj 1000 Unit/ML: INTRAMUSCULAR | Qty: 10 | Status: AC

## 2021-09-17 NOTE — Progress Notes (Signed)
Patient ID: Gabriel Anderson, male   DOB: [birth year], 51 y.o.   MRN: [id number]  TCTS Evening Rounds:   Hemodynamically stable   Ambulated around the ICU today.  Urine output good    CBC    Component Value Date/Time   WBC 19.6 (H) 09/17/2021 1654   RBC 3.64 (L) 09/17/2021 1654   HGB 11.0 (L) 09/17/2021 1654   HGB 13.6 12/14/2020 1632   HCT 33.7 (L) 09/17/2021 1654   HCT 41.3 12/14/2020 1632   PLT 204 09/17/2021 1654   PLT 355 12/14/2020 1632   MCV 92.6 09/17/2021 1654   MCV 88 12/14/2020 1632   MCH 30.2 09/17/2021 1654   MCHC 32.6 09/17/2021 1654   RDW 13.2 09/17/2021 1654   RDW 12.4 12/14/2020 1632   LYMPHSABS 1,938 03/27/2020 1111   MONOABS 0.8 11/01/2018 1125   EOSABS 371 03/27/2020 1111   BASOSABS 57 03/27/2020 1111     BMET    Component Value Date/Time   NA 132 (L) 09/17/2021 1654   NA 138 12/14/2020 1632   K 3.9 09/17/2021 1654   CL 94 (L) 09/17/2021 1654   CO2 29 09/17/2021 1654   GLUCOSE 133 (H) 09/17/2021 1654   BUN 10 09/17/2021 1654   BUN 12 12/14/2020 1632   CREATININE 0.85 09/17/2021 1654   CREATININE 0.97 03/27/2020 1111   CALCIUM 8.4 (L) 09/17/2021 1654   EGFR 94 12/14/2020 1632   GFRNONAA >60 09/17/2021 1654     A/P:  Stable postop course. Continue current plans

## 2021-09-17 NOTE — Progress Notes (Signed)
1 Day Post-Op Procedure(s) (LRB): AORTIC VALVE REPLACEMENT (AVR) USING ON-X 23MM PROSTHETIC VALVE (N/A) REPLACEMENT OF ASCENDING AORTA USING HEMASHIELD PLATINUM 28MM X10MM X50CM GRAFT (N/A) TRANSESOPHAGEAL ECHOCARDIOGRAM (TEE) (N/A) APPLICATION OF CELL SAVER Subjective: Feels well overall.  Objective: Vital signs in last 24 hours: Temp:  [94.5 F (34.7 C)-99.5 F (37.5 C)] 98.2 F (36.8 C) (01/10 0645) Pulse Rate:  [61-81] 71 (01/10 0645) Cardiac Rhythm: Normal sinus rhythm (01/10 0400) Resp:  [7-23] 12 (01/10 0645) BP: (93-117)/(61-81) 93/81 (01/10 0600) SpO2:  [94 %-100 %] 96 % (01/10 0645) Arterial Line BP: (93-144)/(46-67) 125/50 (01/10 0645) FiO2 (%):  [40 %-50 %] 40 % (01/09 1659) Weight:  [115.3 kg] 115.3 kg (01/10 0600)  Hemodynamic parameters for last 24 hours: PAP: (20-37)/(6-24) 20/6 CO:  [2.7 L/min-6 L/min] 4.7 L/min CI:  [1.2 L/min/m2-2.6 L/min/m2] 2 L/min/m2  Intake/Output from previous day: 01/09 0701 - 01/10 0700 In: 4444.8 [I.V.:2995.5; Blood:455; IV Piggyback:994.3] Out: 3302 [Urine:2320; Blood:638; Chest Tube:344] Intake/Output this shift: No intake/output data recorded.  General appearance: alert and cooperative Neurologic: intact Heart: regular rate and rhythm, S1, S2 normal, no murmur,Lungs: clear to auscultation bilaterally Abdomen: soft, non-tender; bowel sounds normal Extremities: edema mild Wound: dressing dry  Lab Results: Recent Labs    09/16/21 1826 09/16/21 1827 09/17/21 0430  WBC 16.6*  --  17.2*  HGB 11.0* 10.5* 10.3*  HCT 33.7* 31.0* 31.4*  PLT 205  --  182   BMET:  Recent Labs    09/16/21 1826 09/16/21 1827 09/17/21 0430  NA 137 139 135  K 4.4 4.5 4.2  CL 110  --  106  CO2 24  --  25  GLUCOSE 137*  --  120*  BUN 13  --  12  CREATININE 0.82  --  0.88  CALCIUM 7.2*  --  7.6*    PT/INR:  Recent Labs    09/16/21 1341  LABPROT 16.9*  INR 1.4*   ABG    Component Value Date/Time   PHART 7.338 (L) 09/16/2021 1827    HCO3 25.3 09/16/2021 1827   TCO2 27 09/16/2021 1827   ACIDBASEDEF 1.0 09/16/2021 1827   O2SAT 99.0 09/16/2021 1827   CBG (last 3)  Recent Labs    09/17/21 0303 09/17/21 0430 09/17/21 0657  GLUCAP 135* 127* 123*   CXR: clear  ECG: NSR 73, incomplete RBBB.  Assessment/Plan: S/P Procedure(s) (LRB): AORTIC VALVE REPLACEMENT (AVR) USING ON-X 23MM PROSTHETIC VALVE (N/A) REPLACEMENT OF ASCENDING AORTA USING HEMASHIELD PLATINUM 28MM X10MM X50CM GRAFT (N/A) TRANSESOPHAGEAL ECHOCARDIOGRAM (TEE) (N/A) APPLICATION OF CELL SAVER  POD 1 Hemodynamically stable in sinus rhythm. Continue low dose Lopressor.  Dangle this am and remove chest tubes.  Diurese  Remove swan and arterial line.  Coumadin 2.5 mg tonight. ASA to 81. Continue Lovenox for DVT prophylaxis.  IS, OOB, mobilize.  Hyperglycemia due to steroids for surgery. Will give a dose of Levemir and transition to [place] today. Preop A1c 5.3. Should be able to stop insulin by tomorrow.    LOS: 1 day    Gaye Pollack 09/17/2021

## 2021-09-18 ENCOUNTER — Inpatient Hospital Stay (HOSPITAL_COMMUNITY): Payer: No Typology Code available for payment source

## 2021-09-18 ENCOUNTER — Other Ambulatory Visit: Payer: Self-pay | Admitting: Cardiology

## 2021-09-18 DIAGNOSIS — I350 Nonrheumatic aortic (valve) stenosis: Secondary | ICD-10-CM

## 2021-09-18 LAB — CBC
HCT: 31.8 % — ABNORMAL LOW (ref 39.0–52.0)
Hemoglobin: 10.4 g/dL — ABNORMAL LOW (ref 13.0–17.0)
MCH: 30.3 pg (ref 26.0–34.0)
MCHC: 32.7 g/dL (ref 30.0–36.0)
MCV: 92.7 fL (ref 80.0–100.0)
Platelets: 191 10*3/uL (ref 150–400)
RBC: 3.43 MIL/uL — ABNORMAL LOW (ref 4.22–5.81)
RDW: 13.2 % (ref 11.5–15.5)
WBC: 20 10*3/uL — ABNORMAL HIGH (ref 4.0–10.5)
nRBC: 0 % (ref 0.0–0.2)

## 2021-09-18 LAB — BASIC METABOLIC PANEL
Anion gap: 5 (ref 5–15)
BUN: 10 mg/dL (ref 6–20)
CO2: 30 mmol/L (ref 22–32)
Calcium: 8.3 mg/dL — ABNORMAL LOW (ref 8.9–10.3)
Chloride: 98 mmol/L (ref 98–111)
Creatinine, Ser: 0.9 mg/dL (ref 0.61–1.24)
GFR, Estimated: >60 mL/min (ref >60)
Glucose, Bld: 116 mg/dL — ABNORMAL HIGH (ref 70–99)
Potassium: 4.2 mmol/L (ref 3.5–5.1)
Sodium: 133 mmol/L — ABNORMAL LOW (ref 135–145)

## 2021-09-18 LAB — GLUCOSE, CAPILLARY
Glucose-Capillary: 113 mg/dL — ABNORMAL HIGH (ref 70–99)
Glucose-Capillary: 130 mg/dL — ABNORMAL HIGH (ref 70–99)
Glucose-Capillary: 131 mg/dL — ABNORMAL HIGH (ref 70–99)
Glucose-Capillary: 135 mg/dL — ABNORMAL HIGH (ref 70–99)

## 2021-09-18 LAB — PROTIME-INR
INR: 1.3 — ABNORMAL HIGH (ref 0.8–1.2)
Prothrombin Time: 15.9 seconds — ABNORMAL HIGH (ref 11.4–15.2)

## 2021-09-18 MED ORDER — POTASSIUM CHLORIDE CRYS ER 20 MEQ PO TBCR
20.0000 meq | EXTENDED_RELEASE_TABLET | Freq: Every day | ORAL | Status: AC
  Administered 2021-09-19 – 2021-09-21 (×3): 20 meq via ORAL
  Filled 2021-09-18 (×3): qty 1

## 2021-09-18 MED ORDER — ONDANSETRON HCL 4 MG PO TABS: 4.0000 mg | ORAL_TABLET | Freq: Four times a day (QID) | ORAL | Status: DC | PRN

## 2021-09-18 MED ORDER — ~~LOC~~ CARDIAC SURGERY, PATIENT & FAMILY EDUCATION: Freq: Once | Status: AC

## 2021-09-18 MED ORDER — PANTOPRAZOLE SODIUM 40 MG PO TBEC
40.0000 mg | DELAYED_RELEASE_TABLET | Freq: Every day | ORAL | Status: DC
  Administered 2021-09-19 – 2021-09-21 (×3): 40 mg via ORAL
  Filled 2021-09-18 (×3): qty 1

## 2021-09-18 MED ORDER — WARFARIN SODIUM 5 MG PO TABS
5.0000 mg | ORAL_TABLET | Freq: Every day | ORAL | Status: DC
  Administered 2021-09-18 – 2021-09-20 (×3): 5 mg via ORAL
  Filled 2021-09-18 (×3): qty 1

## 2021-09-18 MED ORDER — SODIUM CHLORIDE 0.9% FLUSH
3.0000 mL | Freq: Two times a day (BID) | INTRAVENOUS | Status: DC
  Administered 2021-09-18 – 2021-09-21 (×7): 3 mL via INTRAVENOUS

## 2021-09-18 MED ORDER — ACETAMINOPHEN 325 MG PO TABS
650.0000 mg | ORAL_TABLET | Freq: Four times a day (QID) | ORAL | Status: DC | PRN
  Administered 2021-09-18 – 2021-09-19 (×2): 650 mg via ORAL
  Filled 2021-09-18 (×2): qty 2

## 2021-09-18 MED ORDER — POLYETHYLENE GLYCOL 3350 17 G PO PACK
17.0000 g | PACK | Freq: Every day | ORAL | Status: DC
  Administered 2021-09-18 – 2021-09-21 (×4): 17 g via ORAL
  Filled 2021-09-18 (×4): qty 1

## 2021-09-18 MED ORDER — SENNOSIDES-DOCUSATE SODIUM 8.6-50 MG PO TABS
1.0000 | ORAL_TABLET | Freq: Two times a day (BID) | ORAL | Status: DC | PRN
  Administered 2021-09-20 – 2021-09-21 (×2): 1 via ORAL
  Filled 2021-09-18 (×2): qty 1

## 2021-09-18 MED ORDER — SODIUM CHLORIDE 0.9% FLUSH: 3.0000 mL | INTRAVENOUS | Status: DC | PRN

## 2021-09-18 MED ORDER — POTASSIUM CHLORIDE CRYS ER 20 MEQ PO TBCR
20.0000 meq | EXTENDED_RELEASE_TABLET | Freq: Two times a day (BID) | ORAL | Status: AC
  Administered 2021-09-18 (×2): 20 meq via ORAL
  Filled 2021-09-18 (×2): qty 1

## 2021-09-18 MED ORDER — ONDANSETRON HCL 4 MG/2ML IJ SOLN
4.0000 mg | Freq: Four times a day (QID) | INTRAMUSCULAR | Status: DC | PRN
  Administered 2021-09-18 – 2021-09-19 (×3): 4 mg via INTRAVENOUS
  Filled 2021-09-18 (×3): qty 2

## 2021-09-18 MED ORDER — METOPROLOL TARTRATE 25 MG PO TABS
25.0000 mg | ORAL_TABLET | Freq: Two times a day (BID) | ORAL | Status: DC
  Administered 2021-09-18 (×2): 25 mg via ORAL
  Filled 2021-09-18 (×2): qty 1

## 2021-09-18 MED ORDER — SODIUM CHLORIDE 0.9 % IV SOLN: 250.0000 mL | INTRAVENOUS | Status: DC | PRN

## 2021-09-18 MED ORDER — METOPROLOL TARTRATE 25 MG PO TABS: 25.0000 mg | ORAL_TABLET | Freq: Two times a day (BID) | ORAL | Status: DC

## 2021-09-18 MED ORDER — TRAMADOL HCL 50 MG PO TABS
50.0000 mg | ORAL_TABLET | ORAL | Status: DC | PRN
  Administered 2021-09-18 – 2021-09-19 (×3): 100 mg via ORAL
  Filled 2021-09-18 (×3): qty 2

## 2021-09-18 MED ORDER — FUROSEMIDE 10 MG/ML IJ SOLN
40.0000 mg | Freq: Once | INTRAMUSCULAR | Status: AC
  Administered 2021-09-18: 40 mg via INTRAVENOUS
  Filled 2021-09-18: qty 4

## 2021-09-18 MED ORDER — METOPROLOL TARTRATE 25 MG/10 ML ORAL SUSPENSION: 25.0000 mg | Freq: Two times a day (BID) | ORAL | Status: DC

## 2021-09-18 MED ORDER — CHLORHEXIDINE GLUCONATE CLOTH 2 % EX PADS
6.0000 | MEDICATED_PAD | Freq: Every day | CUTANEOUS | Status: DC
  Administered 2021-09-18 – 2021-09-21 (×4): 6 via TOPICAL

## 2021-09-18 MED ORDER — SENNOSIDES-DOCUSATE SODIUM 8.6-50 MG PO TABS: 1.0000 | ORAL_TABLET | Freq: Two times a day (BID) | ORAL | Status: DC | PRN

## 2021-09-18 MED ORDER — FUROSEMIDE 40 MG PO TABS
40.0000 mg | ORAL_TABLET | Freq: Every day | ORAL | Status: DC
  Administered 2021-09-19 – 2021-09-20 (×2): 40 mg via ORAL
  Filled 2021-09-18 (×2): qty 1

## 2021-09-18 MED ORDER — OXYCODONE HCL 5 MG PO TABS
5.0000 mg | ORAL_TABLET | ORAL | Status: DC | PRN
  Administered 2021-09-18 (×2): 5 mg via ORAL
  Administered 2021-09-19 – 2021-09-21 (×9): 10 mg via ORAL
  Filled 2021-09-18: qty 2
  Filled 2021-09-18: qty 1
  Filled 2021-09-18 (×2): qty 2
  Filled 2021-09-18: qty 1
  Filled 2021-09-18 (×6): qty 2

## 2021-09-18 NOTE — Progress Notes (Signed)
2 Days Post-Op Procedure(s) (LRB): AORTIC VALVE REPLACEMENT (AVR) USING ON-X 23MM PROSTHETIC VALVE (N/A) REPLACEMENT OF ASCENDING AORTA USING HEMASHIELD PLATINUM 28MM X10MM X50CM GRAFT (N/A) TRANSESOPHAGEAL ECHOCARDIOGRAM (TEE) (N/A) APPLICATION OF CELL SAVER Subjective: Feels well. Ambulated this am. Pain under adequate control  Objective: Vital signs in last 24 hours: Temp:  [98 F (36.7 C)-98.6 F (37 C)] 98.1 F (36.7 C) (01/11 0655) Pulse Rate:  [67-88] 77 (01/11 0500) Cardiac Rhythm: Normal sinus rhythm (01/11 0500) Resp:  [11-19] 15 (01/11 0500) BP: (122-146)/(64-97) 143/77 (01/11 0500) SpO2:  [90 %-98 %] 94 % (01/11 0500) Weight:  [115.5 kg] 115.5 kg (01/11 0500)  Hemodynamic parameters for last 24 hours:    Intake/Output from previous day: 01/10 0701 - 01/11 0700 In: 3130.3 [P.O.:2520; I.V.:210.2; IV Piggyback:400.1] Out: 4305 [Urine:4175; Chest Tube:130] Intake/Output this shift: No intake/output data recorded.  General appearance: alert and cooperative Neurologic: intact Heart: regular rate and rhythm, S1, S2 normal, no murmur, click, rub or gallop Lungs: clear to auscultation bilaterally Extremities: edema mild Wound: dressing with some dried blood.  Lab Results: Recent Labs    09/17/21 1654 09/18/21 0301  WBC 19.6* 20.0*  HGB 11.0* 10.4*  HCT 33.7* 31.8*  PLT 204 191   BMET:  Recent Labs    09/17/21 1654 09/18/21 0301  NA 132* 133*  K 3.9 4.2  CL 94* 98  CO2 29 30  GLUCOSE 133* 116*  BUN 10 10  CREATININE 0.85 0.90  CALCIUM 8.4* 8.3*    PT/INR:  Recent Labs    09/18/21 0301  LABPROT 15.9*  INR 1.3*   ABG    Component Value Date/Time   PHART 7.338 (L) 09/16/2021 1827   HCO3 25.3 09/16/2021 1827   TCO2 27 09/16/2021 1827   ACIDBASEDEF 1.0 09/16/2021 1827   O2SAT 99.0 09/16/2021 1827   CBG (last 3)  Recent Labs    09/17/21 2357 09/18/21 0313 09/18/21 0653  GLUCAP 131* 113* 135*   CXR: ok  Assessment/Plan: S/P  Procedure(s) (LRB): AORTIC VALVE REPLACEMENT (AVR) USING ON-X 23MM PROSTHETIC VALVE (N/A) REPLACEMENT OF ASCENDING AORTA USING HEMASHIELD PLATINUM 28MM X10MM X50CM GRAFT (N/A) TRANSESOPHAGEAL ECHOCARDIOGRAM (TEE) (N/A) APPLICATION OF CELL SAVER  POD 2 Hemodynamically stable in sinus rhythm. Will increase Lopressor to 25 bid.  DC sleeve, foley, pacing wires.  Coumadin to 5 mg daily.  Glucose under good control. DC CBG's  Continue IS, ambulation  Transfer to 4E today.   LOS: 2 days    Gabriel Anderson 09/18/2021

## 2021-09-18 NOTE — Progress Notes (Signed)
EVENING ROUNDS NOTE :     [address]       [place] [address]             [phone]                 2 Days Post-Op Procedure(s) (LRB): AORTIC VALVE REPLACEMENT (AVR) USING ON-X 23MM PROSTHETIC VALVE (N/A) REPLACEMENT OF ASCENDING AORTA USING HEMASHIELD PLATINUM 28MM X10MM X50CM GRAFT (N/A) TRANSESOPHAGEAL ECHOCARDIOGRAM (TEE) (N/A) APPLICATION OF CELL SAVER   Total Length of Stay:  LOS: 2 days  Events:   No well No events Sinus     BP 134/66    Pulse 80    Temp 98.1 F (36.7 C) (Oral)    Resp 10    Ht 5\' 10"  (1.778 m)    Wt 115.5 kg    SpO2 93%    BMI 36.54 kg/m          sodium chloride      I/O last 3 completed shifts: In: 3698.4 [P.O.:2520; I.V.:478.5; IV Piggyback:699.9] Out: [id number] [Urine:5000; Chest Tube:320]   CBC Latest Ref Rng & Units 09/18/2021 09/17/2021 09/17/2021  WBC 4.0 - 10.5 K/uL 20.0(H) 19.6(H) 17.2(H)  Hemoglobin 13.0 - 17.0 g/dL 10.4(L) 11.0(L) 10.3(L)  Hematocrit 39.0 - 52.0 % 31.8(L) 33.7(L) 31.4(L)  Platelets 150 - 400 K/uL 191 204 182    BMP Latest Ref Rng & Units 09/18/2021 09/17/2021 09/17/2021  Glucose 70 - 99 mg/dL 116(H) 133(H) 120(H)  BUN 6 - 20 mg/dL 10 10 12   Creatinine 0.61 - 1.24 mg/dL 0.90 0.85 0.88  BUN/Creat Ratio 9 - 20 - - -  Sodium 135 - 145 mmol/L 133(L) 132(L) 135  Potassium 3.5 - 5.1 mmol/L 4.2 3.9 4.2  Chloride 98 - 111 mmol/L 98 94(L) 106  CO2 22 - 32 mmol/L 30 29 25   Calcium 8.9 - 10.3 mg/dL 8.3(L) 8.4(L) 7.6(L)    ABG    Component Value Date/Time   PHART 7.338 (L) 09/16/2021 1827   PCO2ART 46.9 09/16/2021 1827   PO2ART 168 (H) 09/16/2021 1827   HCO3 25.3 09/16/2021 1827   TCO2 27 09/16/2021 1827   ACIDBASEDEF 1.0 09/16/2021 1827   O2SAT 99.0 09/16/2021 1827       Melodie Bouillon, MD 09/18/2021 5:33 PM

## 2021-09-18 NOTE — Progress Notes (Signed)
[id number] Atrial and Ventricular pacing wires d/c'd per protocol. Wires fully intact upon removal. Pt tol well.  Vitals signs q 15 min with bedrest x 1 hour.

## 2021-09-18 NOTE — Discharge Summary (Signed)
[address]       [place] [address]             [phone]    Physician Discharge Summary  Patient ID: Gabriel Anderson MRN: [id number] DOB/AGE: 53-03-1971 53 y.o.  Admit date: 09/16/2021 Discharge date: 09/21/2021  Admission Diagnoses: Aortic stenosis and ascending aortic aneurysm  Patient Active Problem List   Diagnosis Date Noted   S/P AVR (aortic valve replacement) 09/16/2021   Ascending aortic aneurysm 08/26/2021   DOE (dyspnea on exertion) 12/19/2020   Deficiency anemia 03/28/2020   Need for hepatitis C screening test 03/27/2020   Traumatic complete tear of right rotator cuff 12/19/2019   Unilateral primary osteoarthritis, right hip 11/17/2019   Hyperglycemia 11/01/2018   Routine general medical examination at a health care facility 11/01/2018   Colon cancer screening 11/01/2018   Vitamin D deficiency 11/01/2018   Morbid obesity (HCC) 10/26/2018   CAD S/P percutaneous coronary angioplasty 10/26/2018   Moderate to severe aortic stenosis 10/26/2018   Arthritis of left hip 12/30/2013   Essential hypertension    Dyslipidemia, goal LDL below 70 06/02/2012   Coronary atherosclerosis of native coronary artery 05/21/2012     Discharge Diagnoses:  Patient Active Problem List   Diagnosis Date Noted   S/P AVR (aortic valve replacement) 09/16/2021   Ascending aortic aneurysm 08/26/2021   DOE (dyspnea on exertion) 12/19/2020   Deficiency anemia 03/28/2020   Need for hepatitis C screening test 03/27/2020   Traumatic complete tear of right rotator cuff 12/19/2019   Unilateral primary osteoarthritis, right hip 11/17/2019   Hyperglycemia 11/01/2018   Routine general medical examination at a health care facility 11/01/2018   Colon cancer screening 11/01/2018   Vitamin D deficiency 11/01/2018   Morbid obesity (HCC) 10/26/2018   CAD S/P percutaneous coronary angioplasty 10/26/2018   Moderate to severe aortic stenosis 10/26/2018   Arthritis of left hip  12/30/2013   Essential hypertension    Dyslipidemia, goal LDL below 70 06/02/2012   Coronary atherosclerosis of native coronary artery 05/21/2012     Discharged Condition: good  HPI:  The patient is a 51 year old gentleman with history of hypertension, hyperlipidemia, osteoarthritis, coronary disease status post MI and PCI with LAD stenting x2 in 2013 as well as aortic stenosis that has been followed with periodic echocardiograms. A 2D echo in February 2020 showed a severely calcified trileaflet aortic valve with a mean gradient of 39 mmHg and a valve area of 1.0 cm. Left ventricular ejection fraction was 55%. His mean gradient 6 months later was measured at 28 mmHg. Then in June 2021 his aortic valve mean gradient was measured at 34 mmHg. His most recent echo on 12/14/2020 showed a mean gradient of 35.3 mmHg with a peak gradient of 60 mmHg. Aortic valve area by VTI was 1.06 cm. Dimensionless index was 0.22. There was mild aortic insufficiency with a pressure half-time of 376 ms. Aortic root diameter was measured at 3.6 cm with an ascending aortic diameter 4.0 cm. Left ventricular ejection fraction was 50 to 55% with mild LVH and grade 1 diastolic dysfunction. His most recent echo on 08/19/21 showed severe AS with a mean gradient of 39, peak of 67, DI of 0.21 and AVA of 0.75 cm2. There was mild AI with a pressure half time of 420 msec.  LVEF was 50-55% with severe hypertrophy of the basal septum and otherwise mild to moderate LVH with grade ll diastolic dysfunction.   He reports having  problems over the past 2 years with exertional shortness of breath and fatigue which has progressed over the past 6 months. 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 He frequently has extreme fatigue in his legs and has to stop. He said that he recently had to walk from his car into the [facility] and had to stop a few times to rest which is very abnormal for  him. He has also noticed some swelling in his ankles recently. He denies any dizziness or syncope. He has had no chest pain or pressure.    He reports having to have a couple teeth extracted before shoulder surgery last September. He does see a dentist regularly and was just there on 03/22/2021.    Surgery was recommended for his severe aortic stenosis but he wanted to wait until his son's high school football season was over.  He had a CTA of the chest on 05/16/2021 which showed a 4.4 cm fusiform ascending aortic aneurysm that appeared to begin just beyond the sinotubular junction and extended up to the distal ascending aorta.  His symptoms have been relatively stable with exertional fatigue and shortness of breath.  The patient and all pertinent studies were reviewed and it was Dr. Vivi Martens recommendation to proceed with aortic valve replacement and repair of a sending thoracic aneurysm.  He was admitted this hospitalization for the procedure.  Hospital Course: The patient was admitted electively and on 09/16/2021 taken the operating room at which time he underwent aortic valve replacement with a 23 mm ON-X mechanical valve and repair of the ascending thoracic aneurysm with Hemashield graft.  Patient tolerated the procedure well was taken to the surgical intensive care unit in stable condition.  Postoperative hospital course:  The patient was extubated using standard post cardiac surgical protocols without difficulty.  He is remained neurologically intact.  All routine lines, monitors and drainage devices have been discontinued in the standard fashion.  He has been started on Coumadin with his daily dose adjustments with INR monitoring.  He is tolerating gradually increasing activities using standard cardiac surgical rehabilitation protocols.  On postoperative day #2 he was transferred to 4 E. telemetry unit for ongoing recovery.  Incisions are noted to be healing well without evidence of infection.  On  postop day 3 he developed atrial fibrillation.  He subsequently been chemically cardioverted to sinus rhythm on oral amiodarone and his beta-blocker has been uptitrated.  He has been weaned off oxygen.  He is maintaining good saturations on room air.  Incisions are noted to be healing well without evidence of infection.  He is tolerating routine cardiac rehab modalities without difficulty. He was anticoagulated with Coumadin after surgery and the INR was monitored daily. The INR was 1.9 on the day of discharge while taking Coumadin 5mg  daily.  Follow up with the Coumadin Clinic has been arranged.    Consults: None  Significant Diagnostic Studies:   DG Chest 2 View  Result Date: 09/12/2021 CLINICAL DATA:  Preop for valve repair.  Aortic stenosis. EXAM: CHEST - 2 VIEW COMPARISON:  CT 05/16/2021 FINDINGS: Right shoulder arthroplasty. Midline trachea. Normal heart size. The ascending aortic dilatation on CT is not readily apparent. Clear lungs. Mild right apical pleural thickening. IMPRESSION: No active cardiopulmonary disease. Electronically Signed   By: Abigail Miyamoto M.D.   On: 09/12/2021 11:48   DG Chest Port 1 View  Result Date: 09/18/2021 CLINICAL DATA:  Sore  chest, status post aortic valve replacement. EXAM: PORTABLE CHEST 1 VIEW COMPARISON:  Chest radiograph dated September 17, 2021 FINDINGS: The heart is enlarged. Sternotomy wires are intact. Low lung volumes. No large pleural effusion or pneumothorax. Interval removal of the swans Ganz catheter. Right shoulder reverse arthroplasty. IMPRESSION: Low lung volumes .  No large pleural effusion or pneumothorax. Electronically Signed   By: Keane Police D.O.   On: 09/18/2021 08:53   DG Chest Port 1 View  Result Date: 09/17/2021 CLINICAL DATA:  Chest pain EXAM: PORTABLE CHEST 1 VIEW COMPARISON:  Previous studies including the examination of 09/16/2021 FINDINGS: Transverse diameter of heart is increased. There are no signs of pulmonary edema or new focal  infiltrates. There is poor inspiration. There is interval removal of endotracheal tube and enteric tube. Tip of Swan-Ganz catheter is noted in the region of main pulmonary artery. There is possible surgical drain in the mediastinum. There is no pleural effusion or pneumothorax. IMPRESSION: There are no signs of pulmonary edema or new focal infiltrates. Electronically Signed   By: Elmer Picker M.D.   On: 09/17/2021 08:10   DG Chest Port 1 View  Result Date: 09/16/2021 CLINICAL DATA:  Status post aortic valve repair and repair of ascending thoracic aortic aneurysm. EXAM: PORTABLE CHEST 1 VIEW COMPARISON:  September 12, 2021. FINDINGS: The heart size and mediastinal contours are within normal limits. Endotracheal and nasogastric tubes are in good position. Left internal jugular Swan-Ganz catheter is noted with tip in expected position of main pulmonary artery. Both lungs are clear. The visualized skeletal structures are unremarkable. IMPRESSION: Endotracheal and nasogastric tubes are in grossly good position. No pneumothorax is noted. Electronically Signed   By: Marijo Conception M.D.   On: 09/16/2021 14:18   ECHO INTRAOPERATIVE TEE  Result Date: 09/16/2021  *INTRAOPERATIVE TRANSESOPHAGEAL REPORT *  Patient Name:   Gabriel Anderson Date of Exam: 09/16/2021 Medical Rec #:  [id number]        Height:       70.0 in Accession #:    [id number]       Weight:       248.0 lb Date of Birth:  08-06-1971        BSA:          2.29 m Patient Age:    40 years         BP:           108/56 mmHg Patient Gender: M                HR:           64 bpm. Exam Location:  Inpatient Transesophogeal exam was perform intraoperatively during surgical procedure. Patient was closely monitored under general anesthesia during the entirety of examination. Indications:     AVR Performing Phys: [id number] BARTLE Diagnosing Phys: Belinda Block MD Complications: No known complications during this procedure. POST-OP IMPRESSIONS _ Right Ventricle:  The right ventricle appears unchanged from pre-bypass. _ Comments: Post Op AVR Patient came off bypass on the initial attempt. There did not appear to be any obstruction to flow. The valve appeared to be seated well. Left ventricular contraction did improve with volume replacement. The TEE that had been placed after induction uneventfully was removed at the end of the case without difficulty. The patient was taken to to SICU in stable condition. PRE-OP FINDINGS  Left Ventricle: The left ventricle has low normal systolic function, with an ejection fraction of 50-55%. Right  Ventricle: The right ventricle has normal systolic function. Mitral Valve: The mitral valve is normal in structure. Tricuspid Valve: Tricuspid valve regurgitation is mild by color flow Doppler. Aortic Valve: Aortic valve regurgitation is mild by color flow Doppler. There is severe stenosis of the aortic valve.  Belinda Block MD Electronically signed by Belinda Block MD Signature Date/Time: 09/16/2021/4:25:51 PM   Final    VAS US DOPPLER PRE CABG  Result Date: 09/12/2021 PREOPERATIVE VASCULAR EVALUATION Patient Name:  Gabriel Anderson  Date of Exam:   09/12/2021 Medical Rec #: [id number]         Accession #:    [id number] Date of Birth: 1971/08/07         Patient Gender: M Patient Age:   34 years Exam Location:  [facility] Procedure:      VAS US DOPPLER PRE CABG Referring Phys: Gilford Raid --------------------------------------------------------------------------------  Indications:      Pre-CABG. Risk Factors:     Coronary artery disease. Other Factors:    Bicuspid aortic valve. Comparison Study: No prior study Performing Technologist: Maudry Mayhew MHA, RVT, RDCS, RDMS  Examination Guidelines: A complete evaluation includes B-mode imaging, spectral Doppler, color Doppler, and power Doppler as needed of all accessible portions of each vessel. Bilateral testing is considered an integral part of a complete examination. Limited  examinations for reoccurring indications may be performed as noted.  Right Carotid Findings: +----------+--------+--------+--------+-----------------------+--------+             PSV cm/s EDV cm/s Stenosis Describe                Comments  +----------+--------+--------+--------+-----------------------+--------+  CCA Prox   56       17                                                  +----------+--------+--------+--------+-----------------------+--------+  CCA Distal 68       22                                                  +----------+--------+--------+--------+-----------------------+--------+  ICA Prox   43       11                smooth and heterogenous           +----------+--------+--------+--------+-----------------------+--------+  ICA Distal 44       17                                                  +----------+--------+--------+--------+-----------------------+--------+  ECA        66       18                                                  +----------+--------+--------+--------+-----------------------+--------+ +----------+--------+-------+----------------+------------+             PSV cm/s EDV cms Describe         Arm  Pressure  +----------+--------+-------+----------------+------------+  Subclavian 82               Multiphasic, WNL               +----------+--------+-------+----------------+------------+ +---------+--------+--+--------+-+---------+  Vertebral PSV cm/s 34 EDV cm/s 8 Antegrade  +---------+--------+--+--------+-+---------+ Left Carotid Findings: +----------+--------+--------+--------+------------------------------+---------+             PSV cm/s EDV cm/s Stenosis Describe                       Comments   +----------+--------+--------+--------+------------------------------+---------+  CCA Prox   74       24                                                          +----------+--------+--------+--------+------------------------------+---------+  CCA Distal 54       18                                                           +----------+--------+--------+--------+------------------------------+---------+  ICA Prox   77       25                focal, heterogenous, smooth    Shadowing                                         and calcific                              +----------+--------+--------+--------+------------------------------+---------+  ICA Distal 52       18                                                          +----------+--------+--------+--------+------------------------------+---------+  ECA        72       18                                                          +----------+--------+--------+--------+------------------------------+---------+ +----------+--------+--------+----------------+------------+  Subclavian PSV cm/s EDV cm/s Describe         Arm Pressure  +----------+--------+--------+----------------+------------+             99                Multiphasic, WNL               +----------+--------+--------+----------------+------------+ +---------+--------+--+--------+--+---------+  Vertebral PSV cm/s 44 EDV cm/s 17 Antegrade  +---------+--------+--+--------+--+---------+  ABI Findings: +--------+------------------+-----+---------+--------+  Right    Rt Pressure (mmHg) Index Waveform  Comment   +--------+------------------+-----+---------+--------+  Brachial 139  triphasic           +--------+------------------+-----+---------+--------+ +--------+------------------+-----+---------+-------+  Left     Lt Pressure (mmHg) Index Waveform  Comment  +--------+------------------+-----+---------+-------+  Brachial 137                      triphasic          +--------+------------------+-----+---------+-------+  Right Doppler Findings: +-----------+--------+-----+---------+--------------------+  Site        Pressure Index Doppler   Comments              +-----------+--------+-----+---------+--------------------+  Brachial    139            triphasic                        +-----------+--------+-----+---------+--------------------+  Radial                     triphasic                       +-----------+--------+-----+---------+--------------------+  Ulnar                      triphasic                       +-----------+--------+-----+---------+--------------------+  Palmar Arch                          Within normal limits  +-----------+--------+-----+---------+--------------------+  Left Doppler Findings: +-----------+--------+-----+---------+--------------------+  Site        Pressure Index Doppler   Comments              +-----------+--------+-----+---------+--------------------+  Brachial    137            triphasic                       +-----------+--------+-----+---------+--------------------+  Radial                     triphasic                       +-----------+--------+-----+---------+--------------------+  Ulnar                      triphasic                       +-----------+--------+-----+---------+--------------------+  Palmar Arch                          Within normal limits  +-----------+--------+-----+---------+--------------------+  Summary: Right Carotid: Velocities in the right ICA are consistent with a 1-39% stenosis. Left Carotid: Velocities in the left ICA are consistent with a 1-39% stenosis. Vertebrals:  Bilateral vertebral arteries demonstrate antegrade flow. Subclavians: Normal flow hemodynamics were seen in bilateral subclavian              arteries. Right Upper Extremity: Doppler waveforms remain within normal limits with right radial compression. Doppler waveforms remain within normal limits with right ulnar compression. Left Upper Extremity: Doppler waveforms remain within normal limits with left radial compression. Doppler waveforms remain within normal limits with left ulnar compression.  Electronically signed by Jamelle Haring on 09/12/2021 at 4:02:41 PM.    Final      Treatments: surgery:    09/16/2021   Surgeon:  Gaye Pollack, MD    First Assistant: Jadene Pierini,  PA-C: An experienced assistant was required given the complexity of this surgery and the standard of surgical care. The assistant was needed for exposure, dissection, suctioning, retraction of delicate tissues and sutures, instrument exchange and for overall help during this procedure.      Preoperative Diagnosis:  Severe bicuspid aortic stenosis and ascending aortic aneurysm     Postoperative Diagnosis:  Same     Procedure:   Median Sternotomy Extracorporeal circulation 3.   Supra-coronary replacement of the ascending aorta (hemi-arch) using a 28 mm Hemashield graft under deep hypothermic circulatory arrest 4.   Aortic valve replacement using a 23 mm On-X mechanical valve   Anesthesia:  General Endotracheal  Discharge Exam: Blood pressure 125/71, pulse 86, temperature 98.3 F (36.8 C), temperature source Oral, resp. rate 13, height 5\' 10"  (1.778 m), weight 112.2 kg, SpO2 95 %.  General appearance: alert, cooperative, and no distress Heart: regular rate and rhythm with appropriate valve click.  Lungs: clear Abdomen: soft and NT. Extremities:trace pretibial edema Wound: incisions are healing well  Discharge Medications:  The patient has been discharged on:   1.Beta Blocker:  Yes [ x]                              No   [   ]                              If No, reason:  2.Ace Inhibitor/ARB: Yes [   ]                                     No  [ x   ]                                     If No, reason: BP labile, On beta blocker for a-fib.   3.Statin:   Yes [ x  ]                  No  [   ]                  If No, reason:  4.Ecasa:  Yes  [ x  ]                  No   [   ]                  If No, reason:  Patient had ACS upon admission:  Plavix/P2Y12 inhibitor: Yes [   ]                                      No  [  x ]     Discharge Instructions     Amb Referral to Cardiac Rehabilitation   Complete by: As directed    Diagnosis: Valve  Replacement   Valve: Aortic   After initial evaluation and assessments completed: Virtual Based Care may be provided alone or in conjunction with Phase 2 Cardiac Rehab based on patient barriers.: Yes  Allergies as of 09/21/2021   No Known Allergies      Medication List     STOP taking these medications    candesartan 32 MG tablet Commonly known as: ATACAND       TAKE these medications    acetaminophen 500 MG tablet Commonly known as: TYLENOL Take 1,000 mg by mouth every 6 (six) hours as needed for moderate pain.   amiodarone 200 MG tablet Commonly known as: PACERONE Take 2 tablets (400 mg total) by mouth 2 (two) times daily. For 7 days then reduce the dose to 1 tablet (200mg ) twice daily.   amoxicillin 500 MG tablet Commonly known as: AMOXIL Take 2,000 mg by mouth See admin instructions. Take 2000 mg by mouth one hour prior to dental procedures   aspirin EC 81 MG tablet Take 1 tablet (81 mg total) by mouth daily.   metoprolol tartrate 50 MG tablet Commonly known as: LOPRESSOR Take 1 tablet (50 mg total) by mouth 2 (two) times daily.   nitroGLYCERIN 0.4 MG SL tablet Commonly known as: NITROSTAT Place 1 tablet (0.4 mg total) under the tongue every 5 (five) minutes as needed.   rosuvastatin 40 MG tablet Commonly known as: CRESTOR TAKE 1 TABLET BY MOUTH EVERY DAY What changed: when to take this   traMADol 50 MG tablet Commonly known as: ULTRAM Take 1-2 tablets (50-100 mg total) by mouth every 6 (six) hours as needed for up to 5 days for moderate pain.   vitamin B-12 500 MCG tablet Commonly known as: CYANOCOBALAMIN Take 500 mcg by mouth daily.   warfarin 5 MG tablet Commonly known as: COUMADIN Take 1 tablet (5 mg total) by mouth daily at 4 PM. Or as instructed by the Coumadin Clinic.        Follow-up Information     Gaye Pollack, MD. Go on 10/16/2021.   Specialty: Cardiothoracic Surgery Why: Your appointment is at 12 noon.  Please arrive 30 min  early for a chest x-ray to be perfomed by [facility] located on the first floor of the same building. Contact information: [address] [place] [address] [place] [address]. Go on 09/27/2021.   Specialty: Cardiology Why: Appopintment is at 10:30 for follow-up appointment to have blood test for adjusting your Coumadin dose Contact information: [address], [place] [phone]        [facility] [place]. Go on 09/25/2021.   Specialty: Cardiothoracic Surgery Why: Your appointment is at 11am for suture removal. Contact information: [place], [place] [phone]        Loel Dubonnet, NP. Go on 10/03/2021.   Specialty: Cardiology Why: Your appointment is at 2:20pm. Contact information: [address] [place] [address] [phone]                 Signed: Antony Odea, PA-C  09/21/2021, 11:03 AM

## 2021-09-19 LAB — CBC
HCT: 31 % — ABNORMAL LOW (ref 39.0–52.0)
Hemoglobin: 9.9 g/dL — ABNORMAL LOW (ref 13.0–17.0)
MCH: 29.1 pg (ref 26.0–34.0)
MCHC: 31.9 g/dL (ref 30.0–36.0)
MCV: 91.2 fL (ref 80.0–100.0)
Platelets: 182 10*3/uL (ref 150–400)
RBC: 3.4 MIL/uL — ABNORMAL LOW (ref 4.22–5.81)
RDW: 13.1 % (ref 11.5–15.5)
WBC: 18.3 10*3/uL — ABNORMAL HIGH (ref 4.0–10.5)
nRBC: 0 % (ref 0.0–0.2)

## 2021-09-19 LAB — BASIC METABOLIC PANEL
Anion gap: 10 (ref 5–15)
BUN: 7 mg/dL (ref 6–20)
CO2: 30 mmol/L (ref 22–32)
Calcium: 8.3 mg/dL — ABNORMAL LOW (ref 8.9–10.3)
Chloride: 95 mmol/L — ABNORMAL LOW (ref 98–111)
Creatinine, Ser: 0.68 mg/dL (ref 0.61–1.24)
GFR, Estimated: >60 mL/min (ref >60)
Glucose, Bld: 117 mg/dL — ABNORMAL HIGH (ref 70–99)
Potassium: 3.9 mmol/L (ref 3.5–5.1)
Sodium: 135 mmol/L (ref 135–145)

## 2021-09-19 LAB — TSH: TSH: 1.317 u[IU]/mL (ref 0.350–4.500)

## 2021-09-19 LAB — PROTIME-INR
INR: 1.5 — ABNORMAL HIGH (ref 0.8–1.2)
Prothrombin Time: 17.8 seconds — ABNORMAL HIGH (ref 11.4–15.2)

## 2021-09-19 MED ORDER — AMIODARONE HCL 200 MG PO TABS
400.0000 mg | ORAL_TABLET | Freq: Two times a day (BID) | ORAL | Status: DC
  Administered 2021-09-19 – 2021-09-21 (×5): 400 mg via ORAL
  Filled 2021-09-19 (×5): qty 2

## 2021-09-19 MED ORDER — METOPROLOL TARTRATE 25 MG PO TABS
37.5000 mg | ORAL_TABLET | Freq: Two times a day (BID) | ORAL | Status: DC
  Administered 2021-09-19 – 2021-09-21 (×5): 37.5 mg via ORAL
  Filled 2021-09-19 (×5): qty 1

## 2021-09-19 NOTE — Progress Notes (Signed)
CARDIAC REHAB PHASE I   PRE:  Rate/Rhythm: 85 SR    BP: sitting 114/63    SaO2: 95 RA  MODE:  Ambulation: 420 ft   POST:  Rate/Rhythm: 104 ST    BP: sitting 145/72     SaO2: 96 RA  Pt needed mod-max assist to get to EOB but should improve with education. Gave Move in the Tube sheet. Independent walking hall, no c/o, feels well. To recliner. Encouraged 1-2 more walks, can walk with wife. [phone]  [name] CES, ACSM 09/19/2021 12:38 PM

## 2021-09-19 NOTE — Progress Notes (Addendum)
[address]       [place] [address]             [phone]      3 Days Post-Op Procedure(s) (LRB): AORTIC VALVE REPLACEMENT (AVR) USING ON-X 23MM PROSTHETIC VALVE (N/A) REPLACEMENT OF ASCENDING AORTA USING HEMASHIELD PLATINUM 28MM X10MM X50CM GRAFT (N/A) TRANSESOPHAGEAL ECHOCARDIOGRAM (TEE) (N/A) APPLICATION OF CELL SAVER Subjective: In SVT at [id number] , uncertain if afib , broke with valsalva , now in sinus in 90's  Objective: Vital signs in last 24 hours: Temp:  [98 F (36.7 C)-98.8 F (37.1 C)] 98.8 F (37.1 C) (01/12 0400) Pulse Rate:  [73-107] 100 (01/12 0700) Cardiac Rhythm: Normal sinus rhythm (01/12 0400) Resp:  [9-23] 17 (01/12 0700) BP: (117-174)/(61-113) 120/68 (01/12 0700) SpO2:  [87 %-99 %] 95 % (01/12 0700) Weight:  [113.4 kg] 113.4 kg (01/12 0600)  Hemodynamic parameters for last 24 hours:    Intake/Output from previous day: 01/11 0701 - 01/12 0700 In: 1200 [P.O.:1200] Out: 4050 [Urine:4050] Intake/Output this shift: No intake/output data recorded.  General appearance: alert, cooperative, and no distress Heart: regular rate and rhythm Lungs: min dim in bases Abdomen: benign Extremities: trace edema Wound: incis healing well  Lab Results: Recent Labs    09/18/21 0301 09/19/21 0111  WBC 20.0* 18.3*  HGB 10.4* 9.9*  HCT 31.8* 31.0*  PLT 191 182   BMET:  Recent Labs    09/18/21 0301 09/19/21 0111  NA 133* 135  K 4.2 3.9  CL 98 95*  CO2 30 30  GLUCOSE 116* 117*  BUN 10 7  CREATININE 0.90 0.68  CALCIUM 8.3* 8.3*    PT/INR:  Recent Labs    09/19/21 0111  LABPROT 17.8*  INR 1.5*   ABG    Component Value Date/Time   PHART 7.338 (L) 09/16/2021 1827   HCO3 25.3 09/16/2021 1827   TCO2 27 09/16/2021 1827   ACIDBASEDEF 1.0 09/16/2021 1827   O2SAT 99.0 09/16/2021 1827   CBG (last 3)  Recent Labs    09/18/21 0313 09/18/21 0653 09/18/21 1135  GLUCAP 113* 135* 130*    Meds Scheduled Meds:   Chlorhexidine Gluconate Cloth  6 each Topical Daily   furosemide  40 mg Oral Daily   mouth rinse  15 mL Mouth Rinse BID   metoprolol tartrate  25 mg Oral BID   pantoprazole  40 mg Oral QAC breakfast   polyethylene glycol  17 g Oral Daily   potassium chloride  20 mEq Oral Daily   sodium chloride flush  3 mL Intravenous Q12H   warfarin  5 mg Oral q1600   Warfarin - Physician Dosing Inpatient   Does not apply q1600   Continuous Infusions:  sodium chloride     PRN Meds:.sodium chloride, acetaminophen, ondansetron **OR** ondansetron (ZOFRAN) IV, oxyCODONE, senna-docusate, sodium chloride flush, traMADol  Xrays DG Chest Port 1 View  Result Date: 09/18/2021 CLINICAL DATA:  Sore chest, status post aortic valve replacement. EXAM: PORTABLE CHEST 1 VIEW COMPARISON:  Chest radiograph dated September 17, 2021 FINDINGS: The heart is enlarged. Sternotomy wires are intact. Low lung volumes. No large pleural effusion or pneumothorax. Interval removal of the swans Ganz catheter. Right shoulder reverse arthroplasty. IMPRESSION: Low lung volumes .  No large pleural effusion or pneumothorax. Electronically Signed   By: Keane Police D.O.   On: 09/18/2021 08:53    Assessment/Plan: S/P Procedure(s) (LRB): AORTIC VALVE REPLACEMENT (AVR) USING ON-X 23MM PROSTHETIC VALVE (N/A) REPLACEMENT  OF ASCENDING AORTA USING HEMASHIELD PLATINUM 28MM X10MM X50CM GRAFT (N/A) TRANSESOPHAGEAL ECHOCARDIOGRAM (TEE) (N/A) APPLICATION OF CELL SAVER  1 afeb, VSS S BP 117-174, mostly in 110's-130's, SVT- now sinus , will increase beta blocker 2 sats good on 1 liter  3 excellent UOP, weight approaching preop- normal renal fxn 4 reactive leukocytosis, trend improving 5 expected ABLA is very stable 6 BS well controlled 7 INR 1.5 trending up, on 5 mg of coumadin- continue 8 cont rehab/pulm rx    LOS: 3 days    John Giovanni PA-C Pager [phone] 09/19/2021    Chart reviewed, patient examined, agree with above. He looks good  overall. SVT/AF with RVR this am converted with valsalva. Lopressor increased and amio started since he will likely go into AF with that hx.

## 2021-09-19 NOTE — Progress Notes (Signed)
Mobility Specialist: Progress Note   09/19/21 1544  Mobility  Activity Ambulated in hall  Level of Assistance Standby assist, set-up cues, supervision of patient - no hands on  Assistive Device None  Distance Ambulated (ft) 430 ft  Mobility Ambulated independently in hallway  Mobility Response Tolerated well  Mobility performed by Mobility specialist  Bed Position Chair  $Mobility charge 1 Mobility   Pre-Mobility: 90 HR During Mobility: 150 HR Post-Mobility: 91 HR  Pt independent to stand from recliner, no c/o throughout ambulation. Pt back to recliner after walk with call bell in reach and family present in the room.   [facility] Kyani Simkin Mobility Specialist Mobility Specialist 4 [place]: [phone] Mobility Specialist 2 [place] and [place]: [phone]

## 2021-09-20 LAB — PROTIME-INR
INR: 1.8 — ABNORMAL HIGH (ref 0.8–1.2)
Prothrombin Time: 20.8 seconds — ABNORMAL HIGH (ref 11.4–15.2)

## 2021-09-20 MED ORDER — COUMADIN BOOK
Freq: Once | Status: AC
  Filled 2021-09-20: qty 1

## 2021-09-20 MED ORDER — ROSUVASTATIN CALCIUM 20 MG PO TABS
40.0000 mg | ORAL_TABLET | Freq: Every day | ORAL | Status: DC
  Administered 2021-09-20: 40 mg via ORAL
  Filled 2021-09-20: qty 2

## 2021-09-20 NOTE — Progress Notes (Signed)
CARDIAC REHAB PHASE I   PRE:  Rate/Rhythm: 98 SR    BP: sitting 139/78    SaO2: 93 RA  MODE:  Ambulation: 740 ft   POST:  Rate/Rhythm: 121 ST, 150 afib after walk    BP: sitting 147/76     SaO2: 97 RA  Pt in recliner. Stood and ambulated without assist, HR controlled at 120 ST, no c/o. Return to recliner and pt went in to afib 150. Slowly decreased in rate over 5 min then back to NSR. Pt sts he can feel afib.   Discussed with pt and wife IS, sternal precautions, exercise, diet including vit k briefly, and CRPII. Also gave Off the [employer] booklet and discussed afib. Pt and wife receptive. Can ambulate independently. Will refer to [place] CRPII.  [id number]   [name] CES, ACSM 09/20/2021 9:45 AM

## 2021-09-20 NOTE — Plan of Care (Signed)

## 2021-09-20 NOTE — Progress Notes (Signed)
Mobility Specialist: Progress Note   09/20/21 1532  Mobility  Activity Ambulated in hall  Level of Assistance Independent  Assistive Device None  Distance Ambulated (ft) 1000 ft  Mobility Ambulated independently in hallway  Mobility Response Tolerated well  Mobility performed by Mobility specialist  Bed Position Chair  $Mobility charge 1 Mobility   Pre-Mobility: 106 HR During Mobility: 149 HR Post-Mobility: 80 HR  Received pt in chair having no complaints and agreeable to mobility. Asymptomatic throughout ambulation, returned back to chair w/ call bell in reach and all needs met.  [facility] Anae Hams Mobility Specialist Mobility Specialist 4 [place]: [phone] Mobility Specialist 2 [place] and [place]: [phone]

## 2021-09-20 NOTE — Discharge Instructions (Signed)
Information on my medicine - Coumadin   (Warfarin)   Why was Coumadin prescribed for you? Coumadin was prescribed for you because you have a blood clot or a medical condition that can cause an increased risk of forming blood clots. Blood clots can cause serious health problems by blocking the flow of blood to the heart, lung, or brain. Coumadin can prevent harmful blood clots from forming. As a reminder your indication for Coumadin is:  Blood Clot Prevention after Heart Valve Surgery  What test will check on my response to Coumadin? While on Coumadin (warfarin) you will need to have an INR test regularly to ensure that your dose is keeping you in the desired range. The INR (international normalized ratio) number is calculated from the result of the laboratory test called prothrombin time (PT).  If an INR APPOINTMENT HAS NOT ALREADY BEEN MADE FOR YOU please schedule an appointment to have this lab work done by your health care provider within 7 days. Your INR goal is usually a number between:  2 to 3.  Ask your health care provider during an office visit what your goal INR is.  What  do you need to  know  About  COUMADIN? Take Coumadin (warfarin) exactly as prescribed by your healthcare provider about the same time each day.  DO NOT stop taking without talking to the doctor who prescribed the medication.  Stopping without other blood clot prevention medication to take the place of Coumadin may increase your risk of developing a new clot or stroke.  Get refills before you run out.  What do you do if you miss a dose? If you miss a dose, take it as soon as you remember on the same day then continue your regularly scheduled regimen the next day.  Do not take two doses of Coumadin at the same time.  Important Safety Information A possible side effect of Coumadin (Warfarin) is an increased risk of bleeding. You should call your healthcare provider right away if you experience any of the  following: Bleeding from an injury or your nose that does not stop. Unusual colored urine (red or dark brown) or unusual colored stools (red or black). Unusual bruising for unknown reasons. A serious fall or if you hit your head (even if there is no bleeding).  Some foods or medicines interact with Coumadin (warfarin) and might alter your response to warfarin. To help avoid this: Eat a balanced diet, maintaining a consistent amount of Vitamin K. Notify your provider about major diet changes you plan to make. Avoid alcohol or limit your intake to 1 drink for women and 2 drinks for men per day. (1 drink is 5 oz. wine, 12 oz. beer, or 1.5 oz. liquor.)  Make sure that ANY health care provider who prescribes medication for you knows that you are taking Coumadin (warfarin).  Also make sure the healthcare provider who is monitoring your Coumadin knows when you have started a new medication including herbals and non-prescription products.  Coumadin (Warfarin)  Major Drug Interactions  Increased Warfarin Effect Decreased Warfarin Effect  Alcohol (large quantities) Antibiotics (esp. Septra/Bactrim, Flagyl, Cipro) Amiodarone (Cordarone) Aspirin (ASA) Cimetidine (Tagamet) Megestrol (Megace) NSAIDs (ibuprofen, naproxen, etc.) Piroxicam (Feldene) Propafenone (Rythmol SR) Propranolol (Inderal) Isoniazid (INH) Posaconazole (Noxafil) Barbiturates (Phenobarbital) Carbamazepine (Tegretol) Chlordiazepoxide (Librium) Cholestyramine (Questran) Griseofulvin Oral Contraceptives Rifampin Sucralfate (Carafate) Vitamin K   Coumadin (Warfarin) Major Herbal Interactions  Increased Warfarin Effect Decreased Warfarin Effect  Garlic Ginseng Ginkgo biloba Coenzyme Q10 Green tea St.  Johns wort    Coumadin (Warfarin) FOOD Interactions  Eat a consistent number of servings per week of foods HIGH in Vitamin K (1 serving =  cup)  Collards (cooked, or boiled & drained) Kale (cooked, or boiled &  drained) Mustard greens (cooked, or boiled & drained) Parsley *serving size only =  cup Spinach (cooked, or boiled & drained) Swiss chard (cooked, or boiled & drained) Turnip greens (cooked, or boiled & drained)  Eat a consistent number of servings per week of foods MEDIUM-HIGH in Vitamin K (1 serving = 1 cup)  Asparagus (cooked, or boiled & drained) Broccoli (cooked, boiled & drained, or raw & chopped) Brussel sprouts (cooked, or boiled & drained) *serving size only =  cup Lettuce, raw (green leaf, endive, romaine) Spinach, raw Turnip greens, raw & chopped   These websites have more information on Coumadin (warfarin):  FailFactory.se; VeganReport.com.au;

## 2021-09-20 NOTE — Progress Notes (Addendum)
[address]       [place] [address]             [phone]      4 Days Post-Op Procedure(s) (LRB): AORTIC VALVE REPLACEMENT (AVR) USING ON-X PROSTHETIC VALVE (N/A) REPLACEMENT OF ASCENDING AORTA USING HEMASHIELD PLATINUM X10MM X50CM GRAFT (N/A) TRANSESOPHAGEAL ECHOCARDIOGRAM (TEE) (N/A) APPLICATION OF CELL SAVER Subjective: Feels much better today  Objective: Vital signs in last 24 hours: Temp:  [97.7 F (36.5 C)-98.9 F (37.2 C)] 98.4 F (36.9 C) (01/13 0415) Pulse Rate:  [80-98] 81 (01/13 0415) Cardiac Rhythm: Normal sinus rhythm (01/12 1900) Resp:  [12-18] 15 (01/13 0415) BP: (112-130)/(61-70) 119/63 (01/13 0415) SpO2:  [96 %-99 %] 99 % (01/13 0415)  Hemodynamic parameters for last 24 hours:    Intake/Output from previous day: 01/12 0701 - 01/13 0700 In: 203 [P.O.:200; I.V.:3] Out: 2800 [Urine:2800] Intake/Output this shift: No intake/output data recorded.  General appearance: alert, cooperative, and no distress Heart: regular rate and rhythm and + valve click soft systolic murmur Lungs: clear Abdomen: benign Extremities: min edema Wound: incis healing well  Lab Results: Recent Labs    09/18/21 0301 09/19/21 0111  WBC 20.0* 18.3*  HGB 10.4* 9.9*  HCT 31.8* 31.0*  PLT 191 182   BMET:  Recent Labs    09/18/21 0301 09/19/21 0111  NA 133* 135  K 4.2 3.9  CL 98 95*  CO2 30 30  GLUCOSE 116* 117*  BUN 10 7  CREATININE 0.90 0.68  CALCIUM 8.3* 8.3*    PT/INR:  Recent Labs    09/20/21 0440  LABPROT 20.8*  INR 1.8*   ABG    Component Value Date/Time   PHART 7.338 (L) 09/16/2021 1827   HCO3 25.3 09/16/2021 1827   TCO2 27 09/16/2021 1827   ACIDBASEDEF 1.0 09/16/2021 1827   O2SAT 99.0 09/16/2021 1827   CBG (last 3)  Recent Labs    09/18/21 0313 09/18/21 0653 09/18/21 1135  GLUCAP 113* 135* 130*    Meds Scheduled Meds:  amiodarone  400 mg Oral BID   Chlorhexidine Gluconate Cloth  6 each Topical  Daily   furosemide  40 mg Oral Daily   mouth rinse  15 mL Mouth Rinse BID   metoprolol tartrate  37.5 mg Oral BID   pantoprazole  40 mg Oral QAC breakfast   polyethylene glycol  17 g Oral Daily   potassium chloride  20 mEq Oral Daily   sodium chloride flush  3 mL Intravenous Q12H   warfarin  5 mg Oral q1600   Warfarin - Physician Dosing Inpatient   Does not apply q1600   Continuous Infusions:  sodium chloride     PRN Meds:.sodium chloride, acetaminophen, ondansetron **OR** ondansetron (ZOFRAN) IV, oxyCODONE, senna-docusate, sodium chloride flush, traMADol  Xrays No results found.  Assessment/Plan: S/P Procedure(s) (LRB): AORTIC VALVE REPLACEMENT (AVR) USING ON-X PROSTHETIC VALVE (N/A) REPLACEMENT OF ASCENDING AORTA USING HEMASHIELD PLATINUM X10MM X50CM GRAFT (N/A) TRANSESOPHAGEAL ECHOCARDIOGRAM (TEE) (N/A) APPLICATION OF CELL SAVER POD#4  1 afeb. VSS, sinus rhythm, some episodes of AFIB w/RVR- cont amio, lopressor 2 sats good on RA 3 good UOP, not weighed yet, cont diuretics for now, stop soon 4 INR 1.8- cont 5 mg of coumadin 5 TSH 1.317, normal 6 cont rehab/pulm RX 7 prob home in 1-2 days depending on INR and rhythm issues     LOS: 4 days    Rowe Clack PA-C Pager [phone]  [phone] 09/20/2021    Chart reviewed, patient examined, agree with above. He is having intermittent brief episodes of AF with RVR but mostly sinus. Continue Lopressor and amio. INR 1.8 today. I think he can go home tomorrow if no changes and INR continues to rise. Wt is probably at preop and there is no edema. Will stop lasix.

## 2021-09-21 LAB — PROTIME-INR
INR: 1.9 — ABNORMAL HIGH (ref 0.8–1.2)
Prothrombin Time: 21.8 seconds — ABNORMAL HIGH (ref 11.4–15.2)

## 2021-09-21 MED ORDER — AMIODARONE HCL 200 MG PO TABS: 400.0000 mg | ORAL_TABLET | Freq: Two times a day (BID) | ORAL | 2 refills | Status: DC

## 2021-09-21 MED ORDER — METOPROLOL TARTRATE 50 MG PO TABS: 50.0000 mg | ORAL_TABLET | Freq: Two times a day (BID) | ORAL | 2 refills | Status: DC

## 2021-09-21 MED ORDER — WARFARIN SODIUM 5 MG PO TABS: 5.0000 mg | ORAL_TABLET | Freq: Every day | ORAL | 11 refills | Status: DC

## 2021-09-21 MED ORDER — TRAMADOL HCL 50 MG PO TABS
50.0000 mg | ORAL_TABLET | Freq: Four times a day (QID) | ORAL | 0 refills | Status: DC | PRN
Start: 2021-09-21 — End: 2021-09-25

## 2021-09-21 NOTE — Progress Notes (Addendum)
[address]       [place] [address]             [phone]      5 Days Post-Op Procedure(s) (LRB): AORTIC VALVE REPLACEMENT (AVR) USING ON-X PROSTHETIC VALVE (N/A) REPLACEMENT OF ASCENDING AORTA USING HEMASHIELD PLATINUM X10MM X50CM GRAFT (N/A) TRANSESOPHAGEAL ECHOCARDIOGRAM (TEE) (N/A) APPLICATION OF CELL SAVER Subjective:  Feels good. No new concerns. Doing well with mobility.  BM yesterday.  Objective: Vital signs in last 24 hours: Temp:  [98.1 F (36.7 C)-98.5 F (36.9 C)] 98.3 F (36.8 C) (01/14 0800) Pulse Rate:  [76-96] 86 (01/14 0800) Cardiac Rhythm: Normal sinus rhythm (01/14 0354) Resp:  [10-18] 13 (01/14 0800) BP: (116-148)/(64-74) 125/71 (01/14 0800) SpO2:  [93 %-99 %] 95 % (01/14 0800) Weight:  [112.2 kg] 112.2 kg (01/14 0354)  Hemodynamic parameters for last 24 hours:    Intake/Output from previous day: 01/13 0701 - 01/14 0700 In: 960 [P.O.:960] Out: 1650 [Urine:1650] Intake/Output this shift: No intake/output data recorded.  General appearance: alert, cooperative, and no distress Heart: regular rate and rhythm with appropriate valve click.  Lungs: clear Abdomen: soft and NT. Extremities:trace pretibial edema Wound: incis healing well  Lab Results: Recent Labs    09/19/21 0111  WBC 18.3*  HGB 9.9*  HCT 31.0*  PLT 182    BMET:  Recent Labs    09/19/21 0111  NA 135  K 3.9  CL 95*  CO2 30  GLUCOSE 117*  BUN 7  CREATININE 0.68  CALCIUM 8.3*     PT/INR:  Recent Labs    09/21/21 0147  LABPROT 21.8*  INR 1.9*    ABG    Component Value Date/Time   PHART 7.338 (L) 09/16/2021 1827   HCO3 25.3 09/16/2021 1827   TCO2 27 09/16/2021 1827   ACIDBASEDEF 1.0 09/16/2021 1827   O2SAT 99.0 09/16/2021 1827   CBG (last 3)  Recent Labs    09/18/21 1135  GLUCAP 130*     Meds Scheduled Meds:  amiodarone  400 mg Oral BID   Chlorhexidine Gluconate Cloth  6 each Topical Daily   mouth rinse  15 mL  Mouth Rinse BID   metoprolol tartrate  37.5 mg Oral BID   pantoprazole  40 mg Oral QAC breakfast   polyethylene glycol  17 g Oral Daily   rosuvastatin  40 mg Oral q1800   sodium chloride flush  3 mL Intravenous Q12H   warfarin  5 mg Oral q1600   Warfarin - Physician Dosing Inpatient   Does not apply q1600   Continuous Infusions:  sodium chloride     PRN Meds:.sodium chloride, acetaminophen, ondansetron **OR** ondansetron (ZOFRAN) IV, oxyCODONE, senna-docusate, sodium chloride flush, traMADol  Xrays No results found.  Assessment/Plan: S/P Procedure(s) (LRB): AORTIC VALVE REPLACEMENT (AVR) USING ON-X PROSTHETIC VALVE (N/A) REPLACEMENT OF ASCENDING AORTA USING HEMASHIELD PLATINUM X10MM X50CM GRAFT (N/A) TRANSESOPHAGEAL ECHOCARDIOGRAM (TEE) (N/A) APPLICATION OF CELL SAVER POD#4  -POD5 AVR and hemiarch replacement. Progressing well, independent with mobility.  INR 1.9 on Coumadin 5mg  daily.   -Post-op atrial fibrillation- Mostly SR past 24 hours on oral amiodarone. Brief episode of AF with RVR yesterday after ambulation.   -HTN- BP 120's -130 on metoprolol 37.5mg  BID. On candesartan prior to admission.   -Volume excess- Trace LE edema. Lasix stopped.   -Disposition- Plan for discharge today if Dr. agrees.    LOS: 5 days    Laneta Simmers  Nell Range PA-C Pager [phone] 09/21/2021    Chart reviewed, patient examined, agree with above. He feels well. Rhythm is stable on Lopressor and amio. INR 1.9.  Will send home today on Coumadin 5 mg with INR check next week.

## 2021-09-25 ENCOUNTER — Ambulatory Visit (INDEPENDENT_AMBULATORY_CARE_PROVIDER_SITE_OTHER): Payer: Self-pay | Admitting: *Deleted

## 2021-09-25 ENCOUNTER — Other Ambulatory Visit: Payer: Self-pay

## 2021-09-25 ENCOUNTER — Other Ambulatory Visit: Payer: Self-pay | Admitting: *Deleted

## 2021-09-25 ENCOUNTER — Telehealth (HOSPITAL_COMMUNITY): Payer: Self-pay

## 2021-09-25 DIAGNOSIS — Z4802 Encounter for removal of sutures: Secondary | ICD-10-CM

## 2021-09-25 MED ORDER — TRAMADOL HCL 50 MG PO TABS: 50.0000 mg | ORAL_TABLET | Freq: Four times a day (QID) | ORAL | 0 refills | Status: DC | PRN

## 2021-09-25 NOTE — Telephone Encounter (Signed)
Called patient to see if he is interested in the Cardiac Rehab Program. Patient expressed interest. Explained scheduling process and went over insurance, patient verbalized understanding. Will contact patient for scheduling once f/u has been completed.  °

## 2021-09-25 NOTE — Progress Notes (Signed)
Patient arrived for nurse visit to remove sutures post-AVR, replacement of ascending aorta 1/9 by Dr. Cyndia Bent.  Two sutures removed with no signs or symptoms of infection noted.  Incisions well approximated.  Patient tolerated suture removal well.  Patient and family instructed to keep the incision site clean and dry. Patient and family acknowledged instructions given.  All questions answered.    Patient requests refill of Tramadol. States he is currently taking two pills every 6 hrs. States he is incorporating Tylenol into his medication regimen. Provider notified of refill request. Patient will be made aware.

## 2021-09-25 NOTE — Telephone Encounter (Signed)
Pt insurance is active and benefits verified through [employer] Co-pay 0, DED 0/0 met, out of pocket $4,000/$4,000 met, co-insurance 0%. yes pre-authorization required. Passport, [employer] 09/25/2021_0 :14pm, REF# [id number]   Will contact patient to see if he is interested in the Cardiac Rehab Program. If interested, patient will need to complete follow up appt. Once completed, patient will be contacted for scheduling upon review by the RN Navigator.

## 2021-09-27 ENCOUNTER — Ambulatory Visit (INDEPENDENT_AMBULATORY_CARE_PROVIDER_SITE_OTHER): Payer: No Typology Code available for payment source | Admitting: *Deleted

## 2021-09-27 ENCOUNTER — Other Ambulatory Visit: Payer: Self-pay

## 2021-09-27 DIAGNOSIS — Z952 Presence of prosthetic heart valve: Secondary | ICD-10-CM

## 2021-09-27 DIAGNOSIS — Z5181 Encounter for therapeutic drug level monitoring: Secondary | ICD-10-CM | POA: Diagnosis not present

## 2021-09-27 LAB — POCT INR: INR: 5.5 — AB (ref 2.0–3.0)

## 2021-09-27 NOTE — Patient Instructions (Addendum)
Description   Hold warfarin 1/20, 1/21 and 1/22  Then START taking warfarin 1/2 a tablet daily excpet for 1 tablet on Monday, Wednesday and Friday. Recheck INR in 1 week. [facility] Clinic [phone] Main [phone]  A full discussion of the nature of anticoagulants has been carried out.  A benefit risk analysis has been presented to the patient, so that they understand the justification for choosing anticoagulation at this time. The need for frequent and regular monitoring, precise dosage adjustment and compliance is stressed.  Side effects of potential bleeding are discussed.  The patient should avoid any OTC items containing aspirin or ibuprofen, and should avoid great swings in general diet.  Avoid alcohol consumption.  Call if any signs of abnormal bleeding.

## 2021-10-03 ENCOUNTER — Encounter (HOSPITAL_BASED_OUTPATIENT_CLINIC_OR_DEPARTMENT_OTHER): Payer: Self-pay | Admitting: Family

## 2021-10-03 ENCOUNTER — Ambulatory Visit (INDEPENDENT_AMBULATORY_CARE_PROVIDER_SITE_OTHER): Payer: No Typology Code available for payment source | Admitting: Family

## 2021-10-03 ENCOUNTER — Other Ambulatory Visit: Payer: Self-pay

## 2021-10-03 VITALS — BP 118/62 | HR 72 | Ht 70.0 in | Wt 238.0 lb

## 2021-10-03 DIAGNOSIS — I25118 Atherosclerotic heart disease of native coronary artery with other forms of angina pectoris: Secondary | ICD-10-CM | POA: Diagnosis not present

## 2021-10-03 DIAGNOSIS — I350 Nonrheumatic aortic (valve) stenosis: Secondary | ICD-10-CM

## 2021-10-03 DIAGNOSIS — Z952 Presence of prosthetic heart valve: Secondary | ICD-10-CM | POA: Diagnosis not present

## 2021-10-03 DIAGNOSIS — I4891 Unspecified atrial fibrillation: Secondary | ICD-10-CM

## 2021-10-03 DIAGNOSIS — E785 Hyperlipidemia, unspecified: Secondary | ICD-10-CM

## 2021-10-03 DIAGNOSIS — I6523 Occlusion and stenosis of bilateral carotid arteries: Secondary | ICD-10-CM

## 2021-10-03 DIAGNOSIS — I9789 Other postprocedural complications and disorders of the circulatory system, not elsewhere classified: Secondary | ICD-10-CM

## 2021-10-03 MED ORDER — AMIODARONE HCL 200 MG PO TABS: 200.0000 mg | ORAL_TABLET | Freq: Every day | ORAL | 1 refills | Status: DC

## 2021-10-03 NOTE — Progress Notes (Signed)
Office Visit    Patient Name: Gabriel Anderson Date of Encounter: 10/03/2021  PCP:  Kathyrn Lass   [facility]  Cardiologist:  Candee Furbish, MD  Advanced Practice Provider:  No care team member to display Electrophysiologist:  None     Chief Complaint    Gabriel Anderson is a 51 y.o. male with a hx of hypertension, hyperlipidemia, osteoarthritis, coronary artery disease s/p MI with PCI to the LAD XT in 2013, aortic stenosis with bicuspid aortic valve s/p AVR.  Presents today for hospital follow-up  Past Medical History    Past Medical History:  Diagnosis Date   Acute myocardial infarction, subendocardial infarction, initial episode of care [facility]) 2013   Cardiac catheterization 06/02/12-widely patent previously placed LAD stents. Troponin peaked 3. Normal EF.   Anemia    Arthritis    Ascending aortic aneurysm 08/26/2021   4.4 cm on 05/16/21 CT   Coronary atherosclerosis of native coronary artery 05/21/2012   Cardiac catheterization 05/21/12-95% mid LAD lesion at the bifurcation of a large diagonal branch. Normal ejection fraction.   Dyspnea    Heart murmur    aortic stenosis   Hyperlipidemia    Hypertension    Pneumonia    Raynaud's disease    Vasospasm ([place])    Past Surgical History:  Procedure Laterality Date    cardiacstent x 2  2013   AORTIC VALVE REPLACEMENT N/A 09/16/2021   Procedure: AORTIC VALVE REPLACEMENT (AVR) USING ON-X 23MM PROSTHETIC VALVE;  Surgeon: Gaye Pollack, MD;  Location: [place] OR;  Service: Open Heart Surgery;  Laterality: N/A;  Circ Arrest   CARDIAC CATHETERIZATION  05/2012   PCI LAD at takeoff of large diagonal   ELBOW SURGERY Left 1984   LEFT HEART CATHETERIZATION WITH CORONARY ANGIOGRAM N/A 05/21/2012   Procedure: LEFT HEART CATHETERIZATION WITH CORONARY ANGIOGRAM;  Surgeon: Jettie Booze, MD;  Location[facility] C[facility] LAB;  Service: Cardiovascular;  Laterality: N/A;   LEFT HEART CATHETERIZATION WITH CORONARY ANGIOGRAM N/A 06/02/2012    Procedure: LEFT HEART CATHETERIZATION WITH CORONARY ANGIOGRAM;  Surgeon: Candee Furbish, MD;  Location: [facility] CATH LAB;  Service: Cardiovascular;  Laterality: N/A;   PERCUTANEOUS CORONARY STENT INTERVENTION (PCI-S)  05/21/2012   Procedure: PERCUTANEOUS CORONARY STENT INTERVENTION (PCI-S);  Surgeon: Jettie Booze, MD;  Location: [facility] CATH LAB;  Service: Cardiovascular;;   REPLACEMENT ASCENDING AORTA N/A 09/16/2021   Procedure: REPLACEMENT OF ASCENDING AORTA USING HEMASHIELD PLATINUM 28MM X10MM [id number] GRAFT;  Surgeon: Gaye Pollack, MD;  Location: [place];  Service: Open Heart Surgery;  Laterality: N/A;   REVERSE SHOULDER ARTHROPLASTY Right 05/23/2020   Procedure: REVERSE SHOULDER ARTHROPLASTY;  Surgeon: Hiram Gash, MD;  Location: [facility];  Service: Orthopedics;  Laterality: Right;   RIGHT/LEFT HEART CATH AND CORONARY ANGIOGRAPHY N/A 12/19/2020   Procedure: RIGHT/LEFT HEART CATH AND CORONARY ANGIOGRAPHY;  Surgeon: Leonie Man, MD;  Location: [place] CV LAB;  Service: Cardiovascular;  Laterality: N/A;   ROTATOR CUFF REPAIR Right 2009   TEE WITHOUT CARDIOVERSION N/A 09/16/2021   Procedure: TRANSESOPHAGEAL ECHOCARDIOGRAM (TEE);  Surgeon: Gaye Pollack, MD;  Location: [place];  Service: Open Heart Surgery;  Laterality: N/A;   TOTAL HIP ARTHROPLASTY Left 12/30/2013   Procedure: LEFT TOTAL HIP ARTHROPLASTY ANTERIOR APPROACH;  Surgeon: Mcarthur Rossetti, MD;  Location: [facility];  Service: Orthopedics;  Laterality: Left;    Allergies  No Known Allergies  History of Present Illness    Gabriel Anderson is a  51 y.o. male with a hx of hypertension, hyperlipidemia, osteoarthritis, coronary artery disease s/p MI with PCI to the LAD X2 in 2013, aortic stenosis with bicuspid aortic valve s/p AVR last seen while hosptialized.  He had 2D echo February 2020 showing severely calcified valve with mean gradient 39 mmHg, LVEF 55%.  His mean gradient 6 months later was measured at 28 mmHg.  June 2021 mean  gradient measured 34 mmHg.  Echo 12/14/2020 mean gradient 35.3 mmHg.  Cardiac catheterization 12/2020 showed proximal to mid LAD lesion 20% stenosed, mid LAD 20% stenosed.  With widely patent stents.  Ultimately echo 08/09/2021 showed severe arctic stenosis with mean gradient 39.  He had CTA of the chest mental status 22 showing 4.4 cm fusiform ascending aortic aneurysm that appeared to be in the just beyond the sinotubular junction and extended into the distal ascending aorta.  He was symptomatic with exertional fatigue and shortness of breath.  He wished to delay surgery until after his son's senior football season.  He was admitted 09/16/2021 for aortic valve replacement with 23 mm On-X mechanical valve and repair of ascending thoracic aneurysm with Hemashield graft.  He developed atrial fibrillation on postop day 3 and was chemically converted on oral amiodarone.  He presents today for follow-up with his wife Danae Chen.  He does landscaping for living but has really been out of work since August due to severe aortic stenosis.  He reports incisional discomfort since discharge for which she has been taking tramadol about every 6 hours-educated to contact surgical team if he needs refill.  Encouraged him to take Tylenol on a routine schedule twice per day.  He is walking daily for exercise as well as walking stairs in his home.  Does note some mood changes and difficulty sleeping due to dreaming.  He has previously had counseling and thinks he may be helpful again.  We reviewed his medications in detail.  He has had no palpitations since hospital discharge suggestive of recurrent atrial fibrillation.  EKGs/Labs/Other Studies Reviewed:   The following studies were reviewed today:  Doppler Carotid/Arms 09/12/21 Summary:  Right Carotid: Velocities in the right ICA are consistent with a 1-39%  stenosis.   Left Carotid: Velocities in the left ICA are consistent with a 1-39%  stenosis.  Vertebrals: Bilateral  vertebral arteries demonstrate antegrade flow.  Subclavians: Normal flow hemodynamics were seen in bilateral subclavian               arteries.   Right Upper Extremity: Doppler waveforms remain within normal limits with  right radial compression. Doppler waveforms remain within normal limits  with right ulnar compression.  Left Upper Extremity: Doppler waveforms remain within normal limits with  left radial compression. Doppler waveforms remain within normal limits  with left ulnar compression.   Echo 08/19/21  1. There is severe aortic stenosis with AVA 0.75cm2, mean gradient  15mmHg, peak gradient 53mmHg, Vmax 4.20m/s, DI 0.21.   2. Left ventricular ejection fraction, by estimation, is 50 to 55%. The  left ventricle has low normal function. The left ventricle has no regional  wall motion abnormalities. There is severe hypertrophy of the basal  septum. The rest of the LV segments  demonstrate mild-to-moderate left ventricular hypertrophy. Left  ventricular diastolic parameters are consistent with Grade II diastolic  dysfunction (pseudonormalization).   3. Right ventricular systolic function is normal. The right ventricular  size is normal.   4. The mitral valve is normal in structure. Trivial mitral valve  regurgitation.  5. The aortic valve is calcified. There is severe calcifcation of the  aortic valve. There is severe thickening of the aortic valve. Aortic valve  regurgitation is mild. Severe aortic valve stenosis.   6. Aortic dilatation noted. There is mild dilatation of the aortic root,  measuring 38 mm. There is borderline dilatation of the ascending aorta,  measuring 36 mm.   7. The inferior vena cava is normal in size with greater than 50%  respiratory variability, suggesting right atrial pressure of 3 mmHg.   Comparison(s): Compared to prior TTE in 12/14/20, there is now severe AS  with mean gradient 72mmHg, Vmax 4.13m/s, AVA 0.75cm2 (previously 41mmHg AVA  1.06cm2).    [facility] 12/2020 Prox LAD to Mid LAD lesion is 20% stenosed. Mid LAD lesion is 20% stenosed. LV end diastolic pressure is moderately elevated. There is moderate aortic valve stenosis.   POST-OPERATIVE DIAGNOSIS:   Widely patent LAD stent with maybe 20% prestent stenosis and 20% at the distal edge of the stent.  Otherwise, no significant CAD other than long 30% proximal RCA stenosis. Discrepant cardiac output by Fick and thermodilution: Fick CO-CI 6.87-3; thermodilution 5.61-2.45 Right Heart Cath Pressures:  RA mean 7 mmHg RVP-EDP: 32/2 mmHg-9 mmHg;  PCWP 12 mmHg; PA P 28/6 mmHg-mean 13 mmHg. LVP-EDP: 174/5 mmHg - 22 mmHg; AoP - MAP: 141/73 mmHg-100 mmHg. Aortic valve gradients: P-P 33 mmHg, mean 41.9 mm.  AVA estimated between 0.95 and 1.16 cm -> also discrepant findings, would suggest moderate to severe AS.     RECOMMENDATIONS Discharge home and follow-up with Dr. Marlou Porch.   Based on symptoms, and echocardiographic evidence, he may be at the point where it meets criteria for consideration for valve placement.  Borderline pressure readings, but AVA estimations are not severe.  EKG:  EKG is ordered today.  The ekg ordered today demonstrates NSR 72 bpm with first degree AV block with PR 246.  Recent Labs: 09/12/2021: ALT 37 09/17/2021: Magnesium 2.3 09/19/2021: BUN 7; Creatinine, Ser 0.68; Hemoglobin 9.9; Platelets 182; Potassium 3.9; Sodium 135; TSH 1.317  Recent Lipid Panel    Component Value Date/Time   CHOL 109 03/27/2020 1111   CHOL 151 10/26/2018 0959   TRIG 76 03/27/2020 1111   HDL 42 03/27/2020 1111   HDL 44 10/26/2018 0959   CHOLHDL 2.6 03/27/2020 1111   VLDL 16.0 02/12/2015 0909   LDLCALC 51 03/27/2020 1111   LDLDIRECT 150.0 09/19/2013 1050    Home Medications   Current Meds  Medication Sig   acetaminophen (TYLENOL) 500 MG tablet Take 1,000 mg by mouth every 6 (six) hours as needed for moderate pain.   amoxicillin (AMOXIL) 500 MG tablet Take 2,000 mg by mouth See  admin instructions. Take 2000 mg by mouth one hour prior to dental procedures   aspirin 81 MG tablet Take 1 tablet (81 mg total) by mouth daily.   Metoprolol Tartrate (LOPRESSOR) 50 MG tablet Take 1 tablet (50 mg total) by mouth 2 (two) times daily.   nitroGLYCERIN (NITROSTAT) 0.4 MG SL tablet Place 1 tablet (0.4 mg total) under the tongue every 5 (five) minutes as needed.   rosuvastatin (CRESTOR) 40 MG tablet TAKE 1 TABLET BY MOUTH EVERY DAY (Patient taking differently: Take 40 mg by mouth every evening.)   traMADol (ULTRAM) 50 MG tablet Take 1 tablet (50 mg total) by mouth every 6 (six) hours as needed for moderate pain.   vitamin B-12 (CYANOCOBALAMIN) 500 MCG tablet Take 500 mcg by mouth daily.   warfarin (  COUMADIN) 5 MG tablet Take 1 tablet (5 mg total) by mouth daily at 4 PM. Or as instructed by the Coumadin Clinic.   [DISCONTINUED] amiodarone (PACERONE) 200 MG tablet Take 2 tablets (400 mg total) by mouth 2 (two) times daily. For 7 days then reduce the dose to 1 tablet (200mg ) twice daily. (Patient taking differently: Take 200 mg by mouth 2 (two) times daily. For 7 days then reduce the dose to 1 tablet (200mg ) twice daily.)     Review of Systems      All other systems reviewed and are otherwise negative except as noted above.  Physical Exam    VS:  BP 118/62 (BP Location: Left Arm, Patient Position: Sitting, Cuff Size: Normal)    Pulse 72    Ht 5\' 10"  (1.778 m)    Wt 238 lb (108 kg)    BMI 34.15 kg/m  , BMI Body mass index is 34.15 kg/m.  Wt Readings from Last 3 Encounters:  10/03/21 238 lb (108 kg)  09/21/21 247 lb 4.8 oz (112.2 kg)  09/12/21 248 lb (112.5 kg)     GEN: Well nourished, well developed, in no acute distress. HEENT: normal. Neck: Supple, no JVD, carotid bruits, or masses. Cardiac: RRR, mechanical valve click noted. no murmurs, rubs, or gallops. No clubbing, cyanosis, edema.  Radials/PT 2+ and equal bilaterally.  Respiratory:  Respirations regular and unlabored,  clear to auscultation bilaterally. GI: Soft, nontender, nondistended. MS: No deformity or atrophy. Skin: Warm and dry, no rash. Midsternal incision clean, dry, intact with approximated edges and no signs of infection.  Neuro:  Strength and sensation are intact. Psych: Normal affect.  Assessment & Plan    Bicuspid aortic valve s/p mechanical AVR / Chronic anticoagulation -surgical incisions healing appropriately.  Still with incisional soreness.  If needed tramadol refill to be requested from surgical team.  Encouraged him to take Tylenol on a schedule twice per day.   He has echocardiogram upcoming next month for monitoring after surgery.   No palpitations since discharge.  We will reduce amiodarone to 200 mg daily.  He may stop amiodarone 12/01/2021 as he will be 2 months from surgery.   Continue to follow with Coumadin clinic.   BMP, CBC today for monitoring. Refer to Dr. Elias Else, PsyD for stress regarding cardiac conditions.   HLD - Continue Crestor 40mg  QD. Direct LDL today for monitoring.  Denies myalgias  CAD - s/p DES to LAD in 2013. Stable with no anginal symptoms. No indication for ischemic evaluation.  GDMT includes Metoprolol, Crestor. No aspirin due to anticoagulation. Heart healthy diet and regular cardiovascular exercise encouraged.    Bilateral carotid artery stenosis -carotid artery duplex 09/2021 bilateral 1 to 39% stenosis.  Continue statin.  No aspirin due to chronic anticoagulation.  Periodic monitoring as clinically indicated.  Cardiac Rehabilitation Eligibility Assessment  The patient is ready to start cardiac rehabilitation pending clearance from the cardiac surgeon.  Disposition: Follow up in 3 month(s) with Candee Furbish, MD or APP.  Signed, Loel Dubonnet, NP 10/03/2021, 4:38 PM [facility]

## 2021-10-03 NOTE — Patient Instructions (Addendum)
Medication Instructions:  Your physician has recommended you make the following change in your medication:   REDUCE Your Amiodarone to one 200mg  tablet daily  On 12/01/21 you may stop your Amiodarone  *If you need a refill on your cardiac medications before your next appointment, please call your pharmacy*   Lab Work: Your physician recommends that you return for lab work today: BMP, CBC, direct LDL  If you have labs (blood work) drawn today and your tests are completely normal, you will receive your results only by: MyChart Message (if you have MyChart) OR A paper copy in the mail If you have any lab test that is abnormal or we need to change your treatment, we will call you to review the results.   Testing/Procedures: Your EKG today showed sinus rhythm which is a good result.   Your echocardiogram is scheduled for 11/04/21.    Follow-Up: At [facility], you and your health needs are our priority.  As part of our continuing mission to provide you with exceptional heart care, we have created designated Provider Care Teams.  These Care Teams include your primary Cardiologist (physician) and Advanced Practice Providers (APPs -  Physician Assistants and Nurse Practitioners) who all work together to provide you with the care you need, when you need it.  We recommend signing up for the patient portal called "MyChart".  Sign up information is provided on this After Visit Summary.  MyChart is used to connect with patients for Virtual Visits (Telemedicine).  Patients are able to view lab/test results, encounter notes, upcoming appointments, etc.  Non-urgent messages can be sent to your provider as well.   To learn more about what you can do with MyChart, go to [facility].    Your next appointment:   3 month(s)  The format for your next appointment:   In Person  Provider:   [email or web address], MD or Donato Schultz, NP     Other Instructions   Per your surgical team after  open heart surgery you should not push/pull for 3 months. You should not lift more than 20 lbs until 3 months after surgery. If your surgeon has cleared you to drive, you should take breaks to mobilize every 1-2 hours if on a long trip.   Heart Healthy Diet Recommendations: A low-salt diet is recommended. Meats should be grilled, baked, or boiled. Avoid fried foods. Focus on lean protein sources like fish or chicken with vegetables and fruits. The American Heart Association is a [place] [place]!  American Heart Association Diet and Lifeystyle Recommendations    Exercise recommendations: The American Heart Association recommends 150 minutes of moderate intensity exercise weekly. Try 30 minutes of moderate intensity exercise 4-5 times per week. This could include walking, jogging, or swimming.   You have been referred to psychology today. Mental health is an important part of heart health. You have been referred to Dr. Chief Technology Officer. He is located at [facility] Primary care at [facility]. They will contact you to schedule an appointment. If you have not heard from them in 1 week, you may call [phone] to schedule an appointment.

## 2021-10-04 ENCOUNTER — Ambulatory Visit (INDEPENDENT_AMBULATORY_CARE_PROVIDER_SITE_OTHER): Payer: No Typology Code available for payment source

## 2021-10-04 DIAGNOSIS — Z5181 Encounter for therapeutic drug level monitoring: Secondary | ICD-10-CM

## 2021-10-04 DIAGNOSIS — Z952 Presence of prosthetic heart valve: Secondary | ICD-10-CM

## 2021-10-04 LAB — BASIC METABOLIC PANEL
BUN/Creatinine Ratio: 15 (ref 9–20)
BUN: 15 mg/dL (ref 6–24)
CO2: 24 mmol/L (ref 20–29)
Calcium: 10.2 mg/dL (ref 8.7–10.2)
Chloride: 97 mmol/L (ref 96–106)
Creatinine, Ser: 1.01 mg/dL (ref 0.76–1.27)
Glucose: 99 mg/dL (ref 70–99)
Potassium: 5.4 mmol/L — ABNORMAL HIGH (ref 3.5–5.2)
Sodium: 140 mmol/L (ref 134–144)
eGFR: 89 mL/min/{1.73_m2} (ref >59)

## 2021-10-04 LAB — POCT INR: INR: 1.8 — AB (ref 2.0–3.0)

## 2021-10-04 LAB — CBC
Hematocrit: 37.4 % — ABNORMAL LOW (ref 37.5–51.0)
Hemoglobin: 12.1 g/dL — ABNORMAL LOW (ref 13.0–17.7)
MCH: 28.1 pg (ref 26.6–33.0)
MCHC: 32.4 g/dL (ref 31.5–35.7)
MCV: 87 fL (ref 79–97)
Platelets: 768 10*3/uL — ABNORMAL HIGH (ref 150–450)
RBC: 4.3 x10E6/uL (ref 4.14–5.80)
RDW: 13 % (ref 11.6–15.4)
WBC: 13.9 10*3/uL — ABNORMAL HIGH (ref 3.4–10.8)

## 2021-10-04 LAB — LDL CHOLESTEROL, DIRECT: LDL Direct: 71 mg/dL (ref 0–99)

## 2021-10-04 NOTE — Patient Instructions (Signed)
Description   Take 1.5 tablets today and then continue taking warfarin 1/2 a tablet daily excpet for 1 tablet on Monday, Wednesday and Friday. Recheck INR in 1 week.   Anticoagulation Clinic [phone] Main [phone]

## 2021-10-05 LAB — COVID-19, FLU A+B NAA

## 2021-10-07 ENCOUNTER — Telehealth: Payer: Self-pay

## 2021-10-07 NOTE — Telephone Encounter (Signed)
Transition Care Management Follow-up Telephone Call Date of discharge and from where: 09/21/21 from [facility] How have you been since you were released from the hospital? "ups and downs" improving Any questions or concerns? No  Items Reviewed: Did the pt receive and understand the discharge instructions provided? Yes  Medications obtained and verified? Yes  Other? No  Any new allergies since your discharge? No  Dietary orders reviewed? Yes Do you have support at home? Yes   Home Care and Equipment/Supplies: Were home health services ordered? no If so, what is the name of the agency? Not applicable  Has the agency set up a time to come to the patient's home? not applicable Were any new equipment or medical supplies ordered?  No What is the name of the medical supply agency? Not applicable Were you able to get the supplies/equipment? not applicable Do you have any questions related to the use of the equipment or supplies? No  Functional Questionnaire: (I = Independent and D = Dependent) ADLs: I  Bathing/Dressing- I  Meal Prep- I  Eating- I  Maintaining continence- I  Transferring/Ambulation- I  Managing Meds- I  Follow up appointments reviewed:  PCP Hospital f/u appt confirmed? No. Does not have primary care provider. Encouraged to find and establish care with a PCP. Provided contact number [phone] to find a physician. Also instructed insurance provider is a resource to find a provider. Mr. Kuzel reports he already has the contact numbers and is in the process of selecting a primary care provider. Specialist Hospital f/u appt confirmed? Yes  Has followed up with cardiology Gillian Shields, NP and is Scheduled to see Dr. Laneta Simmers on 10/11/21 @ 12N; 11/01/21 Dr. Bosie Clos at 9:00 am, 11/04/21 echo at 8:35 am.. Are transportation arrangements needed? No  If their condition worsens, is the pt aware to call PCP or go to the Emergency Dept.? Yes Was the patient  provided with contact information for the PCP's office or ED? Yes Was to pt encouraged to call back with questions or concerns? Yes   Kathyrn Sheriff, RN, MSN, BSN, CCM [facility] Care Management Coordinator [phone]

## 2021-10-11 ENCOUNTER — Other Ambulatory Visit: Payer: Self-pay

## 2021-10-11 ENCOUNTER — Ambulatory Visit (INDEPENDENT_AMBULATORY_CARE_PROVIDER_SITE_OTHER): Payer: No Typology Code available for payment source | Admitting: *Deleted

## 2021-10-11 DIAGNOSIS — Z952 Presence of prosthetic heart valve: Secondary | ICD-10-CM | POA: Diagnosis not present

## 2021-10-11 DIAGNOSIS — Z5181 Encounter for therapeutic drug level monitoring: Secondary | ICD-10-CM | POA: Diagnosis not present

## 2021-10-11 LAB — POCT INR: INR: 2.1 (ref 2.0–3.0)

## 2021-10-11 NOTE — Patient Instructions (Signed)
Description   START taking warfarin 1 tablet daily except for 1/2 a tablet on Tuesday, Thursday, and Saturday. Recheck INR in 1 week.   Anticoagulation Clinic [phone] Main [phone]

## 2021-10-14 ENCOUNTER — Encounter (HOSPITAL_BASED_OUTPATIENT_CLINIC_OR_DEPARTMENT_OTHER): Payer: Self-pay

## 2021-10-16 ENCOUNTER — Ambulatory Visit
Admission: RE | Admit: 2021-10-16 | Discharge: 2021-10-16 | Disposition: A | Discharge status: Home / Self Care | Payer: No Typology Code available for payment source | Source: Ambulatory Visit | Attending: Surgery | Admitting: Surgery

## 2021-10-16 ENCOUNTER — Other Ambulatory Visit: Payer: Self-pay

## 2021-10-16 ENCOUNTER — Other Ambulatory Visit: Payer: Self-pay | Admitting: Surgery

## 2021-10-16 ENCOUNTER — Ambulatory Visit (INDEPENDENT_AMBULATORY_CARE_PROVIDER_SITE_OTHER): Payer: Self-pay | Admitting: Surgery

## 2021-10-16 VITALS — BP 132/76 | HR 65 | Resp 20 | Ht 70.0 in | Wt 240.0 lb

## 2021-10-16 DIAGNOSIS — Z09 Encounter for follow-up examination after completed treatment for conditions other than malignant neoplasm: Secondary | ICD-10-CM

## 2021-10-16 DIAGNOSIS — I7121 Aneurysm of the ascending aorta, without rupture: Secondary | ICD-10-CM

## 2021-10-16 DIAGNOSIS — I350 Nonrheumatic aortic (valve) stenosis: Secondary | ICD-10-CM

## 2021-10-16 NOTE — Progress Notes (Signed)
HPI: Patient returns for routine postoperative follow-up having undergone AVR using a 23 mm On-X mechanical valve and supra-coronary replacement of an ascending aortic aneurysm on 09/16/2021. The patient's early postoperative recovery while in the hospital was notable for an uncomplicated postop course. Since hospital discharge the patient reports that he has been feeling much better than preop. He is walking daily without chest pain or shortness of breath and has lost some weight. His INR has been followed in the Coumadin clinic and was 2.5 on 10/18/2021.   Current Outpatient Medications  Medication Sig Dispense Refill   acetaminophen (TYLENOL) 500 MG tablet Take 1,000 mg by mouth every 6 (six) hours as needed for moderate pain.     amiodarone (PACERONE) 200 MG tablet Take 1 tablet (200 mg total) by mouth daily. 30 tablet 1   amoxicillin (AMOXIL) 500 MG tablet Take 2,000 mg by mouth See admin instructions. Take 2000 mg by mouth one hour prior to dental procedures     aspirin 81 MG tablet Take 1 tablet (81 mg total) by mouth daily.     Metoprolol Tartrate (LOPRESSOR) 50 MG tablet Take 1 tablet (50 mg total) by mouth 2 (two) times daily. 60 tablet 2   nitroGLYCERIN (NITROSTAT) 0.4 MG SL tablet Place 1 tablet (0.4 mg total) under the tongue every 5 (five) minutes as needed. 25 tablet 6   rosuvastatin (CRESTOR) 40 MG tablet TAKE 1 TABLET BY MOUTH EVERY DAY (Patient taking differently: Take 40 mg by mouth every evening.) 90 tablet 1   traMADol (ULTRAM) 50 MG tablet Take 1 tablet (50 mg total) by mouth every 6 (six) hours as needed for moderate pain. 28 tablet 0   vitamin B-12 (CYANOCOBALAMIN) 500 MCG tablet Take 500 mcg by mouth daily.     warfarin (COUMADIN) 5 MG tablet Take 1 tablet (5 mg total) by mouth daily at 4 PM. Or as instructed by the Coumadin Clinic. 30 tablet 11   No current facility-administered medications for this visit.    Physical Exam: BP 132/76    Pulse 65    Resp 20    Ht  5\' 10"  (1.778 m)    Wt 240 lb (108.9 kg)    SpO2 96% Comment: RA   BMI 34.44 kg/m  He looks well Cardiac exam shows a regular rate and rhythm with crisp mechanical valve sounds. There is no murmur. Lungs are clear The incision is healing well and the sternum is stable. There is no peripheral edema.  Diagnostic Tests:  Narrative & Impression  CLINICAL DATA:  Aortic valve replacement.   EXAM: CHEST - 2 VIEW   COMPARISON:  Portable chest 09/18/2021.   FINDINGS: The heart has decreased to normal size since the prior study. Intact median sternotomy sutures are again noted. There is an aortic valve prosthesis and old LAD coronary artery stenting.   No vascular congestion is evident. No pleural effusion is seen. Stable mediastinum. The lungs are clear. A reverse right shoulder arthroplasty is again shown.   IMPRESSION: No active cardiopulmonary disease.  Aortic valve replacement.     Electronically Signed   By: Telford Nab M.D.   On: 10/17/2021 05:35      Impression:  He is doing well following surgery. I told him that he could return to driving. I asked him not to do any heavy lifting for 3 months postop. He will continue to follow up in the Coumadin clinic for INR management. He has an On-X mechanical aortic  valve and after 3 months his INR goal can be dropped to 1.5-2.0 with addition of ASA 81 mg.  Plan:  I will see him back in 2 months.   Gaye Pollack, MD [facility] [phone]

## 2021-10-18 ENCOUNTER — Ambulatory Visit (INDEPENDENT_AMBULATORY_CARE_PROVIDER_SITE_OTHER): Payer: No Typology Code available for payment source

## 2021-10-18 ENCOUNTER — Other Ambulatory Visit: Payer: Self-pay

## 2021-10-18 DIAGNOSIS — Z5181 Encounter for therapeutic drug level monitoring: Secondary | ICD-10-CM

## 2021-10-18 DIAGNOSIS — Z952 Presence of prosthetic heart valve: Secondary | ICD-10-CM

## 2021-10-18 LAB — POCT INR: INR: 2.5 (ref 2.0–3.0)

## 2021-10-18 NOTE — Patient Instructions (Signed)
-   continue taking warfarin 1 tablet daily except for 1/2 a tablet on Tuesday, Thursday, and Saturday.  - Recheck INR in 2 weeks.   Anticoagulation Clinic [phone] Main [phone]

## 2021-11-01 ENCOUNTER — Other Ambulatory Visit: Payer: Self-pay

## 2021-11-01 ENCOUNTER — Ambulatory Visit (INDEPENDENT_AMBULATORY_CARE_PROVIDER_SITE_OTHER): Payer: No Typology Code available for payment source | Admitting: Psychologist

## 2021-11-01 ENCOUNTER — Ambulatory Visit (INDEPENDENT_AMBULATORY_CARE_PROVIDER_SITE_OTHER): Payer: No Typology Code available for payment source

## 2021-11-01 DIAGNOSIS — Z952 Presence of prosthetic heart valve: Secondary | ICD-10-CM | POA: Diagnosis not present

## 2021-11-01 DIAGNOSIS — Z5181 Encounter for therapeutic drug level monitoring: Secondary | ICD-10-CM | POA: Diagnosis not present

## 2021-11-01 DIAGNOSIS — F330 Major depressive disorder, recurrent, mild: Secondary | ICD-10-CM | POA: Diagnosis not present

## 2021-11-01 LAB — POCT INR: INR: 2.1 (ref 2.0–3.0)

## 2021-11-01 NOTE — Progress Notes (Signed)
                Levone Otten, PsyD 

## 2021-11-01 NOTE — Patient Instructions (Signed)
-   continue taking warfarin 1 tablet daily except for 1/2 a tablet on Tuesday, Thursday, and Saturday.  - Recheck INR in 3 weeks.   Anticoagulation Clinic [phone] Main [phone]

## 2021-11-01 NOTE — Plan of Care (Signed)

## 2021-11-01 NOTE — Progress Notes (Signed)
[place] Counselor Initial Adult Exam  Name: Gabriel Anderson Date: 11/01/2021 MRN: [id number] DOB: [birth year] PCP: Pcp, No  Time spent: 9:07 am to 9:45 am; total time: 38 minutes  This session was held via in person. The patient consented to in-person therapy and was in the clinician's office. Limits of confidentiality were discussed with the patient.   Guardian/Payee:  NA    Paperwork requested: No   Reason for Visit /Presenting Problem: Depression secondary to heart condition.   Mental Status Exam: Appearance:   Casual     Behavior:  Appropriate  Motor:  Normal  Speech/Language:   Clear and Coherent  Affect:  Appropriate  Mood:  normal  Thought process:  normal  Thought content:    WNL  Sensory/Perceptual disturbances:    WNL  Orientation:  oriented to person, place, and time/date  Attention:  Good  Concentration:  Good  Memory:  WNL  Fund of knowledge:   Good  Insight:    Good  Judgment:   Good  Impulse Control:  Good    Reported Symptoms:  The patient endorsed experiencing the following: feeling down, sad, social isolation, fatigue, lack of motivation, rumination of negative thoughts, and low self-esteem. He denied suicidal and homicidal ideation.   Risk Assessment: Danger to Self:  No Self-injurious Behavior: No Danger to Others: No Duty to Warn:no Physical Aggression / Violence:No  Access to Firearms a concern: No  Gang Involvement:No  Patient / guardian was educated about steps to take if suicide or homicide risk level increases between visits: n/a While future psychiatric events cannot be accurately predicted, the patient does not currently require acute inpatient psychiatric care and does not currently meet [facility] involuntary commitment criteria.  Substance Abuse History: Current substance abuse:  Patient described himself as smoking cannabis regularly. He also voiced that he is drinking and indicated that he has significantly  decreased his alcohol consumption.      Past Psychiatric History:   Previous psychological history is significant for depression Outpatient Providers:Gabriel Anderson at the [facility] History of Psych Hospitalization: No  Psychological Testing:  NA    Abuse History:  Victim of: No.,  NA    Report needed: No. Victim of Neglect:No. Perpetrator of  NA   Witness / Exposure to Domestic Violence: No   Protective Services Involvement: No  Witness to [employer] Violence:  No   Family History:  Family History  Problem Relation Age of Onset   Hypertension Father    Diabetes Father    Heart failure Father    Heart disease Father    Stroke Sister    COPD Mother     Living situation: the patient lives with their family  Sexual Orientation: Straight  Relationship Status: married  Name of spouse / other:[name]. They have been married for 20  years. Been together for 30  years.  If a parent, number of children / ages:Two sons who are 87 and 15 years old.   Support Systems: spouse friends  Museum/gallery curator Stress:  No   Income/Employment/Disability: Employment  Armed forces logistics/support/administrative officer: No   Educational History: Education: high school diploma/GED  Religion/Sprituality/World View: Denied  Any cultural differences that may affect / interfere with treatment:  not applicable   Recreation/Hobbies: Fishing  Stressors: Health problems    Strengths: Supportive Relationships and Family  Barriers:  NA   Legal History: Pending legal issue / charges: The patient has no significant history of legal issues. History of legal issue / charges:  NA  Medical History/Surgical History: reviewed Past Medical History:  Diagnosis Date   Acute myocardial infarction, subendocardial infarction, initial episode of care [facility]) 2013   Cardiac catheterization 06/02/12-widely patent previously placed LAD stents. Troponin peaked 3. Normal EF.   Anemia    Arthritis    Ascending aortic aneurysm 08/26/2021   4.4 cm  on 05/16/21 CT   Coronary atherosclerosis of native coronary artery 05/21/2012   Cardiac catheterization 05/21/12-95% mid LAD lesion at the bifurcation of a large diagonal branch. Normal ejection fraction.   Dyspnea    Heart murmur    aortic stenosis   Hyperlipidemia    Hypertension    Pneumonia    Raynaud's disease    Vasospasm ([place])     Past Surgical History:  Procedure Laterality Date    cardiacstent x 2  2013   AORTIC VALVE REPLACEMENT N/A 09/16/2021   Procedure: AORTIC VALVE REPLACEMENT (AVR) USING ON-X 23MM PROSTHETIC VALVE;  Surgeon: Gabriel Pollack, MD;  Location: [place] [facility];  Service: Open Heart Surgery;  Laterality: N/A;  Circ Arrest   CARDIAC CATHETERIZATION  05/2012   PCI LAD at takeoff of large diagonal   ELBOW SURGERY Left 1984   LEFT HEART CATHETERIZATION WITH CORONARY ANGIOGRAM N/A 05/21/2012   Procedure: LEFT HEART CATHETERIZATION WITH CORONARY ANGIOGRAM;  Surgeon: Gabriel Booze, MD;  Location: [facility] CATH LAB;  Service: Cardiovascular;  Laterality: N/A;   LEFT HEART CATHETERIZATION WITH CORONARY ANGIOGRAM N/A 06/02/2012   Procedure: LEFT HEART CATHETERIZATION WITH CORONARY ANGIOGRAM;  Surgeon: Gabriel Furbish, MD;  Location: [facility] CATH LAB;  Service: Cardiovascular;  Laterality: N/A;   PERCUTANEOUS CORONARY STENT INTERVENTION (PCI-S)  05/21/2012   Procedure: PERCUTANEOUS CORONARY STENT INTERVENTION (PCI-S);  Surgeon: Gabriel Booze, MD;  Location: [facility] CATH LAB;  Service: Cardiovascular;;   REPLACEMENT ASCENDING AORTA N/A 09/16/2021   Procedure: REPLACEMENT OF ASCENDING AORTA USING HEMASHIELD PLATINUM 28MM X10MM [id number] GRAFT;  Surgeon: Gabriel Pollack, MD;  Location: [place];  Service: Open Heart Surgery;  Laterality: N/A;   REVERSE SHOULDER ARTHROPLASTY Right 05/23/2020   Procedure: REVERSE SHOULDER ARTHROPLASTY;  Surgeon: Gabriel Gash, MD;  Location: [facility] ORS;  Service: Orthopedics;  Laterality: Right;   RIGHT/LEFT HEART CATH AND CORONARY ANGIOGRAPHY N/A 12/19/2020   Procedure: RIGHT/LEFT  HEART CATH AND CORONARY ANGIOGRAPHY;  Surgeon: Gabriel Man, MD;  Location: [place] CV LAB;  Service: Cardiovascular;  Laterality: N/A;   ROTATOR CUFF REPAIR Right 2009   TEE WITHOUT CARDIOVERSION N/A 09/16/2021   Procedure: TRANSESOPHAGEAL ECHOCARDIOGRAM (TEE);  Surgeon: Gabriel Pollack, MD;  Location: [place];  Service: Open Heart Surgery;  Laterality: N/A;   TOTAL HIP ARTHROPLASTY Left 12/30/2013   Procedure: LEFT TOTAL HIP ARTHROPLASTY ANTERIOR APPROACH;  Surgeon: Mcarthur Rossetti, MD;  Location: [facility] ORS;  Service: Orthopedics;  Laterality: Left;    Medications: Current Outpatient Medications  Medication Sig Dispense Refill   acetaminophen (TYLENOL) 500 MG tablet Take 1,000 mg by mouth every 6 (six) hours as needed for moderate pain.     amiodarone (PACERONE) 200 MG tablet Take 1 tablet (200 mg total) by mouth daily. 30 tablet 1   amoxicillin (AMOXIL) 500 MG tablet Take 2,000 mg by mouth See admin instructions. Take 2000 mg by mouth one hour prior to dental procedures     aspirin 81 MG tablet Take 1 tablet (81 mg total) by mouth daily.     Metoprolol Tartrate (LOPRESSOR) 50 MG tablet Take 1 tablet (50 mg total) by mouth 2 (two) times  daily. 60 tablet 2   nitroGLYCERIN (NITROSTAT) 0.4 MG SL tablet Place 1 tablet (0.4 mg total) under the tongue every 5 (five) minutes as needed. 25 tablet 6   rosuvastatin (CRESTOR) 40 MG tablet TAKE 1 TABLET BY MOUTH EVERY DAY (Patient taking differently: Take 40 mg by mouth every evening.) 90 tablet 1   traMADol (ULTRAM) 50 MG tablet Take 1 tablet (50 mg total) by mouth every 6 (six) hours as needed for moderate pain. 28 tablet 0   vitamin B-12 (CYANOCOBALAMIN) 500 MCG tablet Take 500 mcg by mouth daily.     warfarin (COUMADIN) 5 MG tablet Take 1 tablet (5 mg total) by mouth daily at 4 PM. Or as instructed by the Coumadin Clinic. 30 tablet 11   No current facility-administered medications for this visit.    No Known Allergies  Diagnoses:  F33.0  major depressive affective disorder, recurrent, mild   Plan of Care: The patient is a 51 year old Caucasian male who was referred due to experiencing depression secondary to his heart health. The patient lives at home with his wife, two sons, one dog, and three cats. The patient meets criteria for a diagnosis of F33.0 major depressive affective disorder, recurrent, mild based off of the following: feeling down, sad, social isolation, fatigue, lack of motivation, rumination of negative thoughts, and low self-esteem. He denied suicidal and homicidal ideation.   The patient stated that he wants to go home and discuss options with his wife.  This psychologist makes the recommendation that the patient participate in therapy at least once a month.    Conception Chancy, PsyD

## 2021-11-04 ENCOUNTER — Ambulatory Visit (HOSPITAL_COMMUNITY): Payer: No Typology Code available for payment source | Attending: Cardiology

## 2021-11-04 ENCOUNTER — Other Ambulatory Visit: Payer: Self-pay

## 2021-11-04 DIAGNOSIS — I350 Nonrheumatic aortic (valve) stenosis: Secondary | ICD-10-CM | POA: Diagnosis not present

## 2021-11-04 LAB — ECHOCARDIOGRAM COMPLETE
AR max vel: 4.21 cm2
AV Area VTI: 3.9 cm2
AV Area mean vel: 3.92 cm2
AV Mean grad: 7.5 mmHg
AV Peak grad: 13.5 mmHg
Ao pk vel: 1.84 m/s
Area-P 1/2: 3.5 cm2
P 1/2 time: 381 msec
S' Lateral: 3.2 cm

## 2021-11-07 ENCOUNTER — Telehealth: Payer: Self-pay | Admitting: Cardiology

## 2021-11-07 NOTE — Telephone Encounter (Signed)
Returned patient's call to discuss results of echocardiogram. ? ?The patient has been notified of the result and verbalized understanding.  All questions (if any) were answered. ?Franchot Gallo, RN 11/07/2021 11:08 AM  ? ?

## 2021-11-07 NOTE — Telephone Encounter (Signed)
Pt returning call for results... please advise  

## 2021-11-13 ENCOUNTER — Encounter: Payer: Self-pay | Admitting: Surgery

## 2021-11-13 ENCOUNTER — Telehealth (HOSPITAL_COMMUNITY): Payer: Self-pay

## 2021-11-13 NOTE — Telephone Encounter (Signed)
Called patient to see if he was interested in participating in the Cardiac Rehab Program. Patient stated yes. Patient will come in for orientation on 3/30 @ 10AM and will attend the 1PM exercise class. ?  ?Pensions consultant.  ?

## 2021-11-14 ENCOUNTER — Telehealth (HOSPITAL_COMMUNITY): Payer: Self-pay

## 2021-11-14 NOTE — Telephone Encounter (Deleted)
Pt called and stated that he wanted to schedule for cardiac rehab. I advised pt that he would have to complete his f/u with Dr. Ganji on 3/20 and to ask him to send a cardiac rehab referral to us and to ask for a copy of a 12 lead EKG pt stated that he would. ?

## 2021-11-17 ENCOUNTER — Other Ambulatory Visit: Payer: Self-pay | Admitting: Cardiology

## 2021-11-17 DIAGNOSIS — I10 Essential (primary) hypertension: Secondary | ICD-10-CM

## 2021-11-22 ENCOUNTER — Other Ambulatory Visit: Payer: Self-pay

## 2021-11-22 ENCOUNTER — Ambulatory Visit (INDEPENDENT_AMBULATORY_CARE_PROVIDER_SITE_OTHER): Payer: No Typology Code available for payment source | Admitting: *Deleted

## 2021-11-22 DIAGNOSIS — Z952 Presence of prosthetic heart valve: Secondary | ICD-10-CM | POA: Diagnosis not present

## 2021-11-22 DIAGNOSIS — Z5181 Encounter for therapeutic drug level monitoring: Secondary | ICD-10-CM

## 2021-11-22 LAB — POCT INR: INR: 2.1 (ref 2.0–3.0)

## 2021-11-22 NOTE — Patient Instructions (Signed)
Description   ?Continue taking warfarin 1 tablet daily except for 1/2 tablet on Tuesday, Thursday, and Saturday. Recheck INR in 4 weeks.  ? ?Anticoagulation Clinic [phone] Main [phone] ?  ?  ?

## 2021-12-03 ENCOUNTER — Telehealth (HOSPITAL_COMMUNITY): Payer: Self-pay

## 2021-12-05 ENCOUNTER — Encounter (HOSPITAL_COMMUNITY)
Admission: RE | Admit: 2021-12-05 | Discharge: 2021-12-05 | Disposition: A | Discharge status: Home / Self Care | Payer: No Typology Code available for payment source | Source: Ambulatory Visit | Attending: Cardiology | Admitting: Cardiology

## 2021-12-05 ENCOUNTER — Telehealth: Payer: Self-pay | Admitting: Physician Assistant

## 2021-12-05 VITALS — BP 114/78 | HR 53 | Ht 69.75 in | Wt 257.7 lb

## 2021-12-05 DIAGNOSIS — Z952 Presence of prosthetic heart valve: Secondary | ICD-10-CM | POA: Insufficient documentation

## 2021-12-05 DIAGNOSIS — Z95828 Presence of other vascular implants and grafts: Secondary | ICD-10-CM | POA: Insufficient documentation

## 2021-12-05 NOTE — Telephone Encounter (Signed)
Call received from [facility], cardiac rehab, with questions regarding continuation of amiodarone. Per OV note, he was instructed to stop amiodarone on 12/01/21, but he is still taking amiodarone. I spoke with the patient who describes improving, but persistent, heart pounding episodes. Based on his description and duration of palpitations, I do not suspect these are episodes of Afib. He may be having ventricular ectopy that is more noticeable after surgery. He will see TCTS next week. I recommended discussing with Dr. Vivi Martens team. I think he can ideally come off of amiodarone after that visit pending EKG. He is protected from stroke with coumadin. He is comfortable with this plan. ? ?Ledora Bottcher, PA-C ?12/05/2021, 11:18 AM ?[phone] ?[place] ?  ?

## 2021-12-05 NOTE — Progress Notes (Signed)
Cardiac Rehab Medication Review by a Nurse ? ?Does the patient  feel that his/her medications are working for him/her?  yes ? ?Has the patient been experiencing any side effects to the medications prescribed?  yes ? ?Does the patient measure his/her own blood pressure or blood glucose at home?  no  ? ?Does the patient have any problems obtaining medications due to transportation or finances?   no ? ?Understanding of regimen: good ?Understanding of indications: good ?Potential of compliance: good ? ? ? ?Nurse comments: [name] is taking his medications as prescribed and has a good understanding of what his medications are for. [name] does not have a blood pressure monitor to check his blood pressures at home. [name] does have a smart watch. ? ? ? ?Harrell Gave RN ?12/05/2021 11:35 AM ?  ?

## 2021-12-05 NOTE — Progress Notes (Signed)
Patient here for cardiac rehab orientation. Resting heart rate noted in the mid 40's lowest heart rate noted at 46. Blood pressure 114/78. Oxygen saturation 97% on room air. Taking medications as prescribed. Angie Duke PA paged and notified.  Adalberto Ill with Mr Speir over the phone. Mr Talavera says sometimes he feels like his is having palpitations though they have improved. Angie told Mr Rossano he can continue to take his Amiodarone and is okay to proceed with his 6 minute walk test and exercise.Will continue to monitor the patient throughout  the program.Kamyah Wilhelmsen Harlon Flor, RN,BSN ?12/05/2021 11:31 AM  ?

## 2021-12-06 ENCOUNTER — Encounter (HOSPITAL_COMMUNITY): Payer: Self-pay

## 2021-12-06 NOTE — Progress Notes (Signed)
Cardiac Individual Treatment Plan ? ?Patient Details  ?Name: Gabriel Anderson ?MRN: [id number] ?Date of Birth: 1971-05-06 ?Referring Provider:   ?Flowsheet Row CARDIAC REHAB PHASE II ORIENTATION from 12/05/2021 in [place]  ?Referring Provider Candee Furbish, MD  ? ?  ? ? ?Initial Encounter Date:  ?Flowsheet Row CARDIAC REHAB PHASE II ORIENTATION from 12/05/2021 in [place]  ?Date 12/05/21  ? ?  ? ? ?Visit Diagnosis: 09/16/21 S/P AVR (aortic valve replacement) ? ?09/16/21 Replacement of ascending aorta ? ?Patient's Home Medications on Admission: ? ?Current Outpatient Medications:  ?  acetaminophen (TYLENOL) 500 MG tablet, Take 1,000 mg by mouth every 8 (eight) hours as needed for moderate pain., Disp: , Rfl:  ?  amiodarone (PACERONE) 200 MG tablet, Take 1 tablet (200 mg total) by mouth daily., Disp: 30 tablet, Rfl: 1 ?  amoxicillin (AMOXIL) 500 MG tablet, Take 2,000 mg by mouth See admin instructions. Take 2000 mg by mouth one hour prior to dental procedures, Disp: , Rfl:  ?  aspirin 81 MG tablet, Take 1 tablet (81 mg total) by mouth daily., Disp: , Rfl:  ?  Metoprolol Tartrate (LOPRESSOR) 50 MG tablet, Take 1 tablet (50 mg total) by mouth 2 (two) times daily., Disp: 60 tablet, Rfl: 2 ?  rosuvastatin (CRESTOR) 40 MG tablet, TAKE 1 TABLET BY MOUTH EVERY DAY (Patient taking differently: Take 40 mg by mouth every evening.), Disp: 90 tablet, Rfl: 1 ?  warfarin (COUMADIN) 5 MG tablet, Take 1 tablet (5 mg total) by mouth daily at 4 PM. Or as instructed by the Coumadin Clinic. (Patient taking differently: Take 2.5-5 mg by mouth See admin instructions. Or as instructed by the Coumadin Clinic, 2.5 mg on Tues, Thurs and Sat, 5 mg all other days), Disp: 30 tablet, Rfl: 11 ?  nitroGLYCERIN (NITROSTAT) 0.4 MG SL tablet, Place 1 tablet (0.4 mg total) under the tongue every 5 (five) minutes as needed. (Patient not taking: Reported on 12/05/2021), Disp: 25 tablet, Rfl:  6 ? ?Past Medical History: ?Past Medical History:  ?Diagnosis Date  ? Acute myocardial infarction, subendocardial infarction, initial episode of care [facility]) 2013  ? Cardiac catheterization 06/02/12-widely patent previously placed LAD stents. Troponin peaked 3. Normal EF.  ? Anemia   ? Arthritis   ? Ascending aortic aneurysm 08/26/2021  ? 4.4 cm on 05/16/21 CT  ? Coronary atherosclerosis of native coronary artery 05/21/2012  ? Cardiac catheterization 05/21/12-95% mid LAD lesion at the bifurcation of a large diagonal branch. Normal ejection fraction.  ? Dyspnea   ? Heart murmur   ? aortic stenosis  ? Hyperlipidemia   ? Hypertension   ? Pneumonia   ? Raynaud's disease   ? Vasospasm ([place])   ? ? ?Tobacco Use: ?Social History  ? ?Tobacco Use  ?Smoking Status Never  ?Smokeless Tobacco Never  ? ? ?Labs: ?Review Flowsheet   ? ?  ?  Latest Ref Rng & Units 03/27/2020 12/19/2020 09/12/2021 09/16/2021  ?Labs for ITP Cardiac and Pulmonary Rehab  ?Cholestrol <200 mg/dL 109       ?LDL (calc) mg/dL (calc) 51       ?Direct LDL 0 - 99 mg/dL      ?HDL-C > OR = 40 mg/dL 42       ?Trlycerides <150 mg/dL 76       ?Hemoglobin A1c 4.8 - 5.6 % 5.5    5.3     ?PH, Arterial 7.350 - 7.450  7.342  7.421   7.338    ? 7.373    ? 7.359    ? 7.357    ? 7.399    ? 7.438    ? 7.382    ? 7.379    ?PCO2 arterial 32.0 - 48.0 mmHg  47.1   40.0   46.9    ? 40.4    ? 45.9    ? 46.3    ? 43.4    ? 36.3    ? 54.4    ? 48.2    ?Bicarbonate 20.0 - 28.0 mmol/L  24.8    ? 25.3    ? 25.6   25.5   25.3    ? 23.7    ? 26.4    ? 26.0    ? 26.9    ? 24.6    ? 27.8    ? 32.4    ? 28.5    ?TCO2 22 - 32 mmol/L  26    ? 27    ? 27    27    ? 25    ? 28    ? 27    ? 27    ? 27    ? 28    ? 26    ? 25    ? 29    ? 34    ? 28    ? 30    ? 28    ?Acid-base deficit 0.0 - 2.0 mmol/L  2.0    ? 1.0    ? 1.0    1.0    ? 2.0    ?O2 Saturation %  73.0    ? 78.0    ? 99.0   97.7   99.0    ? 99.0    ? 100.0    ? 100.0    ? 100.0    ? 100.0    ? 95.0    ? 100.0    ? 100.0    ? ?  10/03/2021   ?Labs for ITP Cardiac and Pulmonary Rehab  ?Cholestrol   ?LDL (calc)   ?Direct LDL 71    ?HDL-C   ?Trlycerides   ?Hemoglobin A1c   ?PH, Arterial   ?PCO2 arterial   ?Bicarbonate   ?TCO2   ?Acid-base deficit   ?O2 Saturation   ?  ? ? Multiple values from one day are sorted in reverse-chronological order  ?  ?  ? ? ?Capillary Blood Glucose: ?Lab Results  ?Component Value Date  ? GLUCAP 130 (H) 09/18/2021  ? GLUCAP 135 (H) 09/18/2021  ? GLUCAP 113 (H) 09/18/2021  ? GLUCAP 131 (H) 09/17/2021  ? GLUCAP 61 (L) 09/17/2021  ? ? ? ?Exercise Target Goals: ?Exercise Program Goal: ?Individual exercise prescription set using results from initial 6 min walk test and THRR while considering  patient?s activity barriers and safety.  ? ?Exercise Prescription Goal: ?Starting with aerobic activity 30 plus minutes a day, 3 days per week for initial exercise prescription. Provide home exercise prescription and guidelines that participant acknowledges understanding prior to discharge. ? ?Activity Barriers & Risk Stratification: ? Activity Barriers & Cardiac Risk Stratification - 12/05/21 1241   ? ?  ? Activity Barriers & Cardiac Risk Stratification  ? Activity Barriers Arthritis;Left Hip Replacement;Joint Problems   Right shoulder replacement; chroinc right hip pain  ? Cardiac Risk Stratification High   ? ?  ?  ? ?  ? ? ?6 Minute Walk: ? 6 Minute Walk   ? ?  [place] Name 12/05/21 1237  ?  ?  ?  ? 6 Minute Walk  ? Phase Initial    ? Distance 1512 feet    ? Walk Time 6 minutes    ? # of Rest Breaks 0    ? MPH 2.86    ? METS 3.61    ? RPE 8    ? Perceived Dyspnea  0    ? VO2 Peak 12.64    ? Symptoms Yes (comment)    ? Comments Rt hip pain 6/10, stiff thights, no pain    ? Resting HR 46 bpm    ? Resting BP 114/78    ? Resting Oxygen Saturation  97 %    ? Exercise Oxygen Saturation  during 6 min walk 98 %    ? Max Ex. HR 89 bpm    ? Max Ex. BP 144/68    ? 2 Minute Post BP 124/68    ? ?  ?  ? ?  ? ? ?Oxygen Initial Assessment: ? ? ?Oxygen  Re-Evaluation: ? ? ?Oxygen Discharge (Final Oxygen Re-Evaluation): ? ? ?Initial Exercise Prescription: ? Initial Exercise Prescription - 12/05/21 1200   ? ?  ? Date of Initial Exercise RX and Referring Provider  ? Date 12/05/21   ? Referring Provider Candee Furbish, MD   ? Expected Discharge Date 01/31/22   ?  ? Recumbant Bike  ? Level 2.5   ? Minutes 15   ? METs 3.6   ?  ? Arm Ergometer  ? Level 2.5   ? Minutes 15   ? METs 3   ?  ? Prescription Details  ? Frequency (times per week) 3   ? Duration Progress to 30 minutes of continuous aerobic without signs/symptoms of physical distress   ?  ? Intensity  ? THRR 40-80% of Max Heartrate 67-134   ? Ratings of Perceived Exertion 11-13   ? Perceived Dyspnea 0-4   ?  ? Progression  ? Progression Continue progressive overload as per policy without signs/symptoms or physical distress.   ?  ? Resistance Training  ? Training Prescription Yes   ? Weight 5 lbs   ? Reps 10-15   ? ?  ?  ? ?  ? ? ?Perform Capillary Blood Glucose checks as needed. ? ?Exercise Prescription Changes: ? ? ?Exercise Comments: ? ? ?Exercise Goals and Review: ? ? Exercise Goals   ? ? [place] Name 12/05/21 1245  ?  ?  ?  ?  ?  ? Exercise Goals  ? Increase Physical Activity Yes      ? Intervention Provide advice, education, support and counseling about physical activity/exercise needs.;Develop an individualized exercise prescription for aerobic and resistive training based on initial evaluation findings, risk stratification, comorbidities and participant's personal goals.      ? Expected Outcomes Short Term: Attend rehab on a regular basis to increase amount of physical activity.;Long Term: Add in home exercise to make exercise part of routine and to increase amount of physical activity.;Long Term: Exercising regularly at least 3-5 days a week.      ? Increase Strength and Stamina Yes      ? Intervention Provide advice, education, support and counseling about physical activity/exercise needs.;Develop an  individualized exercise prescription for aerobic and resistive training based on initial evaluation findings, risk stratification, comorbidities and participant's personal goals.      ? Able to understand and use rate of perceived

## 2021-12-09 ENCOUNTER — Encounter (HOSPITAL_COMMUNITY)
Admission: RE | Admit: 2021-12-09 | Discharge: 2021-12-09 | Disposition: A | Discharge status: Home / Self Care | Payer: No Typology Code available for payment source | Source: Ambulatory Visit | Attending: Cardiology | Admitting: Cardiology

## 2021-12-09 DIAGNOSIS — Z48812 Encounter for surgical aftercare following surgery on the circulatory system: Secondary | ICD-10-CM | POA: Diagnosis not present

## 2021-12-09 DIAGNOSIS — Z8679 Personal history of other diseases of the circulatory system: Secondary | ICD-10-CM | POA: Diagnosis not present

## 2021-12-09 DIAGNOSIS — Z952 Presence of prosthetic heart valve: Secondary | ICD-10-CM | POA: Diagnosis present

## 2021-12-09 DIAGNOSIS — Z95828 Presence of other vascular implants and grafts: Secondary | ICD-10-CM

## 2021-12-09 NOTE — Progress Notes (Signed)
Daily Session Note ? ?Patient Details  ?Name: Gabriel Anderson ?MRN: [id number] ?Date of Birth: Oct 08, 1970 ?Referring Provider:   ?Flowsheet Row CARDIAC REHAB PHASE II ORIENTATION from 12/05/2021 in [facility]  ?Referring Provider Candee Furbish, MD  ? ?  ? ? ?Encounter Date: 12/09/2021 ? ?Check In: ? Session Check In - 12/09/21 1300   ? ?  ? Check-In  ? Supervising physician immediately available to respond to emergencies [facility] immediately available   ? Physician(s) Dr Maryland Pink   ? Location [facility]   ? Staff Present Barnet Pall, RN, Milus Glazier, MS, ACSM-CEP, CCRP, Exercise Physiologist;Jetta Gilford Rile BS, ACSM EP-C, Exercise Physiologist;Olinty [place], MS, ACSM CEP, Exercise Physiologist;Carlette Wilber Ol[place], Therapist, sports, BSN   ? Virtual Visit No   ? Medication changes reported     No   ? Fall or balance concerns reported    No   ? Tobacco Cessation No Change   ? Current number of cigarettes/nicotine per day     0   ? Warm-up and Cool-down Not performed (comment)   Cardiac Rehab Orientation  ? Resistance Training Performed Yes   ? VAD Patient? No   ? PAD/SET Patient? No   ?  ? Pain Assessment  ? Currently in Pain? No/denies   ? Pain Score 0-No pain   ? Multiple Pain Sites No   ? ?  ?  ? ?  ? ? ?Capillary Blood Glucose: ?No results found for this or any previous visit (from the past 24 hour(s)). ? ? Exercise Prescription Changes - 12/09/21 1400   ? ?  ? Response to Exercise  ? Blood Pressure (Admit) 122/68   ? Blood Pressure (Exercise) 160/80   ? Blood Pressure (Exit) 120/80   ? Heart Rate (Admit) 42 bpm   ? Heart Rate (Exercise) 85 bpm   ? Heart Rate (Exit) 56 bpm   ? Rating of Perceived Exertion (Exercise) 11   ? Symptoms Chronic right hips pain 7/10 on bike   ? Comments Pt's first day in the CRP2 program   ? Duration Continue with 30 min of aerobic exercise without signs/symptoms of physical distress.   ? Intensity THRR unchanged   ?  ? Progression  ?  Progression Continue to progress workloads to maintain intensity without signs/symptoms of physical distress.   ? Average METs 2.35   ?  ? Resistance Training  ? Training Prescription Yes   ? Weight 5 lbs   ? Reps 10-15   ? Time 10 Minutes   ?  ? Interval Training  ? Interval Training No   ?  ? Recumbant Bike  ? Level 2.5   ? RPM 68   ? Minutes 15   ? METs 2.3   ?  ? Arm Ergometer  ? Level 2.5   ? Minutes 15   ? METs 2.4   ? ?  ?  ? ?  ? ? ?Social History  ? ?Tobacco Use  ?Smoking Status Never  ?Smokeless Tobacco Never  ? ? ?Goals Met:  ?Exercise tolerated well ?No report of concerns or symptoms today ?Strength training completed today ? ?Goals Unmet:  ?Not Applicable ? ?Comments: Gabriel Anderson started cardiac rehab today.  Pt tolerated light exercise without difficulty. VSS, telemetry-Sinus Brady, Sinus Rhythm, asymptomatic.  Medication list reconciled. Pt denies barriers to medicaiton compliance.  PSYCHOSOCIAL ASSESSMENT:  PHQ-0. Pt exhibits positive coping skills, hopeful outlook with supportive family. QUALITY OF LIFE SCORE REVIEW ? Pt completed  Quality of Life survey as a participant in Cardiac Rehab.  Scores 21.0 or below are considered low.  Pt score  low in the health and functioning aspect of his questionnaire Overall 22.22, Health and Function 20.50, socioeconomic 22.93, physiological and spiritual 19.63, family 29.50. Patient quality of life slightly altered by physical constraints which limits ability to perform as prior to recent cardiac illness.Gabriel Anderson reports having low energy but hopes participating in cardiac rehab will improve his endurance.  Offered emotional support and reassurance.  Gabriel Anderson receives counseling from his therapist twice a week for anger management and denies being depressed currently. Will continue to monitor and intervene as necessary. Pt enjoys fishing sporting events and live music.   Pt oriented to exercise equipment and routine.    Understanding verbalized. Barnet Pall, RN,BSN ?12/09/2021  4:34 PM  ? ? ?Dr. Fransico Him is Medical Director for Cardiac Rehab at [facility] ?

## 2021-12-11 ENCOUNTER — Ambulatory Visit (INDEPENDENT_AMBULATORY_CARE_PROVIDER_SITE_OTHER): Payer: Self-pay | Admitting: Surgery

## 2021-12-11 ENCOUNTER — Encounter: Payer: Self-pay | Admitting: Surgery

## 2021-12-11 VITALS — BP 160/79 | HR 55 | Resp 20 | Ht 70.0 in | Wt 256.0 lb

## 2021-12-11 DIAGNOSIS — Z09 Encounter for follow-up examination after completed treatment for conditions other than malignant neoplasm: Secondary | ICD-10-CM

## 2021-12-11 NOTE — Progress Notes (Signed)
? ? ?HPI: ? ?Patient returns for routine postoperative follow-up having undergone AVR using a 23 mm On-X mechanical valve and supra-coronary replacement of an ascending aortic aneurysm on 09/16/2021.  He developed postoperative atrial fibrillation and was converted with amiodarone.  His last INR on 11/22/2021 was 2.1.  His last 3 values have been very stable.  He continues to walk and is now participating in cardiac rehab.  He said that he has not been watching his diet as closely as he should and has gained about 16 pounds over the past couple weeks.  He has had no peripheral edema ? ?Current Outpatient Medications  ?Medication Sig Dispense Refill  ? acetaminophen (TYLENOL) 500 MG tablet Take 1,000 mg by mouth every 8 (eight) hours as needed for moderate pain.    ? amiodarone (PACERONE) 200 MG tablet Take 1 tablet (200 mg total) by mouth daily. 30 tablet 1  ? amoxicillin (AMOXIL) 500 MG tablet Take 2,000 mg by mouth See admin instructions. Take 2000 mg by mouth one hour prior to dental procedures    ? aspirin 81 MG tablet Take 1 tablet (81 mg total) by mouth daily.    ? Metoprolol Tartrate (LOPRESSOR) 50 MG tablet Take 1 tablet (50 mg total) by mouth 2 (two) times daily. 60 tablet 2  ? nitroGLYCERIN (NITROSTAT) 0.4 MG SL tablet Place 1 tablet (0.4 mg total) under the tongue every 5 (five) minutes as needed. (Patient not taking: Reported on 12/05/2021) 25 tablet 6  ? rosuvastatin (CRESTOR) 40 MG tablet TAKE 1 TABLET BY MOUTH EVERY DAY (Patient taking differently: Take 40 mg by mouth every evening.) 90 tablet 1  ? warfarin (COUMADIN) 5 MG tablet Take 1 tablet (5 mg total) by mouth daily at 4 PM. Or as instructed by the Coumadin Clinic. (Patient taking differently: Take 2.5-5 mg by mouth See admin instructions. Or as instructed by the Coumadin Clinic, 2.5 mg on Tues, Thurs and Sat, 5 mg all other days) 30 tablet 11  ? ?No current facility-administered medications for this visit.  ? ? ? ?Physical Exam: ?BP (!) 160/79    Pulse (!) 55   Resp 20   Ht 5\' 10"  (1.778 m)   Wt 256 lb (116.1 kg)   SpO2 97% Comment: RA  BMI 36.73 kg/m?  ?Cardiac exam shows a regular rate and rhythm with crisp mechanical valve click.  There is no murmur. ?Lungs are clear. ?The chest incision is well-healed and the sternum is stable. ?There is no peripheral edema. ? ?Diagnostic Tests: ? ?None today ? ?Impression: ? ?He is now about 3 months out from his surgery and has made a good recovery.  I encouraged him to continue participating in cardiac rehab.  I encouraged him to continue watching his diet closely and to remain physically active so that he can lose some weight.  I told him he can discontinue the amiodarone at this time.  His INR has been 2.1 on his current Coumadin dose and I would expect that this may drop a little after the amiodarone is stopped.  He is 3 months out from surgery and with this valve his INR can be kept at 1.5-2 as long as he is on aspirin 81 mg daily in addition to Coumadin.  I told him that he can return to full activity at this time without restriction. ? ?Plan: ? ?He will continue to follow-up with cardiology and will return to see me if he has any problems with his incisions. ? ? ?  Gaye Pollack, MD ?[facility] ?[phone] ? ? ? ? ? ? ?

## 2021-12-13 ENCOUNTER — Encounter (HOSPITAL_COMMUNITY)
Admission: RE | Admit: 2021-12-13 | Discharge: 2021-12-13 | Disposition: A | Discharge status: Home / Self Care | Payer: No Typology Code available for payment source | Source: Ambulatory Visit | Attending: Cardiology | Admitting: Cardiology

## 2021-12-13 DIAGNOSIS — Z95828 Presence of other vascular implants and grafts: Secondary | ICD-10-CM

## 2021-12-13 DIAGNOSIS — Z48812 Encounter for surgical aftercare following surgery on the circulatory system: Secondary | ICD-10-CM | POA: Diagnosis not present

## 2021-12-13 DIAGNOSIS — Z952 Presence of prosthetic heart valve: Secondary | ICD-10-CM

## 2021-12-16 ENCOUNTER — Other Ambulatory Visit: Payer: Self-pay | Admitting: Physician Assistant

## 2021-12-16 ENCOUNTER — Encounter: Payer: Self-pay | Admitting: Cardiology

## 2021-12-16 ENCOUNTER — Encounter (HOSPITAL_COMMUNITY)
Admission: RE | Admit: 2021-12-16 | Discharge: 2021-12-16 | Disposition: A | Discharge status: Home / Self Care | Payer: No Typology Code available for payment source | Source: Ambulatory Visit | Attending: Cardiology | Admitting: Cardiology

## 2021-12-16 DIAGNOSIS — Z952 Presence of prosthetic heart valve: Secondary | ICD-10-CM

## 2021-12-16 DIAGNOSIS — Z48812 Encounter for surgical aftercare following surgery on the circulatory system: Secondary | ICD-10-CM | POA: Diagnosis not present

## 2021-12-16 DIAGNOSIS — Z95828 Presence of other vascular implants and grafts: Secondary | ICD-10-CM

## 2021-12-16 DIAGNOSIS — I4891 Unspecified atrial fibrillation: Secondary | ICD-10-CM

## 2021-12-16 MED ORDER — NITROGLYCERIN 0.4 MG SL SUBL
0.4000 mg | SUBLINGUAL_TABLET | SUBLINGUAL | 6 refills | Status: AC | PRN
Start: 2021-12-16 — End: ?

## 2021-12-16 MED ORDER — METOPROLOL TARTRATE 50 MG PO TABS: 50.0000 mg | ORAL_TABLET | Freq: Two times a day (BID) | ORAL | 3 refills | Status: DC

## 2021-12-16 NOTE — Progress Notes (Signed)
Alroy Dust 51 y.o. male ?Nutrition Note ?Gabriel Anderson is motivated to make lifestyle changes to aid with cardiac. Patient has medical history of HTN, CAD, ascending aortic aneurysm, dyslipidemia, s/p aortic valve replacement. He lives at home with is wife, [name]. She is supportive of making lifestyle changes. He has started making some dietary changes including decreased alcohol intake, decreased red meat intake, and decreased sodium intake. He is motivated to lose weight and get to 225#.   ? ?Nutrition Diagnosis ?Obesity related to excessive energy intake as evidenced by a BMI of 36.7 ?Inappropriate intake of saturated fats related excessive consumption of fatty meats and processed foods as evidenced by pt's diet recall and  ? ?Nutrition Intervention ?Pt?s individual nutrition plan reviewed with pt. ?Benefits of adopting Heart Healthy diet discussed.  ?Continue client-centered nutrition education by RD, as part of interdisciplinary care. ? ?Monitor/Evaluation: ?Patient reports motivation to make lifestyle changes for adherence to heart healthy diet recommendation and weight management. We discussed increased fiber intake by prioritizing vegetable intake using the plate method, continuation of decreased red meat intake and introducing fish up to 2-3x/week. Reviewed many lean protein sources. Encouraged patient to continue to monitor alcohol intake and sodium intake. Handouts/notes given. Patient amicable to RD suggestions and verbalizes understanding. Will follow-up as needed.  ? ?15 minutes spent in review of topics related to a heart healthy diet including sodium intake, blood sugar control, weight management, and fiber intake. ? ?Goal(s) ?Pt to identify and limit food sources of saturated fat, trans fat, refined carbohydrates and sodium-- Patient to limit red meat to 1x per week.  ?Patient to use the plate method as a guide for meal planning, continue to monitor portions and increase non-starchy vegetable intake  (1/2 the plate).  ?Pt to describe the benefit of including lean protein/plant proteins, fruits, vegetables, whole grains, nuts/seeds, and low-fat dairy products in a heart healthy meal plan- Patient to include non-starchy vegetables at lunch and dinner daily.  ?Patient to reduce alcohol intake ?Patient to limit sodium intake to 2300mg  per day.  ? ?Plan:  ?Pt to attend nutrition classes  ?Will provide client-centered nutrition education as part of interdisciplinary care ?Monitor and evaluate progress toward nutrition goal with team. ? ? ?Lelon Mast [place], MS, RDN, LDN ? ?

## 2021-12-16 NOTE — Telephone Encounter (Signed)
Requested refills sent to pharmacy on file. Will route to PharmD pool for advice on methocarbamol. ?

## 2021-12-17 NOTE — Progress Notes (Signed)
Gabriel Anderson 51 y.o. male ?Nutrition Note ?[name] remains motivated to make lifestyle changes to aid with cardiac rehab. His wife, Danae Chen, was present for nutrition counseling today. They are eager to implement dietary changes for heart health as a whole family. They also have two teenage sons. Danae Chen does the majority of the grocery shopping and cooking. He continues to prioritize heart health, weight loss, alcohol reduction, and sodium intake.  ? ?Nutrition Diagnosis ?Obesity related to excessive energy intake as evidenced by a 36.7 ?Excessive sodium intake related to over consumption of processed food as evidenced by frequent consumption of convenience food/ canned vegetables and eating out frequently. ?Inappropriate intake of saturated fats related excessive consumption of fatty meats and processed foods as evidenced by pt's diet recall  ? ?Nutrition Intervention ?Pt?s individual nutrition plan reviewed with pt. ?Benefits of adopting Heart Healthy diet discussed. ?Continue client-centered nutrition education by RD, as part of interdisciplinary care. ? ?Monitor/Evaluation: ?Patient reports motivation to make lifestyle changes for adherence to heart healthy diet recommendation, blood sugar control, and weight management. We discussed/reviewed goals from previous appointment (the plate method, fiber intake, sodium intake, alcohol intake) and strategies for sustainability and implementation as a whole family. Reviewed strategies for weight loss including the plate method, protein supplements, etc. Encouraged follow-up with coumadin clinic regarding dietary changes. Handouts/notes given. Patient amicable to RD suggestions and verbalizes understanding. Will follow-up as needed.  ? ?15 minutes spent in review of topics related to a heart healthy diet including sodium intake, blood sugar control, weight management, and fiber intake. ? ?Goal(s) ?Prioritize fiber intake from nuts/seeds, whole grains, vegetables,  fruit. Limit any refined carbohydrates, simple sugars, sugary beverages, etc. Opt for whole grains, starchy vegetables and fruits as primary carbohydrate sources. Increase fruit and vegetables to at least 5 servings per day.   ?Limit red meat to 1x/week. Prioritize lean protein options such as chicken, fish, [place], soy etc. Aim for 3-4oz at meals. Limit/eliminate anything deep fried. Opt for baked, broiled, grilled, sauteed, etc.  ?Great work reducing alcohol intake. Limit alcohol intake to <1-2 drinks/day  ?Continue to reduce sodium intake 2300mg /day. Look for products with ?low sodium?, ?reduced sodium?, or ?no salt added?. Eliminate any added salt.  ? ?Plan:  ?Pt to attend nutrition classes  ?Will provide client-centered nutrition education as part of interdisciplinary care ?Monitor and evaluate progress toward nutrition goal with team. ? ? ?Aldona Bar [place], MS, RDN, LDN ? ?

## 2021-12-17 NOTE — Progress Notes (Signed)
Cardiac Individual Treatment Plan ? ?Patient Details  ?Name: Gabriel Anderson ?MRN: [id number] ?Date of Birth: 20-Sep-1970 ?Referring Provider:   ?Flowsheet Row CARDIAC REHAB PHASE II ORIENTATION from 12/05/2021 in [facility] CARDIAC REHAB  ?Referring Provider Donato Schultz, MD  ? ?  ? ? ?Initial Encounter Date:  ?Flowsheet Row CARDIAC REHAB PHASE II ORIENTATION from 12/05/2021 in [facility] CARDIAC REHAB  ?Date 12/05/21  ? ?  ? ? ?Visit Diagnosis: 09/16/21 S/P AVR (aortic valve replacement) ? ?09/16/21 Replacement of ascending aorta ? ?Patient's Home Medications on Admission: ? ?Current Outpatient Medications:  ?  acetaminophen (TYLENOL) 500 MG tablet, Take 1,000 mg by mouth every 8 (eight) hours as needed for moderate pain., Disp: , Rfl:  ?  amiodarone (PACERONE) 200 MG tablet, Take 1 tablet (200 mg total) by mouth daily., Disp: 30 tablet, Rfl: 1 ?  amoxicillin (AMOXIL) 500 MG tablet, Take 2,000 mg by mouth See admin instructions. Take 2000 mg by mouth one hour prior to dental procedures, Disp: , Rfl:  ?  aspirin 81 MG tablet, Take 1 tablet (81 mg total) by mouth daily., Disp: , Rfl:  ?  metoprolol tartrate (LOPRESSOR) 50 MG tablet, Take 1 tablet (50 mg total) by mouth 2 (two) times daily., Disp: 180 tablet, Rfl: 3 ?  nitroGLYCERIN (NITROSTAT) 0.4 MG SL tablet, Place 1 tablet (0.4 mg total) under the tongue every 5 (five) minutes as needed. Max of 3 doses, then 911, Disp: 25 tablet, Rfl: 6 ?  rosuvastatin (CRESTOR) 40 MG tablet, TAKE 1 TABLET BY MOUTH EVERY DAY (Patient taking differently: Take 40 mg by mouth every evening.), Disp: 90 tablet, Rfl: 1 ?  warfarin (COUMADIN) 5 MG tablet, Take 1 tablet (5 mg total) by mouth daily at 4 PM. Or as instructed by the Coumadin Clinic. (Patient taking differently: Take 2.5-5 mg by mouth See admin instructions. Or as instructed by the Coumadin Clinic, 2.5 mg on Tues, Thurs and Sat, 5 mg all other days), Disp: 30 tablet, Rfl: 11 ? ?Past Medical  History: ?Past Medical History:  ?Diagnosis Date  ? Acute myocardial infarction, subendocardial infarction, initial episode of care [facility]) 2013  ? Cardiac catheterization 06/02/12-widely patent previously placed LAD stents. Troponin peaked 3. Normal EF.  ? Anemia   ? Arthritis   ? Ascending aortic aneurysm (HCC) 08/26/2021  ? 4.4 cm on 05/16/21 CT  ? Coronary atherosclerosis of native coronary artery 05/21/2012  ? Cardiac catheterization 05/21/12-95% mid LAD lesion at the bifurcation of a large diagonal branch. Normal ejection fraction.  ? Dyspnea   ? Heart murmur   ? aortic stenosis  ? Hyperlipidemia   ? Hypertension   ? Pneumonia   ? Raynaud's disease   ? Vasospasm (HCC)   ? ? ?Tobacco Use: ?Social History  ? ?Tobacco Use  ?Smoking Status Never  ?Smokeless Tobacco Never  ? ? ?Labs: ?Review Flowsheet   ? ?  ?  Latest Ref Rng & Units 03/27/2020 12/19/2020 09/12/2021 09/16/2021  ?Labs for ITP Cardiac and Pulmonary Rehab  ?Cholestrol <200 mg/dL 749       ?LDL (calc) mg/dL (calc) 51       ?Direct LDL 0 - 99 mg/dL      ?HDL-C > OR = 40 mg/dL 42       ?Trlycerides <150 mg/dL 76       ?Hemoglobin A1c 4.8 - 5.6 % 5.5    5.3     ?PH, Arterial 7.350 - 7.450  7.342  7.421   7.338    ? 7.373    ? 7.359    ? 7.357    ? 7.399    ? 7.438    ? 7.382    ? 7.379    ?PCO2 arterial 32.0 - 48.0 mmHg  47.1   40.0   46.9    ? 40.4    ? 45.9    ? 46.3    ? 43.4    ? 36.3    ? 54.4    ? 48.2    ?Bicarbonate 20.0 - 28.0 mmol/L  24.8    ? 25.3    ? 25.6   25.5   25.3    ? 23.7    ? 26.4    ? 26.0    ? 26.9    ? 24.6    ? 27.8    ? 32.4    ? 28.5    ?TCO2 22 - 32 mmol/L  26    ? 27    ? 27    27    ? 25    ? 28    ? 27    ? 27    ? 27    ? 28    ? 26    ? 25    ? 29    ? 34    ? 28    ? 30    ? 28    ?Acid-base deficit 0.0 - 2.0 mmol/L  2.0    ? 1.0    ? 1.0    1.0    ? 2.0    ?O2 Saturation %  73.0    ? 78.0    ? 99.0   97.7   99.0    ? 99.0    ? 100.0    ? 100.0    ? 100.0    ? 100.0    ? 95.0    ? 100.0    ? 100.0    ? ?  10/03/2021  ?Labs for ITP  Cardiac and Pulmonary Rehab  ?Cholestrol   ?LDL (calc)   ?Direct LDL 71    ?HDL-C   ?Trlycerides   ?Hemoglobin A1c   ?PH, Arterial   ?PCO2 arterial   ?Bicarbonate   ?TCO2   ?Acid-base deficit   ?O2 Saturation   ?  ? ? Multiple values from one day are sorted in reverse-chronological order  ?  ?  ? ? ?Capillary Blood Glucose: ?Lab Results  ?Component Value Date  ? GLUCAP 130 (H) 09/18/2021  ? GLUCAP 135 (H) 09/18/2021  ? GLUCAP 113 (H) 09/18/2021  ? GLUCAP 131 (H) 09/17/2021  ? GLUCAP 61 (L) 09/17/2021  ? ? ? ?Exercise Target Goals: ?Exercise Program Goal: ?Individual exercise prescription set using results from initial 6 min walk test and THRR while considering  patient?s activity barriers and safety.  ? ?Exercise Prescription Goal: ?Starting with aerobic activity 30 plus minutes a day, 3 days per week for initial exercise prescription. Provide home exercise prescription and guidelines that participant acknowledges understanding prior to discharge. ? ?Activity Barriers & Risk Stratification: ? Activity Barriers & Cardiac Risk Stratification - 12/05/21 1241   ? ?  ? Activity Barriers & Cardiac Risk Stratification  ? Activity Barriers Arthritis;Left Hip Replacement;Joint Problems   Right shoulder replacement; chroinc right hip pain  ? Cardiac Risk Stratification High   ? ?  ?  ? ?  ? ? ?6 Minute Walk: ? 6 Minute Walk   ? ?  Row Name 12/05/21 1237  ?  ?  ?  ? 6 Minute Walk  ? Phase Initial    ? Distance 1512 feet    ? Walk Time 6 minutes    ? # of Rest Breaks 0    ? MPH 2.86    ? METS 3.61    ? RPE 8    ? Perceived Dyspnea  0    ? VO2 Peak 12.64    ? Symptoms Yes (comment)    ? Comments Rt hip pain 6/10, stiff thights, no pain    ? Resting HR 46 bpm    ? Resting BP 114/78    ? Resting Oxygen Saturation  97 %    ? Exercise Oxygen Saturation  during 6 min walk 98 %    ? Max Ex. HR 89 bpm    ? Max Ex. BP 144/68    ? 2 Minute Post BP 124/68    ? ?  ?  ? ?  ? ? ?Oxygen Initial Assessment: ? ? ?Oxygen  Re-Evaluation: ? ? ?Oxygen Discharge (Final Oxygen Re-Evaluation): ? ? ?Initial Exercise Prescription: ? Initial Exercise Prescription - 12/05/21 1200   ? ?  ? Date of Initial Exercise RX and Referring Provider  ? Date 12/05/21   ? Referring Provider Donato SchultzMark Skains, MD   ? Expected Discharge Date 01/31/22   ?  ? Recumbant Bike  ? Level 2.5   ? Minutes 15   ? METs 3.6   ?  ? Arm Ergometer  ? Level 2.5   ? Minutes 15   ? METs 3   ?  ? Prescription Details  ? Frequency (times per week) 3   ? Duration Progress to 30 minutes of continuous aerobic without signs/symptoms of physical distress   ?  ? Intensity  ? THRR 40-80% of Max Heartrate 67-134   ? Ratings of Perceived Exertion 11-13   ? Perceived Dyspnea 0-4   ?  ? Progression  ? Progression Continue progressive overload as per policy without signs/symptoms or physical distress.   ?  ? Resistance Training  ? Training Prescription Yes   ? Weight 5 lbs   ? Reps 10-15   ? ?  ?  ? ?  ? ? ?Perform Capillary Blood Glucose checks as needed. ? ?Exercise Prescription Changes: ? ? Exercise Prescription Changes   ? ? Row Name 12/09/21 1400  ?  ?  ?  ?  ?  ? Response to Exercise  ? Blood Pressure (Admit) 122/68      ? Blood Pressure (Exercise) 160/80      ? Blood Pressure (Exit) 120/80      ? Heart Rate (Admit) 42 bpm      ? Heart Rate (Exercise) 85 bpm      ? Heart Rate (Exit) 56 bpm      ? Rating of Perceived Exertion (Exercise) 11      ? Symptoms Chronic right hips pain 7/10 on bike      ? Comments Pt's first day in the CRP2 program      ? Duration Continue with 30 min of aerobic exercise without signs/symptoms of physical distress.      ? Intensity THRR unchanged      ?  ? Progression  ? Progression Continue to progress workloads to maintain intensity without signs/symptoms of physical distress.      ? Average METs 2.35      ?  ? Resistance Training  ?  Training Prescription Yes      ? Weight 5 lbs      ? Reps 10-15      ? Time 10 Minutes      ?  ? Interval Training  ? Interval  Training No      ?  ? Recumbant Bike  ? Level 2.5      ? RPM 68      ? Minutes 15      ? METs 2.3      ?  ? Arm Ergometer  ? Level 2.5      ? Minutes 15      ? METs 2.4      ? ?  ?  ? ?  ? ? ?Exercise Comments: ? ? Exercise Comments

## 2021-12-18 ENCOUNTER — Encounter (HOSPITAL_COMMUNITY)
Admission: RE | Admit: 2021-12-18 | Discharge: 2021-12-18 | Disposition: A | Discharge status: Home / Self Care | Payer: No Typology Code available for payment source | Source: Ambulatory Visit | Attending: Cardiology | Admitting: Cardiology

## 2021-12-18 DIAGNOSIS — Z952 Presence of prosthetic heart valve: Secondary | ICD-10-CM

## 2021-12-18 DIAGNOSIS — Z95828 Presence of other vascular implants and grafts: Secondary | ICD-10-CM

## 2021-12-18 DIAGNOSIS — Z48812 Encounter for surgical aftercare following surgery on the circulatory system: Secondary | ICD-10-CM | POA: Diagnosis not present

## 2021-12-20 ENCOUNTER — Encounter (HOSPITAL_COMMUNITY): Payer: No Typology Code available for payment source

## 2021-12-20 ENCOUNTER — Ambulatory Visit (INDEPENDENT_AMBULATORY_CARE_PROVIDER_SITE_OTHER): Payer: No Typology Code available for payment source | Admitting: *Deleted

## 2021-12-20 DIAGNOSIS — Z952 Presence of prosthetic heart valve: Secondary | ICD-10-CM | POA: Diagnosis not present

## 2021-12-20 DIAGNOSIS — Z5181 Encounter for therapeutic drug level monitoring: Secondary | ICD-10-CM | POA: Diagnosis not present

## 2021-12-20 LAB — POCT INR: INR: 1.4 — AB (ref 2.0–3.0)

## 2021-12-20 NOTE — Patient Instructions (Addendum)
Description   ?Take 1.5 tablets of warfarin today, then continue taking warfarin 1 tablet daily except for 1/2 tablet on Tuesday, Thursday, and Saturday. Establish and be consistent with your vitamin K in take.  Recheck INR in 1.5 weeks.   ? ?Anticoagulation Clinic [phone] Main [phone] ?  ?  ?

## 2021-12-23 ENCOUNTER — Telehealth (HOSPITAL_COMMUNITY): Payer: Self-pay

## 2021-12-23 ENCOUNTER — Encounter (HOSPITAL_COMMUNITY): Payer: No Typology Code available for payment source

## 2021-12-25 ENCOUNTER — Encounter (HOSPITAL_COMMUNITY)
Admission: RE | Admit: 2021-12-25 | Discharge: 2021-12-25 | Disposition: A | Discharge status: Home / Self Care | Payer: No Typology Code available for payment source | Source: Ambulatory Visit | Attending: Cardiology | Admitting: Cardiology

## 2021-12-25 DIAGNOSIS — Z95828 Presence of other vascular implants and grafts: Secondary | ICD-10-CM

## 2021-12-25 DIAGNOSIS — Z952 Presence of prosthetic heart valve: Secondary | ICD-10-CM

## 2021-12-25 DIAGNOSIS — Z48812 Encounter for surgical aftercare following surgery on the circulatory system: Secondary | ICD-10-CM | POA: Diagnosis not present

## 2021-12-27 ENCOUNTER — Encounter (HOSPITAL_COMMUNITY)
Admission: RE | Admit: 2021-12-27 | Discharge: 2021-12-27 | Disposition: A | Discharge status: Home / Self Care | Payer: No Typology Code available for payment source | Source: Ambulatory Visit | Attending: Cardiology | Admitting: Cardiology

## 2021-12-27 DIAGNOSIS — Z48812 Encounter for surgical aftercare following surgery on the circulatory system: Secondary | ICD-10-CM | POA: Diagnosis not present

## 2021-12-27 DIAGNOSIS — Z952 Presence of prosthetic heart valve: Secondary | ICD-10-CM

## 2021-12-27 DIAGNOSIS — Z95828 Presence of other vascular implants and grafts: Secondary | ICD-10-CM

## 2021-12-30 ENCOUNTER — Encounter (HOSPITAL_COMMUNITY): Payer: No Typology Code available for payment source

## 2021-12-31 NOTE — Progress Notes (Signed)
Reviewed home exercise Rx with patient today.  Encouraged warm-up, cool-down, and stretching. Reviewed THRR of  67 - 134 and keeping RPE between 11-13. Encouraged to hydrate with activity.  Reviewed weather parameters for temperature and humidity for safe exercise outdoors. Reviewed S/S to terminate exercise and when to call 911 vs MD. Reviewed the use of NTG and pt was encouraged to carry at all times. Pt encouraged to always carry a cell phone for safety when exercising outdoors. Pt verbalized understanding of the home exercise Rx and was provided a copy.   Richardine Peppers MS, ACSM-CEP, CCRP  

## 2022-01-01 ENCOUNTER — Encounter (HOSPITAL_COMMUNITY): Payer: No Typology Code available for payment source

## 2022-01-01 ENCOUNTER — Telehealth (HOSPITAL_COMMUNITY): Payer: Self-pay

## 2022-01-03 ENCOUNTER — Encounter (HOSPITAL_COMMUNITY): Payer: No Typology Code available for payment source

## 2022-01-06 ENCOUNTER — Ambulatory Visit (INDEPENDENT_AMBULATORY_CARE_PROVIDER_SITE_OTHER): Payer: No Typology Code available for payment source | Admitting: *Deleted

## 2022-01-06 ENCOUNTER — Encounter (HOSPITAL_COMMUNITY): Payer: No Typology Code available for payment source

## 2022-01-06 DIAGNOSIS — Z5181 Encounter for therapeutic drug level monitoring: Secondary | ICD-10-CM

## 2022-01-06 DIAGNOSIS — Z952 Presence of prosthetic heart valve: Secondary | ICD-10-CM

## 2022-01-06 LAB — POCT INR: INR: 2.2 (ref 2.0–3.0)

## 2022-01-06 NOTE — Patient Instructions (Addendum)
Description   ?Today take 1/2 tablet then continue taking warfarin 1 tablet daily except for 1/2 tablet on Tuesday, Thursday, and Saturday. Stay consistent with your vitamin K intake.  Recheck INR in 2 weeks. ? ?Pt stopped taking amio 4/5  ? ?Anticoagulation Clinic [phone] Main [phone] ?  ?  ? ?

## 2022-01-08 ENCOUNTER — Other Ambulatory Visit (HOSPITAL_BASED_OUTPATIENT_CLINIC_OR_DEPARTMENT_OTHER): Payer: Self-pay | Admitting: Family

## 2022-01-08 ENCOUNTER — Encounter (HOSPITAL_COMMUNITY)
Admission: RE | Admit: 2022-01-08 | Discharge: 2022-01-08 | Disposition: A | Discharge status: Home / Self Care | Payer: No Typology Code available for payment source | Source: Ambulatory Visit | Attending: Cardiology | Admitting: Cardiology

## 2022-01-08 DIAGNOSIS — Z8679 Personal history of other diseases of the circulatory system: Secondary | ICD-10-CM | POA: Diagnosis not present

## 2022-01-08 DIAGNOSIS — I4891 Unspecified atrial fibrillation: Secondary | ICD-10-CM

## 2022-01-08 DIAGNOSIS — Z9889 Other specified postprocedural states: Secondary | ICD-10-CM | POA: Diagnosis not present

## 2022-01-08 DIAGNOSIS — Z95828 Presence of other vascular implants and grafts: Secondary | ICD-10-CM

## 2022-01-08 DIAGNOSIS — Z952 Presence of prosthetic heart valve: Secondary | ICD-10-CM

## 2022-01-08 MED ORDER — AMIODARONE HCL 200 MG PO TABS: 200.0000 mg | ORAL_TABLET | Freq: Every day | ORAL | 2 refills | Status: DC

## 2022-01-08 NOTE — Addendum Note (Signed)
Addended by: Carter Kitten D on: 01/08/2022 02:21 PM ? ? Modules accepted: Orders ? ?

## 2022-01-09 ENCOUNTER — Ambulatory Visit (INDEPENDENT_AMBULATORY_CARE_PROVIDER_SITE_OTHER): Payer: No Typology Code available for payment source | Admitting: Cardiology

## 2022-01-09 ENCOUNTER — Encounter: Payer: Self-pay | Admitting: Cardiology

## 2022-01-09 DIAGNOSIS — Z952 Presence of prosthetic heart valve: Secondary | ICD-10-CM

## 2022-01-09 DIAGNOSIS — E785 Hyperlipidemia, unspecified: Secondary | ICD-10-CM | POA: Diagnosis not present

## 2022-01-09 DIAGNOSIS — I2510 Atherosclerotic heart disease of native coronary artery without angina pectoris: Secondary | ICD-10-CM | POA: Diagnosis not present

## 2022-01-09 DIAGNOSIS — I480 Paroxysmal atrial fibrillation: Secondary | ICD-10-CM | POA: Insufficient documentation

## 2022-01-09 DIAGNOSIS — Z9861 Coronary angioplasty status: Secondary | ICD-10-CM

## 2022-01-09 MED ORDER — METOPROLOL TARTRATE 50 MG PO TABS
25.0000 mg | ORAL_TABLET | Freq: Two times a day (BID) | ORAL | 0 refills | Status: DC
Start: 2022-01-09 — End: 2022-07-14

## 2022-01-09 NOTE — Progress Notes (Signed)
?Cardiology Office Note:   ? ?Date:  01/09/2022  ? ?ID:  Gabriel Anderson, DOB [birth year], MRN [id number] ? ?PCP:  Eunice Blase, MD ?  ?[facility] Providers ?Cardiologist:  Candee Furbish, MD    ? ?Referring MD: No ref. provider found  ? ? ?History of Present Illness:   ? ?Gabriel Anderson is a 51 y.o. male here for follow-up visit aortic valve replacement.AVR using a 23 mm On-X mechanical valve and supra-coronary replacement of an ascending aortic aneurysm on 09/16/2021.His INR has been followed in the Coumadin clinic and was 2.5 on 10/18/2021. ? ? PCI to the LAD XT in 2013 ? ?Breathing is amazing.  ? ?Metoprolol - ED side effect. 50 bid. Cardiac rehab. Will cut back. Off amio now. PAF post op.  ? 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 Hands and feet peeled. Age 16.  ? ?[employer] football scholarship.  ? ?Past Medical History:  ?Diagnosis Date  ? Acute myocardial infarction, subendocardial infarction, initial episode of care [facility]) 2013  ? Cardiac catheterization 06/02/12-widely patent previously placed LAD stents. Troponin peaked 3. Normal EF.  ? Anemia   ? Arthritis   ? Ascending aortic aneurysm ([place]) 08/26/2021  ? 4.4 cm on 05/16/21 CT  ? Coronary atherosclerosis of native coronary artery 05/21/2012  ? Cardiac catheterization 05/21/12-95% mid LAD lesion at the bifurcation of a large diagonal branch. Normal ejection fraction.  ? Dyspnea   ? Heart murmur   ? aortic stenosis  ? Hyperlipidemia   ? Hypertension   ? Pneumonia   ? Raynaud's disease   ? Vasospasm ([place])   ? ? ?Past Surgical History:  ?Procedure Laterality Date  ?  cardiacstent x 2  2013  ? AORTIC VALVE REPLACEMENT N/A 09/16/2021  ? Procedure: AORTIC VALVE REPLACEMENT (AVR) USING ON-X 23MM PROSTHETIC VALVE;  Surgeon: Gaye Pollack, MD;  Location: [place] OR;  Service: Open Heart Surgery;  Laterality: N/A;  Circ Arrest  ? CARDIAC CATHETERIZATION  05/2012  ? PCI LAD at takeoff of large diagonal  ? ELBOW SURGERY Left 1984  ? LEFT HEART CATHETERIZATION WITH CORONARY ANGIOGRAM N/A  05/21/2012  ? Procedure: LEFT HEART CATHETERIZATION WITH CORONARY ANGIOGRAM;  Surgeon: Jettie Booze, MD;  Location: [facility] CATH LAB;  Service: Cardiovascular;  Laterality: N/A;  ? LEFT HEART CATHETERIZATION WITH CORONARY ANGIOGRAM N/A 06/02/2012  ? Procedure: LEFT HEART CATHETERIZATION WITH CORONARY ANGIOGRAM;  Surgeon: Candee Furbish, MD;  Location: [facility] CATH LAB;  Service: Cardiovascular;  Laterality: N/A;  ? PERCUTANEOUS CORONARY STENT INTERVENTION (PCI-S)  05/21/2012  ? Procedure: PERCUTANEOUS CORONARY STENT INTERVENTION (PCI-S);  Surgeon: Jettie Booze, MD;  Location: [facility] CATH LAB;  Service: Cardiovascular;;  ? REPLACEMENT ASCENDING AORTA N/A 09/16/2021  ? Procedure: REPLACEMENT OF ASCENDING AORTA USING HEMASHIELD PLATINUM 28MM X10MM X50CM GRAFT;  Surgeon: Gaye Pollack, MD;  Location: [place];  Service: Open Heart Surgery;  Laterality: N/A;  ? REVERSE SHOULDER ARTHROPLASTY Right 05/23/2020  ? Procedure: REVERSE SHOULDER ARTHROPLASTY;  Surgeon: Hiram Gash, MD;  Location: [facility] ORS;  Service: Orthopedics;  Laterality: Right;  ? RIGHT/LEFT HEART CATH AND CORONARY ANGIOGRAPHY N/A 12/19/2020  ? Procedure: RIGHT/LEFT HEART CATH AND CORONARY ANGIOGRAPHY;  Surgeon: Leonie Man, MD;  Location: [place] CV LAB;  Service: Cardiovascular;  Laterality: N/A;  ? ROTATOR CUFF REPAIR Right 2009  ? TEE WITHOUT CARDIOVERSION N/A 09/16/2021  ? Procedure: TRANSESOPHAGEAL ECHOCARDIOGRAM (TEE);  Surgeon: Gaye Pollack, MD;  Location: [place];  Service: Open Heart Surgery;  Laterality: N/A;  ?  TOTAL HIP ARTHROPLASTY Left 12/30/2013  ? Procedure: LEFT TOTAL HIP ARTHROPLASTY ANTERIOR APPROACH;  Surgeon: Kathryne Hitch, MD;  Location: [facility] ORS;  Service: Orthopedics;  Laterality: Left;  ? ? ?Current Medications: ?Current Meds  ?Medication Sig  ? acetaminophen (TYLENOL) 500 MG tablet Take 1,000 mg by mouth every 8 (eight) hours as needed for moderate pain.  ? amoxicillin (AMOXIL) 500 MG tablet Take 2,000 mg by mouth See admin  instructions. Take 2000 mg by mouth one hour prior to dental procedures  ? aspirin 81 MG tablet Take 1 tablet (81 mg total) by mouth daily.  ? nitroGLYCERIN (NITROSTAT) 0.4 MG SL tablet Place 1 tablet (0.4 mg total) under the tongue every 5 (five) minutes as needed. Max of 3 doses, then 911  ? rosuvastatin (CRESTOR) 40 MG tablet TAKE 1 TABLET BY MOUTH EVERY DAY (Patient taking differently: Take 40 mg by mouth daily.)  ? warfarin (COUMADIN) 5 MG tablet Take 1 tablet (5 mg total) by mouth daily at 4 PM. Or as instructed by the Coumadin Clinic. (Patient taking differently: Take 2.5 mg by mouth See admin instructions. Or as instructed by the Coumadin Clinic, 2.5 mg on Tues, Thurs and Sat, 5 mg all other days)  ? [DISCONTINUED] metoprolol tartrate (LOPRESSOR) 50 MG tablet Take 1 tablet (50 mg total) by mouth 2 (two) times daily.  ?  ? ?Allergies:   Patient has no known allergies.  ? ?Social History  ? ?Socioeconomic History  ? Marital status: Married  ?  Spouse name: Gabriel Anderson  ? Number of children: 2  ? Years of education: 104  ? Highest education level: Not on file  ?Occupational History  ? Not on file  ?Tobacco Use  ? Smoking status: Never  ? Smokeless tobacco: Never  ?Vaping Use  ? Vaping Use: Never used  ?Substance and Sexual Activity  ? Alcohol use: Yes  ?  Alcohol/week: 11.0 standard drinks  ?  Types: 5 Cans of beer, 2 Shots of liquor, 4 Standard drinks or equivalent per week  ?  Comment: moderate - cutting back from 28 beers  ? Drug use: Yes  ?  Frequency: 7.0 times per week  ?  Types: Marijuana  ?  Comment: marijuana 0.05 grams smoked  per day for joint pain  ? Sexual activity: Yes  ?  Partners: Female  ?Other Topics Concern  ? Not on file  ?Social History Narrative  ? Not on file  ? ?Social Determinants of Health  ? ?Financial Resource Strain: Not on file  ?Food Insecurity: Not on file  ?Transportation Needs: Not on file  ?Physical Activity: Not on file  ?Stress: Not on file  ?Social Connections: Not on file  ?   ? ?Family History: ?The patient's family history includes COPD in his mother; Diabetes in his father; Heart disease in his father; Heart failure in his father; Hypertension in his father; Stroke in his sister. ? ?ROS:   ?Please see the history of present illness.    ?No fevers chills nausea vomiting syncope bleeding all other systems reviewed and are negative. ? ?EKGs/Labs/Other Studies Reviewed:   ? ?The following studies were reviewed today: ?OR notes reviewed.  Prior cath, echo reviewed.  Postop gradient 7 mmHg. ? ? ?Recent Labs: ?09/12/2021: ALT 37 ?09/17/2021: Magnesium 2.3 ?09/19/2021: TSH 1.317 ?10/03/2021: BUN 15; Creatinine, Ser 1.01; Hemoglobin 12.1; Platelets 768; Potassium 5.4; Sodium 140  ?Recent Lipid Panel ?   ?Component Value Date/Time  ? CHOL 109 03/27/2020 1111  ?  CHOL 151 10/26/2018 0959  ? TRIG 76 03/27/2020 1111  ? HDL 42 03/27/2020 1111  ? HDL 44 10/26/2018 0959  ? CHOLHDL 2.6 03/27/2020 1111  ? VLDL 16.0 02/12/2015 0909  ? LDLCALC 51 03/27/2020 1111  ? LDLDIRECT 71 10/03/2021 1507  ? LDLDIRECT 150.0 09/19/2013 1050  ? ? ? ?Risk Assessment/Calculations:   ? ? ?    ? ?   ? ?Physical Exam:   ? ?VS:  BP 116/74   Pulse (!) 53   Ht 5\' 10"  (1.778 m)   Wt 256 lb 9.6 oz (116.4 kg)   SpO2 97%   BMI 36.82 kg/m?    ? ?Wt Readings from Last 3 Encounters:  ?01/09/22 256 lb 9.6 oz (116.4 kg)  ?12/11/21 256 lb (116.1 kg)  ?12/05/21 257 lb 11.5 oz (116.9 kg)  ?  ? ?GEN:  Well nourished, well developed in no acute distress ?HEENT: Normal ?NECK: No JVD; No carotid bruits ?LYMPHATICS: No lymphadenopathy ?CARDIAC: Sharp S2 click RRR, no murmurs, no rubs, gallops ?RESPIRATORY:  Clear to auscultation without rales, wheezing or rhonchi  ?ABDOMEN: Soft, non-tender, non-distended ?MUSCULOSKELETAL:  No edema; No deformity  ?SKIN: Warm and dry ?NEUROLOGIC:  Alert and oriented x 3 ?PSYCHIATRIC:  Normal affect  ? ?ASSESSMENT:   ? ?1. S/P AVR (aortic valve replacement)   ?2. Dyslipidemia, goal LDL below 70   ?3. CAD S/P  percutaneous coronary angioplasty   ?4. Paroxysmal atrial fibrillation (HCC)   ? ?PLAN:   ? ?In order of problems listed above: ? ?S/P AVR (aortic valve replacement) ?Doing very well with current valve.  Goal INR 2

## 2022-01-09 NOTE — Assessment & Plan Note (Signed)
Continue with Crestor 40.  Excellent.  LDL goal less than 70.  Previously 51. ?

## 2022-01-09 NOTE — Assessment & Plan Note (Signed)
2 overlapping Promus DES 2013.  Doing well without any anginal symptoms.  Continue with goal-directed medical therapy. ?

## 2022-01-09 NOTE — Assessment & Plan Note (Signed)
Doing very well with current valve.  Goal INR 2.5.  Dental prophylaxis discussed.  Continue with aspirin lifelong.  Coumadin.  No bleeding.  We will cut back his metoprolol from 50-25 twice a day. ?

## 2022-01-09 NOTE — Assessment & Plan Note (Signed)
Noted postoperatively.  Brief.  Took amiodarone.  He is now off. ?

## 2022-01-09 NOTE — Patient Instructions (Signed)
Medication Instructions:  ?Please decrease Metoprolol to 25 mg twice a day. ?Continue all other medications as listed. ? ?*If you need a refill on your cardiac medications before your next appointment, please call your pharmacy* ? ?Follow-Up: ?At [facility], you and your health needs are our priority.  As part of our continuing mission to provide you with exceptional heart care, we have created designated Provider Care Teams.  These Care Teams include your primary Cardiologist (physician) and Advanced Practice Providers (APPs -  Physician Assistants and Nurse Practitioners) who all work together to provide you with the care you need, when you need it. ? ?We recommend signing up for the patient portal called "MyChart".  Sign up information is provided on this After Visit Summary.  MyChart is used to connect with patients for Virtual Visits (Telemedicine).  Patients are able to view lab/test results, encounter notes, upcoming appointments, etc.  Non-urgent messages can be sent to your provider as well.   ?To learn more about what you can do with MyChart, go to [email or web address].   ? ?Your next appointment:   ?6 month(s) ? ?The format for your next appointment:   ?In Person ? ?Provider:   ?Candee Furbish, MD { ? ? ?Important Information About Sugar ? ? ? ? ?  ?

## 2022-01-10 ENCOUNTER — Encounter (HOSPITAL_COMMUNITY)
Admission: RE | Admit: 2022-01-10 | Discharge: 2022-01-10 | Disposition: A | Discharge status: Home / Self Care | Payer: No Typology Code available for payment source | Source: Ambulatory Visit | Attending: Cardiology | Admitting: Cardiology

## 2022-01-10 DIAGNOSIS — Z952 Presence of prosthetic heart valve: Secondary | ICD-10-CM

## 2022-01-10 DIAGNOSIS — Z95828 Presence of other vascular implants and grafts: Secondary | ICD-10-CM

## 2022-01-13 ENCOUNTER — Encounter (HOSPITAL_COMMUNITY)
Admission: RE | Admit: 2022-01-13 | Discharge: 2022-01-13 | Disposition: A | Discharge status: Home / Self Care | Payer: No Typology Code available for payment source | Source: Ambulatory Visit | Attending: Cardiology | Admitting: Cardiology

## 2022-01-13 DIAGNOSIS — Z952 Presence of prosthetic heart valve: Secondary | ICD-10-CM | POA: Diagnosis not present

## 2022-01-13 DIAGNOSIS — Z95828 Presence of other vascular implants and grafts: Secondary | ICD-10-CM

## 2022-01-14 NOTE — Progress Notes (Signed)
Cardiac Individual Treatment Plan ? ?Patient Details  ?Name: Gabriel Anderson ?MRN: [id number] ?Date of Birth: [birth year] ?Referring Provider:   ?Flowsheet Row CARDIAC REHAB PHASE II ORIENTATION from 12/05/2021 in [place]  ?Referring Provider Candee Furbish, MD  ? ?  ? ? ?Initial Encounter Date:  ?Flowsheet Row CARDIAC REHAB PHASE II ORIENTATION from 12/05/2021 in [place]  ?Date 12/05/21  ? ?  ? ? ?Visit Diagnosis: 09/16/21 S/P AVR (aortic valve replacement) ? ?09/16/21 Replacement of ascending aorta ? ?Patient's Home Medications on Admission: ? ?Current Outpatient Medications:  ?  acetaminophen (TYLENOL) 500 MG tablet, Take 1,000 mg by mouth every 8 (eight) hours as needed for moderate pain., Disp: , Rfl:  ?  amoxicillin (AMOXIL) 500 MG tablet, Take 2,000 mg by mouth See admin instructions. Take 2000 mg by mouth one hour prior to dental procedures, Disp: , Rfl:  ?  aspirin 81 MG tablet, Take 1 tablet (81 mg total) by mouth daily., Disp: , Rfl:  ?  metoprolol tartrate (LOPRESSOR) 50 MG tablet, Take 0.5 tablets (25 mg total) by mouth 2 (two) times daily., Disp: , Rfl: 0 ?  nitroGLYCERIN (NITROSTAT) 0.4 MG SL tablet, Place 1 tablet (0.4 mg total) under the tongue every 5 (five) minutes as needed. Max of 3 doses, then 911, Disp: 25 tablet, Rfl: 6 ?  rosuvastatin (CRESTOR) 40 MG tablet, TAKE 1 TABLET BY MOUTH EVERY DAY (Patient taking differently: Take 40 mg by mouth daily.), Disp: 90 tablet, Rfl: 1 ?  warfarin (COUMADIN) 5 MG tablet, Take 1 tablet (5 mg total) by mouth daily at 4 PM. Or as instructed by the Coumadin Clinic. (Patient taking differently: Take 2.5 mg by mouth See admin instructions. Or as instructed by the Coumadin Clinic, 2.5 mg on Tues, Thurs and Sat, 5 mg all other days), Disp: 30 tablet, Rfl: 11 ? ?Past Medical History: ?Past Medical History:  ?Diagnosis Date  ? Acute myocardial infarction, subendocardial infarction, initial episode of  care [facility]) 2013  ? Cardiac catheterization 06/02/12-widely patent previously placed LAD stents. Troponin peaked 3. Normal EF.  ? Anemia   ? Arthritis   ? Ascending aortic aneurysm ([place]) 08/26/2021  ? 4.4 cm on 05/16/21 CT  ? Coronary atherosclerosis of native coronary artery 05/21/2012  ? Cardiac catheterization 05/21/12-95% mid LAD lesion at the bifurcation of a large diagonal branch. Normal ejection fraction.  ? Dyspnea   ? Heart murmur   ? aortic stenosis  ? Hyperlipidemia   ? Hypertension   ? Pneumonia   ? Raynaud's disease   ? Vasospasm ([place])   ? ? ?Tobacco Use: ?Social History  ? ?Tobacco Use  ?Smoking Status Never  ?Smokeless Tobacco Never  ? ? ?Labs: ?Review Flowsheet   ? ?  ?  Latest Ref Rng & Units 03/27/2020 12/19/2020 09/12/2021 09/16/2021  ?Labs for ITP Cardiac and Pulmonary Rehab  ?Cholestrol <200 mg/dL 109       ?LDL (calc) mg/dL (calc) 51       ?Direct LDL 0 - 99 mg/dL      ?HDL-C > OR = 40 mg/dL 42       ?Trlycerides <150 mg/dL 76       ?Hemoglobin A1c 4.8 - 5.6 % 5.5    5.3     ?PH, Arterial 7.350 - 7.450  7.342   7.421   7.338    ? 7.373    ? 7.359    ? 7.357    ?  7.399    ? 7.438    ? 7.382    ? 7.379    ?PCO2 arterial 32.0 - 48.0 mmHg  47.1   40.0   46.9    ? 40.4    ? 45.9    ? 46.3    ? 43.4    ? 36.3    ? 54.4    ? 48.2    ?Bicarbonate 20.0 - 28.0 mmol/L  24.8    ? 25.3    ? 25.6   25.5   25.3    ? 23.7    ? 26.4    ? 26.0    ? 26.9    ? 24.6    ? 27.8    ? 32.4    ? 28.5    ?TCO2 22 - 32 mmol/L  26    ? 27    ? 27    27    ? 25    ? 28    ? 27    ? 27    ? 27    ? 28    ? 26    ? 25    ? 29    ? 34    ? 28    ? 30    ? 28    ?Acid-base deficit 0.0 - 2.0 mmol/L  2.0    ? 1.0    ? 1.0    1.0    ? 2.0    ?O2 Saturation %  73.0    ? 78.0    ? 99.0   97.7   99.0    ? 99.0    ? 100.0    ? 100.0    ? 100.0    ? 100.0    ? 95.0    ? 100.0    ? 100.0    ? ?  10/03/2021  ?Labs for ITP Cardiac and Pulmonary Rehab  ?Cholestrol   ?LDL (calc)   ?Direct LDL 71    ?HDL-C   ?Trlycerides   ?Hemoglobin A1c   ?PH,  Arterial   ?PCO2 arterial   ?Bicarbonate   ?TCO2   ?Acid-base deficit   ?O2 Saturation   ?  ? ? Multiple values from one day are sorted in reverse-chronological order  ?  ?  ? ? ?Capillary Blood Glucose: ?Lab Results  ?Component Value Date  ? GLUCAP 130 (H) 09/18/2021  ? GLUCAP 135 (H) 09/18/2021  ? GLUCAP 113 (H) 09/18/2021  ? GLUCAP 131 (H) 09/17/2021  ? GLUCAP 61 (L) 09/17/2021  ? ? ? ?Exercise Target Goals: ?Exercise Program Goal: ?Individual exercise prescription set using results from initial 6 min walk test and THRR while considering  patient?s activity barriers and safety.  ? ?Exercise Prescription Goal: ?Starting with aerobic activity 30 plus minutes a day, 3 days per week for initial exercise prescription. Provide home exercise prescription and guidelines that participant acknowledges understanding prior to discharge. ? ?Activity Barriers & Risk Stratification: ? Activity Barriers & Cardiac Risk Stratification - 12/05/21 1241   ? ?  ? Activity Barriers & Cardiac Risk Stratification  ? Activity Barriers Arthritis;Left Hip Replacement;Joint Problems   Right shoulder replacement; chroinc right hip pain  ? Cardiac Risk Stratification High   ? ?  ?  ? ?  ? ? ?6 Minute Walk: ? 6 Minute Walk   ? ? [place] Name 12/05/21 1237  ?  ?  ?  ? 6 Minute Walk  ? Phase Initial    ?  Distance 1512 feet    ? Walk Time 6 minutes    ? # of Rest Breaks 0    ? MPH 2.86    ? METS 3.61    ? RPE 8    ? Perceived Dyspnea  0    ? VO2 Peak 12.64    ? Symptoms Yes (comment)    ? Comments Rt hip pain 6/10, stiff thights, no pain    ? Resting HR 46 bpm    ? Resting BP 114/78    ? Resting Oxygen Saturation  97 %    ? Exercise Oxygen Saturation  during 6 min walk 98 %    ? Max Ex. HR 89 bpm    ? Max Ex. BP 144/68    ? 2 Minute Post BP 124/68    ? ?  ?  ? ?  ? ? ?Oxygen Initial Assessment: ? ? ?Oxygen Re-Evaluation: ? ? ?Oxygen Discharge (Final Oxygen Re-Evaluation): ? ? ?Initial Exercise Prescription: ? Initial Exercise Prescription - 12/05/21  1200   ? ?  ? Date of Initial Exercise RX and Referring Provider  ? Date 12/05/21   ? Referring Provider Candee Furbish, MD   ? Expected Discharge Date 01/31/22   ?  ? [place] Bike  ? Level 2.5   ? Minutes 15   ? METs 3.6   ?  ? Arm Ergometer  ? Level 2.5   ? Minutes 15   ? METs 3   ?  ? Prescription Details  ? Frequency (times per week) 3   ? Duration Progress to 30 minutes of continuous aerobic without signs/symptoms of physical distress   ?  ? Intensity  ? THRR 40-80% of Max Heartrate 67-134   ? Ratings of Perceived Exertion 11-13   ? Perceived Dyspnea 0-4   ?  ? Progression  ? Progression Continue progressive overload as per policy without signs/symptoms or physical distress.   ?  ? Resistance Training  ? Training Prescription Yes   ? Weight 5 lbs   ? Reps 10-15   ? ?  ?  ? ?  ? ? ?Perform Capillary Blood Glucose checks as needed. ? ?Exercise Prescription Changes: ? ? Exercise Prescription Changes   ? ? [place] Name 12/09/21 1400 12/27/21 1400  ?  ?  ?  ?  ? Response to Exercise  ? Blood Pressure (Admit) 122/68 120/70     ? Blood Pressure (Exercise) 160/80 136/76     ? Blood Pressure (Exit) 120/80 108/60     ? Heart Rate (Admit) 42 bpm 57 bpm     ? Heart Rate (Exercise) 85 bpm 82 bpm     ? Heart Rate (Exit) 56 bpm 54 bpm     ? Rating of Perceived Exertion (Exercise) 11 10.5     ? Symptoms Chronic right hips pain 7/10 on bike Injured knee working. No pain today but used arm ergrometer only today     ? Comments Pt's first day in the CRP2 program Reviewed METs, goals and home exercise RX     ? Duration Continue with 30 min of aerobic exercise without signs/symptoms of physical distress. Continue with 30 min of aerobic exercise without signs/symptoms of physical distress.     ? Intensity THRR unchanged THRR unchanged     ?  ? Progression  ? Progression Continue to progress workloads to maintain intensity without signs/symptoms of physical distress. Continue to progress workloads to maintain intensity without  signs/symptoms of physical  distress.     ? Average METs 2.35 2.8     ?  ? Resistance Training  ? Training Prescription Yes Yes     ? Weight 5 lbs 5 lbs     ? Reps 10-15 10-15     ? Time 10 Minutes 10 Minutes     ?  ? Danielle Rankin

## 2022-01-15 ENCOUNTER — Encounter (HOSPITAL_COMMUNITY)
Admission: RE | Admit: 2022-01-15 | Discharge: 2022-01-15 | Disposition: A | Discharge status: Home / Self Care | Payer: No Typology Code available for payment source | Source: Ambulatory Visit | Attending: Cardiology | Admitting: Cardiology

## 2022-01-15 DIAGNOSIS — Z952 Presence of prosthetic heart valve: Secondary | ICD-10-CM

## 2022-01-17 ENCOUNTER — Encounter (HOSPITAL_COMMUNITY)
Admission: RE | Admit: 2022-01-17 | Discharge: 2022-01-17 | Disposition: A | Discharge status: Home / Self Care | Payer: No Typology Code available for payment source | Source: Ambulatory Visit | Attending: Cardiology | Admitting: Cardiology

## 2022-01-17 DIAGNOSIS — Z952 Presence of prosthetic heart valve: Secondary | ICD-10-CM

## 2022-01-17 DIAGNOSIS — Z95828 Presence of other vascular implants and grafts: Secondary | ICD-10-CM

## 2022-01-20 ENCOUNTER — Encounter (HOSPITAL_COMMUNITY)
Admission: RE | Admit: 2022-01-20 | Discharge: 2022-01-20 | Disposition: A | Discharge status: Home / Self Care | Payer: No Typology Code available for payment source | Source: Ambulatory Visit | Attending: Cardiology | Admitting: Cardiology

## 2022-01-20 DIAGNOSIS — Z952 Presence of prosthetic heart valve: Secondary | ICD-10-CM

## 2022-01-20 DIAGNOSIS — Z95828 Presence of other vascular implants and grafts: Secondary | ICD-10-CM

## 2022-01-22 ENCOUNTER — Encounter (HOSPITAL_COMMUNITY)
Admission: RE | Admit: 2022-01-22 | Discharge: 2022-01-22 | Disposition: A | Discharge status: Home / Self Care | Payer: No Typology Code available for payment source | Source: Ambulatory Visit | Attending: Cardiology | Admitting: Cardiology

## 2022-01-22 DIAGNOSIS — Z952 Presence of prosthetic heart valve: Secondary | ICD-10-CM | POA: Diagnosis not present

## 2022-01-22 DIAGNOSIS — Z95828 Presence of other vascular implants and grafts: Secondary | ICD-10-CM

## 2022-01-24 ENCOUNTER — Ambulatory Visit (INDEPENDENT_AMBULATORY_CARE_PROVIDER_SITE_OTHER): Payer: No Typology Code available for payment source

## 2022-01-24 ENCOUNTER — Encounter (HOSPITAL_COMMUNITY)
Admission: RE | Admit: 2022-01-24 | Discharge: 2022-01-24 | Disposition: A | Discharge status: Home / Self Care | Payer: No Typology Code available for payment source | Source: Ambulatory Visit | Attending: Cardiology | Admitting: Cardiology

## 2022-01-24 DIAGNOSIS — Z952 Presence of prosthetic heart valve: Secondary | ICD-10-CM

## 2022-01-24 DIAGNOSIS — Z95828 Presence of other vascular implants and grafts: Secondary | ICD-10-CM

## 2022-01-24 DIAGNOSIS — Z5181 Encounter for therapeutic drug level monitoring: Secondary | ICD-10-CM | POA: Diagnosis not present

## 2022-01-24 LAB — POCT INR: INR: 1 — AB (ref 2.0–3.0)

## 2022-01-24 NOTE — Patient Instructions (Signed)
Description   Take 2 tablets today and then continue taking warfarin 1 tablet daily except for 1/2 tablet on Tuesday, Thursday, and Saturday.  Stay consistent with your vitamin K intake.   Recheck INR in 5 days.  Pt stopped taking amio 4/5   Anticoagulation Clinic [phone] Main [phone]

## 2022-01-27 ENCOUNTER — Encounter (HOSPITAL_COMMUNITY)
Admission: RE | Admit: 2022-01-27 | Discharge: 2022-01-27 | Disposition: A | Discharge status: Home / Self Care | Payer: No Typology Code available for payment source | Source: Ambulatory Visit | Attending: Cardiology | Admitting: Cardiology

## 2022-01-27 VITALS — Ht 69.75 in | Wt 258.8 lb

## 2022-01-27 DIAGNOSIS — Z952 Presence of prosthetic heart valve: Secondary | ICD-10-CM

## 2022-01-27 DIAGNOSIS — Z95828 Presence of other vascular implants and grafts: Secondary | ICD-10-CM

## 2022-01-29 ENCOUNTER — Ambulatory Visit (INDEPENDENT_AMBULATORY_CARE_PROVIDER_SITE_OTHER): Payer: No Typology Code available for payment source | Admitting: *Deleted

## 2022-01-29 ENCOUNTER — Encounter (HOSPITAL_COMMUNITY)
Admission: RE | Admit: 2022-01-29 | Discharge: 2022-01-29 | Disposition: A | Discharge status: Home / Self Care | Payer: No Typology Code available for payment source | Source: Ambulatory Visit | Attending: Cardiology | Admitting: Cardiology

## 2022-01-29 DIAGNOSIS — Z952 Presence of prosthetic heart valve: Secondary | ICD-10-CM

## 2022-01-29 DIAGNOSIS — Z5181 Encounter for therapeutic drug level monitoring: Secondary | ICD-10-CM

## 2022-01-29 DIAGNOSIS — Z95828 Presence of other vascular implants and grafts: Secondary | ICD-10-CM

## 2022-01-29 LAB — POCT INR: INR: 1.8 — AB (ref 2.0–3.0)

## 2022-01-29 NOTE — Patient Instructions (Signed)
Description   Continue taking warfarin 1 tablet daily except for 1/2 tablet on Tuesday, Thursday, and Saturday. Stay consistent with your vitamin K intake. Recheck INR in 3 weeks.  Pt stopped taking amio 4/5   Anticoagulation Clinic [phone] Main [phone]

## 2022-01-29 NOTE — Progress Notes (Incomplete)
Discharge Progress Report  Patient Details  Name: Gabriel Anderson MRN: [id number] Date of Birth: 1971-01-02 Referring Provider:   Flowsheet Row CARDIAC REHAB PHASE II ORIENTATION from 12/05/2021 in [facility]  Referring Provider Candee Furbish, MD        Number of Visits: ***  Reason for Discharge:  {CHL AMB CP REHAB REASON FOR [phone]}  Smoking History:  Social History   Tobacco Use  Smoking Status Never  Smokeless Tobacco Never    Diagnosis:  09/16/21 S/P AVR (aortic valve replacement)  09/16/21 Replacement of ascending aorta  ADL [facility]:   Initial Exercise Prescription:  Initial Exercise Prescription - 12/05/21 1200       Date of Initial Exercise RX and Referring Provider   Date 12/05/21    Referring Provider Candee Furbish, MD    Expected Discharge Date 01/31/22      Recumbant Bike   Level 2.5    Minutes 15    METs 3.6      Arm Ergometer   Level 2.5    Minutes 15    METs 3      Prescription Details   Frequency (times per week) 3    Duration Progress to 30 minutes of continuous aerobic without signs/symptoms of physical distress      Intensity   THRR 40-80% of Max Heartrate 67-134    Ratings of Perceived Exertion 11-13    Perceived Dyspnea 0-4      Progression   Progression Continue progressive overload as per policy without signs/symptoms or physical distress.      Resistance Training   Training Prescription Yes    Weight 5 lbs    Reps 10-15             Discharge Exercise Prescription (Final Exercise Prescription Changes):  Exercise Prescription Changes - 01/22/22 1600       Response to Exercise   Blood Pressure (Admit) 124/82    Blood Pressure (Exercise) 166/72    Blood Pressure (Exit) 102/58    Heart Rate (Admit) 66 bpm    Heart Rate (Exercise) 106 bpm    Heart Rate (Exit) 66 bpm    Rating of Perceived Exertion (Exercise) 12    Symptoms None    Comments Reviewed Goals/Mets    Duration  Continue with 30 min of aerobic exercise without signs/symptoms of physical distress.    Intensity THRR unchanged      Progression   Progression Continue to progress workloads to maintain intensity without signs/symptoms of physical distress.    Average METs 2.85      Resistance Training   Training Prescription No    Weight No weights on Wednesdays      Interval Training   Interval Training No      NuStep   Level 3    Minutes 15    METs 2.7      Arm Ergometer   Level 3    Minutes 15    METs 3      Home Exercise Plan   Plans to continue exercise at Home (comment)    Frequency Add 2 additional days to program exercise sessions.    Initial Home Exercises Provided 12/27/21             Functional Capacity:  6 Minute Walk     Row Name 12/05/21 1237 01/27/22 1309       6 Minute Walk   Phase Initial Discharge    Distance 1512 feet 1521  feet    Distance % Change -- 0.6 %    Distance Feet Change -- 9 ft    Walk Time 6 minutes 6 minutes    # of Rest Breaks 0 0    MPH 2.86 2.9    METS 3.61 3.91    RPE 8 11.5    Perceived Dyspnea  0 0    VO2 Peak 12.64 13.69    Symptoms Yes (comment) No    Comments Rt hip pain 6/10, stiff thights, no pain Rt hip pain 5/10    Resting HR 46 bpm 67 bpm    Resting BP 114/78 122/78    Resting Oxygen Saturation  97 % --    Exercise Oxygen Saturation  during 6 min walk 98 % --    Max Ex. HR 89 bpm 101 bpm    Max Ex. BP 144/68 166/78    2 Minute Post BP 124/68 --             Psychological, QOL, Others - Outcomes: PHQ 2/9:    12/06/2021    8:15 AM 12/05/2021   11:26 AM 03/27/2020   10:39 AM 11/01/2018    1:27 PM 06/22/2012   10:13 AM  Depression screen PHQ 2/9  Decreased Interest 0 0 0 0 0  Down, Depressed, Hopeless 0 0 0 0 0  PHQ - 2 Score 0 0 0 0 0    Quality of Life:  Quality of Life - 01/20/22 1623       Quality of Life Scores   Health/Function Pre 20.5 %    Health/Function Post 28.07 %    Health/Function % Change  36.93 %    Socioeconomic Pre 22.93 %    Socioeconomic Post 30 %    Socioeconomic % Change  30.83 %    Psych/Spiritual Pre 19.63 %    Psych/Spiritual Post 28 %    Psych/Spiritual % Change 42.64 %    Family Pre 29.5 %    Family Post 30 %    Family % Change 1.69 %    GLOBAL Pre 22.22 %    GLOBAL Post 28.76 %    GLOBAL % Change 29.43 %             Personal Goals: Goals established at orientation with interventions provided to work toward goal.  Personal Goals and Risk Factors at Admission - 12/05/21 1232       Core Components/Risk Factors/Patient Goals on Admission    Weight Management Yes;Obesity;Weight Loss    Intervention Weight Management: Develop a combined nutrition and exercise program designed to reach desired caloric intake, while maintaining appropriate intake of nutrient and fiber, sodium and fats, and appropriate energy expenditure required for the weight goal.;Weight Management: Provide education and appropriate resources to help participant work on and attain dietary goals.;Weight Management/Obesity: Establish reasonable short term and long term weight goals.;Obesity: Provide education and appropriate resources to help participant work on and attain dietary goals.    Admit Weight 257 lb 11.5 oz (116.9 kg)    Goal Weight: Long Term 225 lb (102.1 kg)   pt goal   Expected Outcomes Short Term: Continue to assess and modify interventions until short term weight is achieved;Long Term: Adherence to nutrition and physical activity/exercise program aimed toward attainment of established weight goal;Weight Maintenance: Understanding of the daily nutrition guidelines, which includes 25-35% calories from fat, 7% or less cal from saturated fats, less than $RemoveB'200mg'MqPkfGvE$  cholesterol, less than 1.5gm of sodium, & 5 or more  servings of fruits and vegetables daily;Weight Loss: Understanding of general recommendations for a balanced deficit meal plan, which promotes 1-2 lb weight loss per week and  includes a negative energy balance of [phone] kcal/d;Understanding recommendations for meals to include 15-35% energy as protein, 25-35% energy from fat, 35-60% energy from carbohydrates, less than $RemoveB'200mg'erGhbmjb$  of dietary cholesterol, 20-35 gm of total fiber daily;Understanding of distribution of calorie intake throughout the day with the consumption of 4-5 meals/snacks    Hypertension Yes    Intervention Provide education on lifestyle modifcations including regular physical activity/exercise, weight management, moderate sodium restriction and increased consumption of fresh fruit, vegetables, and low fat dairy, alcohol moderation, and smoking cessation.;Monitor prescription use compliance.    Expected Outcomes Short Term: Continued assessment and intervention until BP is < 140/5mm HG in hypertensive participants. < 130/81mm HG in hypertensive participants with diabetes, heart failure or chronic kidney disease.;Long Term: Maintenance of blood pressure at goal levels.    Lipids Yes    Intervention Provide education and support for participant on nutrition & aerobic/resistive exercise along with prescribed medications to achieve LDL '70mg'$ , HDL >$Remo'40mg'gWOYQ$ .    Expected Outcomes Short Term: Participant states understanding of desired cholesterol values and is compliant with medications prescribed. Participant is following exercise prescription and nutrition guidelines.;Long Term: Cholesterol controlled with medications as prescribed, with individualized exercise RX and with personalized nutrition plan. Value goals: LDL < $Rem'70mg'VuBw$ , HDL > 40 mg.    Stress Yes    Intervention Offer individual and/or small group education and counseling on adjustment to heart disease, stress management and health-related lifestyle change. Teach and support self-help strategies.;Refer participants experiencing significant psychosocial distress to appropriate mental health specialists for further evaluation and treatment. When possible, include  family members and significant others in education/counseling sessions.    Expected Outcomes Short Term: Participant demonstrates changes in health-related behavior, relaxation and other stress management skills, ability to obtain effective social support, and compliance with psychotropic medications if prescribed.;Long Term: Emotional wellbeing is indicated by absence of clinically significant psychosocial distress or social isolation.              Personal Goals Discharge:  Goals and Risk Factor Review     Row Name 12/09/21 1439 12/17/21 1651 01/14/22 1659         Core Components/Risk Factors/Patient Goals Review   Personal Goals Review Weight Management/Obesity;Hypertension;Lipids;Stress Weight Management/Obesity;Hypertension;Lipids;Stress Weight Management/Obesity;Hypertension;Lipids;Stress     Review [name] started cardiac rehab on 12/09/21 and did well with exercise. [name] is off to a good start to exercise. Resting bradycardia. Remains asymptomatic. Cardiology aware. Will continue to monitor. [name]  has been doing well with exercise despite chronic hip pain. Vital signs have been stable. Weight unchanged. Resting heart rate has increased now the [name] is off amiodarone and metoprolol has been decreased by Dr Marlou Porch.     Expected Outcomes jeff will continue to participate in phase 2 cardiac rehab for exercise, nutrition and lifestyle modifications [name] will continue to participate in phase 2 cardiac rehab for exercise, nutrition and lifestyle modifications [name] will continue to participate in phase 2 cardiac rehab for exercise, nutrition and lifestyle modifications              Exercise Goals and Review:  Exercise Goals     Row Name 12/05/21 1245             Exercise Goals   Increase Physical Activity Yes       Intervention Provide advice, education, support and counseling about physical  activity/exercise needs.;Develop an individualized exercise prescription for aerobic and  resistive training based on initial evaluation findings, risk stratification, comorbidities and participant's personal goals.       Expected Outcomes Short Term: Attend rehab on a regular basis to increase amount of physical activity.;Long Term: Add in home exercise to make exercise part of routine and to increase amount of physical activity.;Long Term: Exercising regularly at least 3-5 days a week.       Increase Strength and Stamina Yes       Intervention Provide advice, education, support and counseling about physical activity/exercise needs.;Develop an individualized exercise prescription for aerobic and resistive training based on initial evaluation findings, risk stratification, comorbidities and participant's personal goals.       Able to understand and use rate of perceived exertion (RPE) scale Yes       Intervention Provide education and explanation on how to use RPE scale       Expected Outcomes Short Term: Able to use RPE daily in rehab to express subjective intensity level;Long Term:  Able to use RPE to guide intensity level when exercising independently       Knowledge and understanding of Target Heart Rate Range (THRR) Yes       Intervention Provide education and explanation of THRR including how the numbers were predicted and where they are located for reference       Expected Outcomes Short Term: Able to state/look up THRR;Short Term: Able to use daily as guideline for intensity in rehab;Long Term: Able to use THRR to govern intensity when exercising independently       Understanding of Exercise Prescription Yes       Intervention Provide education, explanation, and written materials on patient's individual exercise prescription       Expected Outcomes Short Term: Able to explain program exercise prescription;Long Term: Able to explain home exercise prescription to exercise independently                Exercise Goals Re-Evaluation:  Exercise Goals Re-Evaluation     Row Name  12/09/21 1623 12/27/21 1400 12/31/21 0815 01/22/22 1500       Exercise Goal Re-Evaluation   Exercise Goals Review Increase Physical Activity;Increase Strength and Stamina;Able to understand and use rate of perceived exertion (RPE) scale;Knowledge and understanding of Target Heart Rate Range (THRR);Understanding of Exercise Prescription Increase Physical Activity;Increase Strength and Stamina;Able to understand and use rate of perceived exertion (RPE) scale;Knowledge and understanding of Target Heart Rate Range (THRR);Understanding of Exercise Prescription -- Increase Physical Activity;Increase Strength and Stamina;Able to understand and use rate of perceived exertion (RPE) scale;Knowledge and understanding of Target Heart Rate Range (THRR);Understanding of Exercise Prescription;Able to check pulse independently    Comments Pt's first day in the [facility] program. Pt understands the Exercise Rx, THRR, and RPE scale. Reviewed METs, goals and home exercise Rx. 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 -- Reviewed METs and goals. Pt voices improvement in strength, stamina and endurance which are patient goals. 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    Expected Outcomes Will continue to monitor patient and progress exercise workloads as tolerated. Pt will continue to walk at home 3-4 x/week in addtion to the [place] program -- Will continue to monitor and progress exercise workloads as tolerated.             Nutrition & Weight - Outcomes:  Pre Biometrics - 12/05/21 1000       Pre Biometrics   Waist Circumference 50.5 inches    Hip Circumference 47.5 inches    Waist to Hip Ratio 1.06 %    Triceps Skinfold 10 mm    % Body Fat 33.7 %    Grip Strength 44 kg    Flexibility --   not performed   Single Leg Stand 30 seconds             Post Biometrics - 01/27/22 1410        Post  Biometrics    Height 5' 9.75" (1.772 m)    Weight 117.4 kg    Waist Circumference 50.5 inches    Hip Circumference 48 inches    Waist to Hip Ratio 1.05 %    BMI (Calculated) 37.39    Triceps Skinfold 10 mm    % Body Fat 33.8 %    Grip Strength 43 kg    Flexibility --   No performed due to hip replacement   Single Leg Stand 30 seconds             Nutrition:  Nutrition Therapy & Goals - 01/10/22 1351       Nutrition Therapy   Diet Heart Healthy Diet    Drug/Food Interactions Coumadin/Vit K;Statins/Certain Fruits      Personal Nutrition Goals   Nutrition Goal Patient to implement and describe  the benefit of including lean protein/plant proteins, fruits, vegetables, whole grains, nuts/seeds, and low-fat dairy products in a heart healthy meal plan- Patient to include non-starchy vegetables at lunch and dinner daily; use the plate method as a guide for meal planning.    Personal Goal #2 Patient to identify and limit food sources of saturated fat, trans fat, refined carbohydrates and sodium-- Patient to limit red meat to 1x per week.    Personal Goal #3 Patient to continue to reduce sodium intake awareness and reduce sodium intake to '2300mg'$  per day.    Personal Goal #4 Patient will continue to reduce alcohol intake to <1-2 drinks per day.    Comments [name] continues to work on consistency of dietary changes and adopting this as lifestyle vs. short term dieting mindset. He continues to work on more mindful eating strategies. His wife remains very supportive of lifestyle changes.      Intervention Plan   Intervention Prescribe, educate and counsel regarding individualized specific dietary modifications aiming towards targeted core components such as weight, hypertension, lipid management, diabetes, heart failure and other comorbidities.;Nutrition handout(s) given to patient.    Expected Outcomes Short Term Goal: Understand basic principles of dietary content, such as calories, fat, sodium, cholesterol  and nutrients.;Short Term Goal: A plan has been developed with personal nutrition goals set during dietitian appointment.;Long Term Goal: Adherence to prescribed nutrition plan.             Nutrition Discharge:  Nutrition Assessments - 01/23/22 0754       Rate Your Plate Scores   Post Score 73             Education Questionnaire Score:  Knowledge Questionnaire Score - 01/20/22 1620       Knowledge Questionnaire Score   Post Score 22/24             Goals reviewed with patient; copy given to patient.Pt graduated from cardiac rehab program on 01/31/22  with completion of  exercise sessions in Phase II. Pt maintained good attendance and progressed nicely during  his participation in rehab as evidenced by increased MET level.   Medication list reconciled. Repeat  PHQ score-  .  Pt has made significant lifestyle changes and should be commended for his success. Pt feels he has achieved his goals during cardiac rehab.   Pt plans to continue exercise in cardiac maintenance program.

## 2022-01-31 ENCOUNTER — Encounter (HOSPITAL_COMMUNITY)
Admission: RE | Admit: 2022-01-31 | Discharge: 2022-01-31 | Disposition: A | Discharge status: Home / Self Care | Payer: No Typology Code available for payment source | Source: Ambulatory Visit | Attending: Cardiology | Admitting: Cardiology

## 2022-01-31 DIAGNOSIS — Z952 Presence of prosthetic heart valve: Secondary | ICD-10-CM

## 2022-01-31 DIAGNOSIS — Z95828 Presence of other vascular implants and grafts: Secondary | ICD-10-CM

## 2022-02-20 ENCOUNTER — Ambulatory Visit (INDEPENDENT_AMBULATORY_CARE_PROVIDER_SITE_OTHER): Payer: No Typology Code available for payment source

## 2022-02-20 DIAGNOSIS — Z952 Presence of prosthetic heart valve: Secondary | ICD-10-CM

## 2022-02-20 DIAGNOSIS — Z5181 Encounter for therapeutic drug level monitoring: Secondary | ICD-10-CM | POA: Diagnosis not present

## 2022-02-20 LAB — POCT INR: INR: 1.1 — AB (ref 2.0–3.0)

## 2022-02-20 NOTE — Patient Instructions (Signed)
Description   Take 1.5 tablets today and tomorrow, then resume same dosage of Warfarin 1 tablet daily except for 1/2 tablet on Tuesdays, Thursdays, and Saturdays. Stay consistent with your vitamin K intake. Recheck INR in 1 week.  Pt stopped taking amio 4/5   Anticoagulation Clinic [phone] Main [phone]

## 2022-02-27 ENCOUNTER — Ambulatory Visit (INDEPENDENT_AMBULATORY_CARE_PROVIDER_SITE_OTHER): Payer: No Typology Code available for payment source | Admitting: *Deleted

## 2022-02-27 DIAGNOSIS — Z5181 Encounter for therapeutic drug level monitoring: Secondary | ICD-10-CM | POA: Diagnosis not present

## 2022-02-27 DIAGNOSIS — Z952 Presence of prosthetic heart valve: Secondary | ICD-10-CM | POA: Diagnosis not present

## 2022-02-27 LAB — POCT INR: INR: 1.5 — AB (ref 2.0–3.0)

## 2022-02-27 NOTE — Patient Instructions (Signed)
Description   Today take 1 tablet then start taking Warfarin 1 tablet daily except for 1/2 tablet on Tuesdays and Saturdays. Stay consistent with your vitamin K intake. Recheck INR in 2 weeks.  Pt stopped taking amio 4/5   Anticoagulation Clinic [phone] or [phone] Main [phone]

## 2022-03-13 ENCOUNTER — Ambulatory Visit (INDEPENDENT_AMBULATORY_CARE_PROVIDER_SITE_OTHER): Payer: No Typology Code available for payment source

## 2022-03-13 DIAGNOSIS — Z952 Presence of prosthetic heart valve: Secondary | ICD-10-CM

## 2022-03-13 DIAGNOSIS — Z5181 Encounter for therapeutic drug level monitoring: Secondary | ICD-10-CM

## 2022-03-13 LAB — POCT INR: INR: 1.3 — AB (ref 2.0–3.0)

## 2022-03-13 NOTE — Patient Instructions (Signed)
start taking Warfarin 1 tablet daily except for 1/2 tablet on Tuesdays. Stay consistent with your vitamin K intake. Recheck INR in 3 weeks.    Anticoagulation Clinic [phone] or [phone] Main [phone]

## 2022-04-03 ENCOUNTER — Ambulatory Visit (INDEPENDENT_AMBULATORY_CARE_PROVIDER_SITE_OTHER): Payer: No Typology Code available for payment source

## 2022-04-03 DIAGNOSIS — Z5181 Encounter for therapeutic drug level monitoring: Secondary | ICD-10-CM | POA: Diagnosis not present

## 2022-04-03 DIAGNOSIS — Z952 Presence of prosthetic heart valve: Secondary | ICD-10-CM

## 2022-04-03 LAB — POCT INR: INR: 1.4 — AB (ref 2.0–3.0)

## 2022-04-03 NOTE — Patient Instructions (Signed)
Description   Take 1.5 tablets today and then continue taking Warfarin 1 tablet daily except for 1/2 tablet on Tuesdays.  Stay consistent with your vitamin K intake. Recheck INR in 4 weeks.  Anticoagulation Clinic [phone] or [phone] Main [phone]

## 2022-04-18 IMAGING — DX DG SHOULDER 2+V PORT*R*
1 series · 1 of 1 positions shown · non-contrast
Comparison: 11/17/2019

CLINICAL DATA: Status post right shoulder replacement

EXAM:
PORTABLE RIGHT SHOULDER

[shoulder obl]
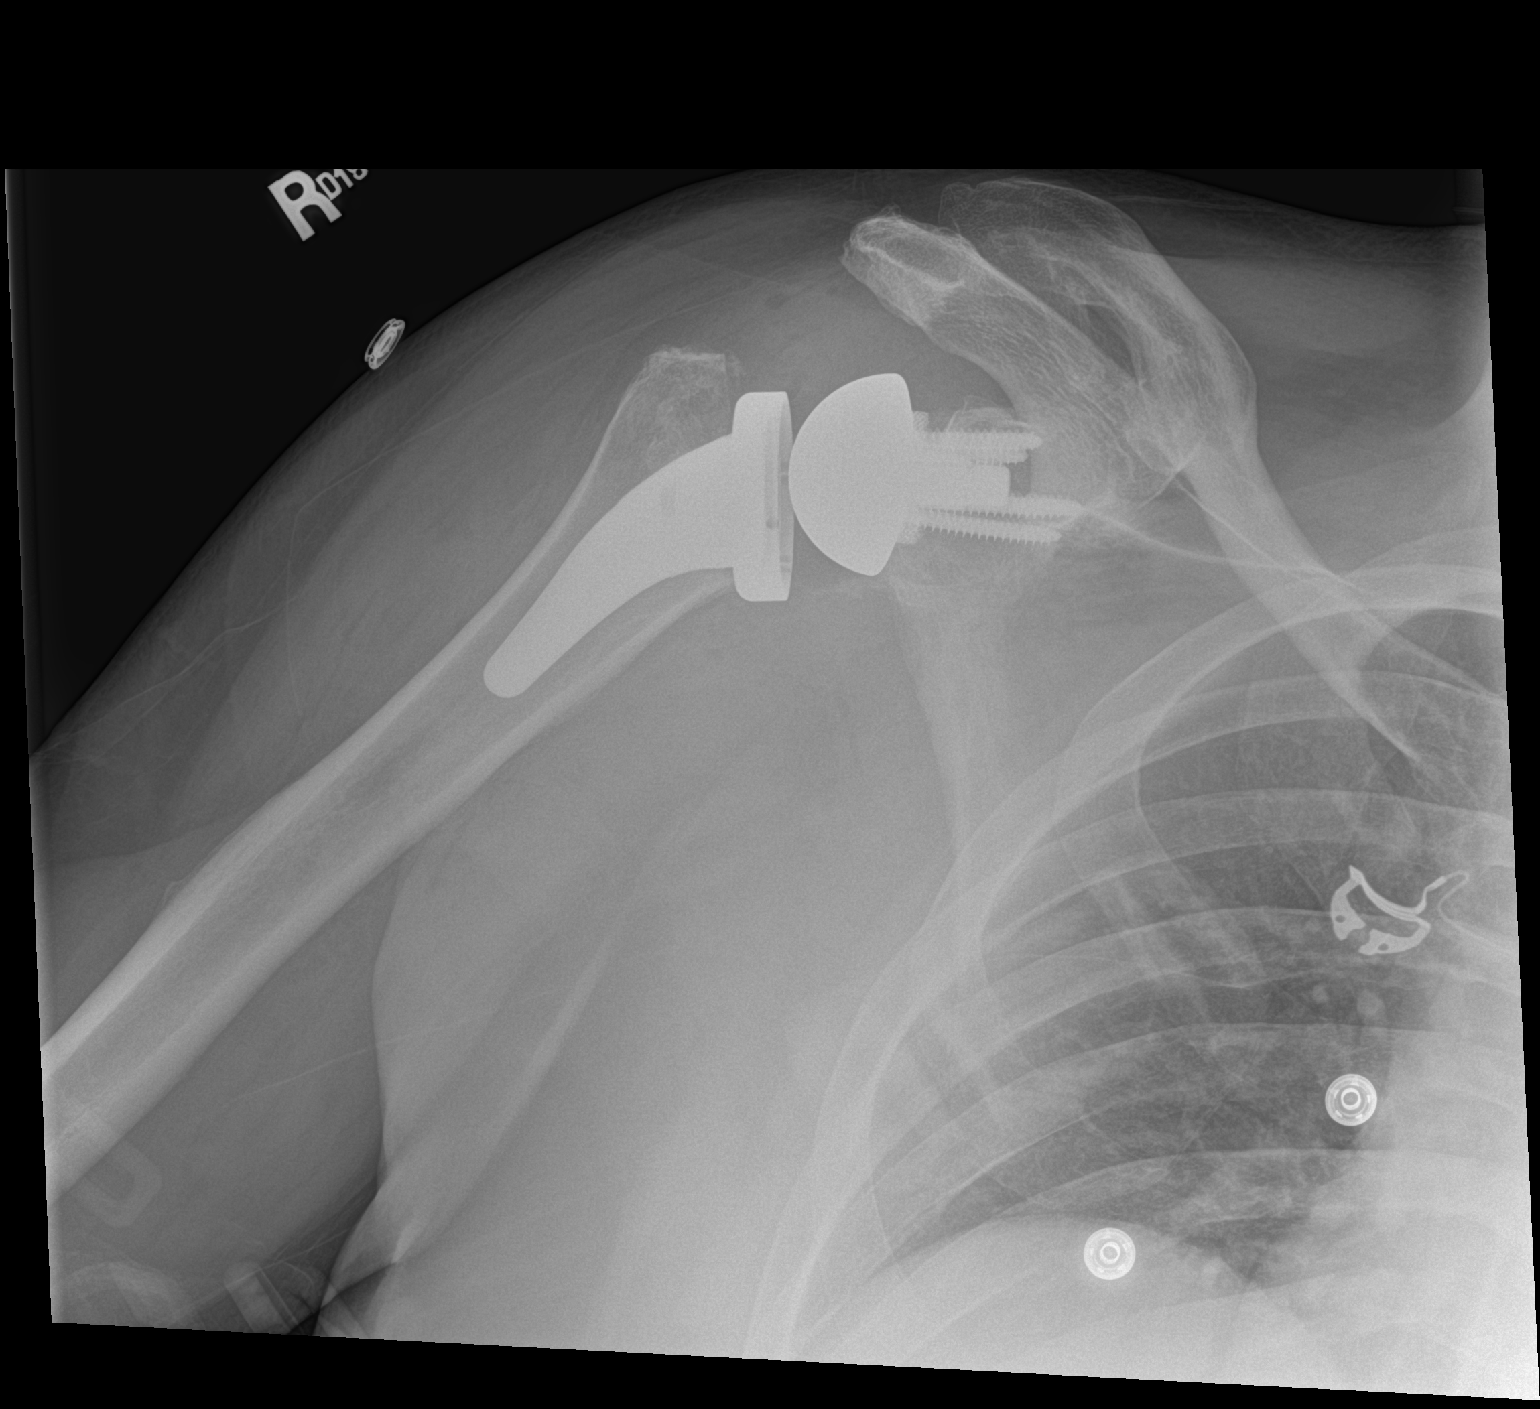

[1 of 1 positions shown; findings below may reference images not displayed]

FINDINGS: Right shoulder replacement is noted in satisfactory position. No
acute bony or soft tissue abnormality is noted.
IMPRESSION: Status post right shoulder replacement.

## 2022-04-23 ENCOUNTER — Other Ambulatory Visit: Payer: Self-pay | Admitting: Cardiology

## 2022-05-08 ENCOUNTER — Ambulatory Visit: Payer: No Typology Code available for payment source | Attending: Cardiology | Admitting: *Deleted

## 2022-05-08 DIAGNOSIS — Z952 Presence of prosthetic heart valve: Secondary | ICD-10-CM | POA: Diagnosis not present

## 2022-05-08 DIAGNOSIS — Z5181 Encounter for therapeutic drug level monitoring: Secondary | ICD-10-CM | POA: Diagnosis not present

## 2022-05-08 LAB — POCT INR: INR: 1.1 — AB (ref 2.0–3.0)

## 2022-05-08 NOTE — Patient Instructions (Signed)
Description   Take 1.5 tablets today and 1.5 tablets tomorrow then start taking Warfarin 1 tablet daily. Stay consistent with your vitamin K intake/supplements the MD placed you on recently. Recheck INR in 1 week. Anticoagulation Clinic [phone] or [phone] Main [phone]

## 2022-05-16 ENCOUNTER — Ambulatory Visit: Payer: No Typology Code available for payment source | Attending: Cardiology

## 2022-05-16 DIAGNOSIS — Z952 Presence of prosthetic heart valve: Secondary | ICD-10-CM | POA: Diagnosis not present

## 2022-05-16 DIAGNOSIS — Z5181 Encounter for therapeutic drug level monitoring: Secondary | ICD-10-CM | POA: Diagnosis not present

## 2022-05-16 LAB — POCT INR: INR: 1.1 — AB (ref 2.0–3.0)

## 2022-05-16 NOTE — Patient Instructions (Signed)
Description   Take 2 tablets today and 1.5 tablets tomorrow then START taking Warfarin 1 tablet daily EXCEPT 2 tablets on Tuesdays . Stay consistent with your vitamin K intake/supplements the MD placed you on recently. Recheck INR in 1 week. Anticoagulation Clinic [phone] or [phone] Main [phone]

## 2022-05-22 ENCOUNTER — Ambulatory Visit: Payer: No Typology Code available for payment source | Attending: Cardiology

## 2022-05-22 DIAGNOSIS — Z952 Presence of prosthetic heart valve: Secondary | ICD-10-CM

## 2022-05-22 DIAGNOSIS — Z5181 Encounter for therapeutic drug level monitoring: Secondary | ICD-10-CM | POA: Diagnosis not present

## 2022-05-22 LAB — POCT INR: INR: 1.2 — AB (ref 2.0–3.0)

## 2022-05-22 NOTE — Patient Instructions (Signed)
Description   START taking Warfarin 1 tablet daily EXCEPT 2 tablets on Sundays, Tuesdays, Thursdays. Stay consistent with your vitamin K intake/supplements the MD placed you on recently.  Recheck INR in 1 week.  Anticoagulation Clinic: [phone]

## 2022-05-29 ENCOUNTER — Ambulatory Visit: Payer: No Typology Code available for payment source

## 2022-06-03 ENCOUNTER — Ambulatory Visit: Payer: No Typology Code available for payment source | Attending: Cardiology

## 2022-06-03 DIAGNOSIS — Z952 Presence of prosthetic heart valve: Secondary | ICD-10-CM

## 2022-06-03 DIAGNOSIS — Z5181 Encounter for therapeutic drug level monitoring: Secondary | ICD-10-CM

## 2022-06-03 LAB — POCT INR: INR: 1.2 — AB (ref 2.0–3.0)

## 2022-06-03 NOTE — Patient Instructions (Signed)
TAKE 2.5 TABLETS TODAY ONLY THEN CONTINUE 1 tablet daily EXCEPT 2 tablets on Sundays, Tuesdays, Thursdays. Stay consistent with your vitamin K intake/supplements the MD placed you on recently.  Recheck INR in 1 week.  Anticoagulation Clinic: [phone]

## 2022-06-10 ENCOUNTER — Ambulatory Visit: Payer: No Typology Code available for payment source | Attending: Cardiology | Admitting: *Deleted

## 2022-06-10 DIAGNOSIS — Z5181 Encounter for therapeutic drug level monitoring: Secondary | ICD-10-CM | POA: Diagnosis not present

## 2022-06-10 DIAGNOSIS — Z952 Presence of prosthetic heart valve: Secondary | ICD-10-CM | POA: Diagnosis not present

## 2022-06-10 LAB — POCT INR: INR: 1.2 — AB (ref 2.0–3.0)

## 2022-06-10 NOTE — Patient Instructions (Signed)
Description   TAKE 3 TABLETS TODAY then START taking 2 tablets daily EXCEPT 1 tablet on Mondays and Fridays.  Stay consistent with your vitamin K intake/supplements the MD placed you on recently. Recheck INR in 1 week. Anticoagulation Clinic: [phone]

## 2022-06-17 ENCOUNTER — Ambulatory Visit: Payer: No Typology Code available for payment source | Attending: Cardiology | Admitting: *Deleted

## 2022-06-17 DIAGNOSIS — Z952 Presence of prosthetic heart valve: Secondary | ICD-10-CM

## 2022-06-17 DIAGNOSIS — Z5181 Encounter for therapeutic drug level monitoring: Secondary | ICD-10-CM

## 2022-06-17 LAB — POCT INR: INR: 1.4 — AB (ref 2.0–3.0)

## 2022-06-17 NOTE — Patient Instructions (Addendum)
Description   TAKE 3 TABLETS TODAY then START taking 2 tablets (10mg ) daily. Stay consistent with your vitamin K intake/supplements the MD placed you on recently. Recheck INR in 1 week. Anticoagulation Clinic: [phone]

## 2022-06-24 ENCOUNTER — Ambulatory Visit: Payer: No Typology Code available for payment source | Attending: Cardiology | Admitting: *Deleted

## 2022-06-24 DIAGNOSIS — Z5181 Encounter for therapeutic drug level monitoring: Secondary | ICD-10-CM | POA: Diagnosis not present

## 2022-06-24 DIAGNOSIS — Z952 Presence of prosthetic heart valve: Secondary | ICD-10-CM | POA: Diagnosis not present

## 2022-06-24 LAB — POCT INR: INR: 1.6 — AB (ref 2.0–3.0)

## 2022-06-24 NOTE — Patient Instructions (Signed)
Description   Continue taking 2 tablets (10mg ) daily. Stay consistent with your vitamin K intake/supplements the MD placed you on recently. Recheck INR in 2 weeks. Anticoagulation Clinic: [phone]

## 2022-06-27 ENCOUNTER — Other Ambulatory Visit: Payer: Self-pay | Admitting: Cardiology

## 2022-06-27 DIAGNOSIS — I480 Paroxysmal atrial fibrillation: Secondary | ICD-10-CM

## 2022-06-27 MED ORDER — WARFARIN SODIUM 5 MG PO TABS: ORAL_TABLET | ORAL | 1 refills | Status: DC

## 2022-06-27 NOTE — Telephone Encounter (Signed)
*  STAT* If patient is at the pharmacy, call can be transferred to refill team.   1. Which medications need to be refilled? (please list name of each medication and dose if known) warfarin (COUMADIN) 5 MG tablet  2. Which pharmacy/location (including street and city if local pharmacy) is medication to be sent to? [id number] - [place], [place] - [address]  3. Do they need a 30 day or 90 day supply? [place]

## 2022-06-27 NOTE — Telephone Encounter (Signed)
Last INR 06/24/22 Last OV 01/09/2022

## 2022-07-09 ENCOUNTER — Ambulatory Visit: Payer: No Typology Code available for payment source | Attending: Cardiology

## 2022-07-09 DIAGNOSIS — Z5181 Encounter for therapeutic drug level monitoring: Secondary | ICD-10-CM

## 2022-07-09 DIAGNOSIS — Z952 Presence of prosthetic heart valve: Secondary | ICD-10-CM | POA: Diagnosis not present

## 2022-07-09 LAB — POCT INR: INR: 2.6 (ref 2.0–3.0)

## 2022-07-09 NOTE — Patient Instructions (Signed)
Description   Only take 1 tablet today and then continue taking 2 tablets (10mg ) daily. Stay consistent with your vitamin K intake/supplements the MD placed you on recently.  Recheck INR in 2 weeks. Anticoagulation Clinic: [phone]

## 2022-07-14 ENCOUNTER — Encounter: Payer: Self-pay | Admitting: Cardiology

## 2022-07-14 ENCOUNTER — Ambulatory Visit: Payer: No Typology Code available for payment source | Attending: Cardiology | Admitting: Cardiology

## 2022-07-14 ENCOUNTER — Ambulatory Visit: Payer: No Typology Code available for payment source | Attending: Cardiology

## 2022-07-14 VITALS — BP 140/80 | HR 70 | Ht 69.75 in | Wt 261.0 lb

## 2022-07-14 DIAGNOSIS — I480 Paroxysmal atrial fibrillation: Secondary | ICD-10-CM | POA: Diagnosis not present

## 2022-07-14 DIAGNOSIS — I2583 Coronary atherosclerosis due to lipid rich plaque: Secondary | ICD-10-CM

## 2022-07-14 DIAGNOSIS — I2510 Atherosclerotic heart disease of native coronary artery without angina pectoris: Secondary | ICD-10-CM

## 2022-07-14 DIAGNOSIS — Z5181 Encounter for therapeutic drug level monitoring: Secondary | ICD-10-CM | POA: Diagnosis not present

## 2022-07-14 DIAGNOSIS — R002 Palpitations: Secondary | ICD-10-CM

## 2022-07-14 DIAGNOSIS — I10 Essential (primary) hypertension: Secondary | ICD-10-CM | POA: Diagnosis not present

## 2022-07-14 NOTE — Progress Notes (Signed)
Cardiology Office Note:    Date:  07/14/2022   ID:  Gabriel DustJeffrey T Robotham, DOB 1971-07-04, MRN [id number]  PCP:  Lavada MesiHilts, Michael, MD   [facility] Providers Cardiologist:  Donato SchultzMark Tomorrow Dehaas, MD     Referring MD: Lavada MesiHilts, Michael, MD    History of Present Illness:    Gabriel Anderson is a 51 y.o. male here for follow-up visit aortic valve replacement. AVR using a 23 mm On-X mechanical valve and supra-coronary replacement of an ascending aortic aneurysm on 09/16/2021. His INR has been followed in our Coumadin clinic.  Has been therapeutic.  PCI to the LAD in 2013  Metoprolol - ED side effect. 50 bid. Cardiac rehab. Cut back. Off amio now. PAF post op.   Kawasaki dz in the past. Hands and feet peeled. Age 51.   [employer].  Plays center.  Today,he is accompanied with his wife, Alcario Droughtrica.  He was experiencing symptoms that is similar to lightheadedness and dizziness while driving. The symptoms became so severe that he had to stop and take a break from driving. He checked his watch, which was 118 bpm. He was also experiencing subtle shakiness on his hands as well at the time with slight paleness. He experienced dizziness in today's visit.  He also endorsed a time where he was experiencing a "pounding" sensation on his chest and tachycardia, which radiated towards his neck. Occasionally, he experiences vision changes whenever he has these episodes, which he endorses seeing "spots" or a "crescent".   He is currently on Coumadin.His last INR check was 2.6.  There was another time where he was at the grocery store with his wife and was experiencing the same episode. At the time, he reported feeling overstimulated by everything occurring at the grocery store.   He is expecting to receive a hip replacement soon.  He denies any palpitations, chest pain, shortness of breath, or peripheral edema. No headaches, syncope, orthopnea, or PND.  Past Medical History:  Diagnosis Date    Acute myocardial infarction, subendocardial infarction, initial episode of care [facility](HCC) 2013   Cardiac catheterization 06/02/12-widely patent previously placed LAD stents. Troponin peaked 3. Normal EF.   Anemia    Arthritis    Ascending aortic aneurysm (HCC) 08/26/2021   4.4 cm on 05/16/21 CT   Coronary atherosclerosis of native coronary artery 05/21/2012   Cardiac catheterization 05/21/12-95% mid LAD lesion at the bifurcation of a large diagonal branch. Normal ejection fraction.   Dyspnea    Heart murmur    aortic stenosis   Hyperlipidemia    Hypertension    Pneumonia    Raynaud's disease    Vasospasm (HCC)     Past Surgical History:  Procedure Laterality Date    cardiacstent x 2  2013   AORTIC VALVE REPLACEMENT N/A 09/16/2021   Procedure: AORTIC VALVE REPLACEMENT (AVR) USING ON-X 23MM PROSTHETIC VALVE;  Surgeon: Alleen BorneBartle, Bryan K, MD;  Location: [facility] OR;  Service: Open Heart Surgery;  Laterality: N/A;  Circ Arrest   CARDIAC CATHETERIZATION  05/2012   PCI LAD at takeoff of large diagonal   ELBOW SURGERY Left 1984   LEFT HEART CATHETERIZATION WITH CORONARY ANGIOGRAM N/A 05/21/2012   Procedure: LEFT HEART CATHETERIZATION WITH CORONARY ANGIOGRAM;  Surgeon: Corky CraftsJayadeep S Varanasi, MD;  [facility];  Service: Cardiovascular;  Laterality: N/A;   LEFT HEART CATHETERIZATION WITH CORONARY ANGIOGRAM N/A 06/02/2012   Procedure: LEFT HEART CATHETERIZATION WITH CORONARY ANGIOGRAM;  Surgeon: Donato SchultzMark Zetta Stoneman, MD;  [facility];  Service: Cardiovascular;  Laterality: N/A;   PERCUTANEOUS CORONARY STENT INTERVENTION (PCI-S)  05/21/2012   Procedure: PERCUTANEOUS CORONARY STENT INTERVENTION (PCI-S);  Surgeon: Corky Crafts, MD;  Location: [facility] CATH LAB;  Service: Cardiovascular;;   REPLACEMENT ASCENDING AORTA N/A 09/16/2021   Procedure: REPLACEMENT OF ASCENDING AORTA USING HEMASHIELD PLATINUM X10MM Z36UY GRAFT;  Surgeon: Alleen Borne, MD;  Location: [facility] OR;  Service: Open Heart Surgery;   Laterality: N/A;   REVERSE SHOULDER ARTHROPLASTY Right 05/23/2020   Procedure: REVERSE SHOULDER ARTHROPLASTY;  Surgeon: Bjorn Pippin, MD;  Location: [facility] ORS;  Service: Orthopedics;  Laterality: Right;   RIGHT/LEFT HEART CATH AND CORONARY ANGIOGRAPHY N/A 12/19/2020   Procedure: RIGHT/LEFT HEART CATH AND CORONARY ANGIOGRAPHY;  Surgeon: Marykay Lex, MD;  Location: [facility] INVASIVE CV LAB;  Service: Cardiovascular;  Laterality: N/A;   ROTATOR CUFF REPAIR Right 2009   TEE WITHOUT CARDIOVERSION N/A 09/16/2021   Procedure: TRANSESOPHAGEAL ECHOCARDIOGRAM (TEE);  Surgeon: Alleen Borne, MD;  Location: [facility] OR;  Service: Open Heart Surgery;  Laterality: N/A;   TOTAL HIP ARTHROPLASTY Left 12/30/2013   Procedure: LEFT TOTAL HIP ARTHROPLASTY ANTERIOR APPROACH;  Surgeon: Kathryne Hitch, MD;  Location: [facility] ORS;  Service: Orthopedics;  Laterality: Left;    Current Medications: Current Meds  Medication Sig   acetaminophen (TYLENOL) 500 MG tablet Take 1,000 mg by mouth every 8 (eight) hours as needed for moderate pain.   amoxicillin (AMOXIL) 500 MG tablet Take 2,000 mg by mouth See admin instructions. Take 2000 mg by mouth one hour prior to dental procedures   aspirin 81 MG tablet Take 1 tablet (81 mg total) by mouth daily.   Cholecalciferol (D3 SUPER STRENGTH PO) Take 125 mcg by mouth daily.   nitroGLYCERIN (NITROSTAT) 0.4 MG SL tablet Place 1 tablet (0.4 mg total) under the tongue every 5 (five) minutes as needed. Max of 3 doses, then 911   warfarin (COUMADIN) 5 MG tablet Take 2 tablets by mouth daily or as instructed by the Anticoagulation Clinic.     Allergies:   Patient has no known allergies.   Social History   Socioeconomic History   Marital status: Married    Spouse name: [name]   Number of children: 2   Years of education: 12   Highest education level: Not on file  Occupational History   Not on file  Tobacco Use   Smoking status: Never   Smokeless tobacco: Never  Vaping Use   Vaping  Use: Never used  Substance and Sexual Activity   Alcohol use: Yes    Alcohol/week: 11.0 standard drinks of alcohol    Types: 5 Cans of beer, 2 Shots of liquor, 4 Standard drinks or equivalent per week    Comment: moderate - cutting back from 28 beers   Drug use: Yes    Frequency: 7.0 times per week    Types: Marijuana    Comment: marijuana 0.05 grams smoked  per day for joint pain   Sexual activity: Yes    Partners: Female  Other Topics Concern   Not on file  Social History Narrative   Not on file   Social Determinants of Health   Financial Resource Strain: Not on file  Food Insecurity: Not on file  Transportation Needs: Not on file  Physical Activity: Not on file  Stress: Not on file  Social Connections: Not on file     Family History: The patient's family history includes COPD in his mother; Diabetes in his  father; Heart disease in his father; Heart failure in his father; Hypertension in his father; Stroke in his sister.  ROS:   Please see the history of present illness.    (+) Lightheadedness (+) Dizziness No fevers chills nausea vomiting syncope bleeding all other systems reviewed and are negative.  EKGs/Labs/Other Studies Reviewed:    The following studies were reviewed today:  Echo 11/04/2021: IMPRESSIONS    1. S/P AVR with mean gradient 7 mmHg, DI 0.63 and mild AI.   2. Left ventricular ejection fraction, by estimation, is 50 to 55%. The  left ventricle has low normal function. The left ventricle has no regional  wall motion abnormalities. There is mild left ventricular hypertrophy.  Left ventricular diastolic  parameters were normal. Elevated left atrial pressure.   3. Right ventricular systolic function is normal. The right ventricular  size is normal. Tricuspid regurgitation signal is inadequate for assessing  PA pressure.   4. The mitral valve is normal in structure. Mild mitral valve  regurgitation. No evidence of mitral stenosis.   5. The aortic valve  has been repaired/replaced. Aortic valve  regurgitation is mild. No aortic stenosis is present. There is a 23 mm  ON-X mechanical valve valve present in the aortic position. Procedure  Date: 09/16/21.   6. The inferior vena cava is normal in size with greater than 50%  respiratory variability, suggesting right atrial pressure of 3 mmHg.   FINDINGS   Left Ventricle: Left ventricular ejection fraction, by estimation, is 50  to 55%. The left ventricle has low normal function. The left ventricle has  no regional wall motion abnormalities. The left ventricular internal  cavity size was normal in size.  There is mild left ventricular hypertrophy. Left ventricular diastolic  parameters were normal. Elevated left atrial pressure.   Right Ventricle: The right ventricular size is normal. Right ventricular  systolic function is normal. Tricuspid regurgitation signal is inadequate  for assessing PA pressure. The tricuspid regurgitant velocity is 2.10 m/s,  and with an assumed right atrial   pressure of 3 mmHg, the estimated right ventricular systolic pressure is  20.6 mmHg.   Left Atrium: Left atrial size was normal in size.   Right Atrium: Right atrial size was normal in size.   Pericardium: There is no evidence of pericardial effusion.   Mitral Valve: The mitral valve is normal in structure. Mild mitral valve  regurgitation. No evidence of mitral valve stenosis.   Tricuspid Valve: The tricuspid valve is normal in structure. Tricuspid  valve regurgitation is trivial. No evidence of tricuspid stenosis.   Aortic Valve: The aortic valve has been repaired/replaced. Aortic valve  regurgitation is mild. Aortic regurgitation PHT measures 381 msec. No  aortic stenosis is present. Aortic valve mean gradient measures 7.5 mmHg.  Aortic valve peak gradient measures  13.5 mmHg. Aortic valve area, by VTI measures 3.90 cm. There is a 23 mm  ON-X mechanical valve valve present in the aortic position.  Procedure  Date: 09/16/21.   Pulmonic Valve: The pulmonic valve was normal in structure. Pulmonic valve  regurgitation is not visualized. No evidence of pulmonic stenosis.   Aorta: The aortic root is normal in size and structure.   Venous: The inferior vena cava is normal in size with greater than 50%  respiratory variability, suggesting right atrial pressure of 3 mmHg.   IAS/Shunts: No atrial level shunt detected by color flow Doppler.   Additional Comments: S/P AVR with mean gradient 7 mmHg, DI 0.63 and mild  AI.   DG Chest 10/17/2021: FINDINGS: The heart has decreased to normal size since the prior study. Intact median sternotomy sutures are again noted. There is an aortic valve prosthesis and old LAD coronary artery stenting.   No vascular congestion is evident. No pleural effusion is seen. Stable mediastinum. The lungs are clear. A reverse right shoulder arthroplasty is again shown.   IMPRESSION: No active cardiopulmonary disease.  Aortic valve replacement.  EKG: EKG is personally reviewed. 07/14/2022: Normal sinus rhythm 60 with incomplete right bundle branch block, normal PR interval 188 ms (and stopping Toprol).  Normal QT interval as well.  10/03/2021: Sinus rhythm with 1st degree AV block, 72 bpm  Recent Labs: 09/12/2021: ALT 37 09/17/2021: Magnesium 2.3 09/19/2021: TSH 1.317 10/03/2021: BUN 15; Creatinine, Ser 1.01; Hemoglobin 12.1; Platelets 768; Potassium 5.4; Sodium 140  Recent Lipid Panel    Component Value Date/Time   CHOL 109 03/27/2020 1111   CHOL 151 10/26/2018 0959   TRIG 76 03/27/2020 1111   HDL 42 03/27/2020 1111   HDL 44 10/26/2018 0959   CHOLHDL 2.6 03/27/2020 1111   VLDL 16.0 02/12/2015 0909   LDLCALC 51 03/27/2020 1111   LDLDIRECT 71 10/03/2021 1507   LDLDIRECT 150.0 09/19/2013 1050     Risk Assessment/Calculations:              Physical Exam:    VS:  BP (!) 140/80 (BP Location: Left Arm, Patient Position: Sitting, Cuff Size: Normal)    Pulse 70   Ht 5' 9.75" (1.772 m)   Wt 261 lb (118.4 kg)   SpO2 98%   BMI 37.72 kg/m     Wt Readings from Last 3 Encounters:  07/14/22 261 lb (118.4 kg)  01/27/22 258 lb 13.1 oz (117.4 kg)  01/09/22 256 lb 9.6 oz (116.4 kg)     GEN:  Well nourished, well developed in no acute distress HEENT: Normal NECK: No JVD; No carotid bruits LYMPHATICS: No lymphadenopathy CARDIAC: Sharp S2 click RRR, no murmurs, no rubs, gallops RESPIRATORY:  Clear to auscultation without rales, wheezing or rhonchi  ABDOMEN: Soft, non-tender, non-distended MUSCULOSKELETAL:  No edema; No deformity  SKIN: Warm and dry NEUROLOGIC:  Alert and oriented x 3 PSYCHIATRIC:  Normal affect   ASSESSMENT:    1. Coronary artery disease due to lipid rich plaque   2. Essential hypertension   3. Encounter for therapeutic drug monitoring   4. Paroxysmal atrial fibrillation (HCC)   5. Palpitations     PLAN:    In order of problems listed above:  Dizziness sensation/presyncope - Palpitations also fell.  Heart rate was 118 on Apple Watch during sensation of feeling somewhat out of body, felt pale, slightly diaphoretic.  Could have been vasovagal type episode.  Nonetheless, we will go ahead and check blood work today to ensure that everything is normal.  I will also check a Zio patch monitor to ensure that he is not having any adverse arrhythmias.  He did have postop atrial fibrillation.  No longer on amiodarone.  He is off of the Toprol.  Aortic valve replacement - Doing well with this.  Sharp click.  Last echocardiogram in February 2023 showed normal function.  I do not hear any significant murmurs on exam.  His INR is being closely followed at our [facility] Coumadin clinic location.  He does take some supplements, green leafy vegetables but he has been consistent with this.  Preop hip pain - Seeing Dr. Magnus Ivan.  He would be able to  come off of Coumadin for 7 days per guidelines with his mechanical aortic valve.  If  longer duration is felt to be necessary, may need to utilize Lovenox bridge.  We will decide when it is closer to surgery.  I will discuss with my pharmacy team.  Coronary artery disease - Prior LAD stent.  Stable.   Continue to encourage weight loss.     Follow-up: 21-months Medication Adjustments/Labs and Tests Ordered: Current medicines are reviewed at length with the patient today.  Concerns regarding medicines are outlined above.  Orders Placed This Encounter  Procedures   Comprehensive metabolic panel   CBC   Lipid panel   TSH   LONG TERM MONITOR (3-14 DAYS)   EKG 12-Lead   No orders of the defined types were placed in this encounter.   Patient Instructions  Medication Instructions:  The current medical regimen is effective;  continue present plan and medications.  *If you need a refill on your cardiac medications before your next appointment, please call your pharmacy*  Lab Work: Please have blood work today (CBC, CMP, Lipid and TSH)  If you have labs (blood work) drawn today and your tests are completely normal, you will receive your results only by: MyChart Message (if you have MyChart) OR A paper copy in the mail If you have any lab test that is abnormal or we need to change your treatment, we will call you to review the results.  Testing/Procedures: [place]n [place]- Long Term Monitor Instructions  Your physician has requested you wear a ZIO patch monitor for 14 days.  This is a single patch monitor. [employer] supplies one patch monitor per enrollment. Additional stickers are not available. Please do not apply patch if you will be having a Nuclear Stress Test,  Echocardiogram, Cardiac CT, MRI, or Chest Xray during the period you would be wearing the  monitor. The patch cannot be worn during these tests. You cannot remove and re-apply the  ZIO XT patch monitor.  Your ZIO patch monitor will be mailed 3 day USPS to your address on file. It may take 3-5 days  to receive your  monitor after you have been enrolled.  Once you have received your monitor, please review the enclosed instructions. Your monitor  has already been registered assigning a specific monitor serial # to you.  Billing and Patient Assistance Program Information  We have supplied [employer] with any of your insurance information on file for billing purposes. [employer] offers a sliding scale Patient Assistance Program for patients that do not have  insurance, or whose insurance does not completely cover the cost of the ZIO monitor.  You must apply for the Patient Assistance Program to qualify for this discounted rate.  To apply, please call [employer] at [phone], select option 4, select option 2, ask to apply for  Patient Assistance Program. Theodore Demark will ask your household income, and how many people  are in your household. They will quote your out-of-pocket cost based on that information.  [employer] will also be able to set up a 58-month, interest-free payment plan if needed.  Applying the monitor   Shave hair from upper left chest.  Hold abrader disc by orange tab. Rub abrader in 40 strokes over the upper left chest as  indicated in your monitor instructions.  Clean area with 4 enclosed alcohol pads. Let dry.  Apply patch as indicated in monitor instructions. Patch will be placed under collarbone on left  side of chest with arrow pointing upward.  Rub patch adhesive wings for 2 minutes. Remove white label marked "1". Remove the white  label marked "2". Rub patch adhesive wings for 2 additional minutes.  While looking in a mirror, press and release button in center of patch. A small green light will  flash 3-4 times. This will be your only indicator that the monitor has been turned on.  Do not shower for the first 24 hours. You may shower after the first 24 hours.  Press the button if you feel a symptom. You will hear a small click. Record Date, Time and  Symptom in the Patient Logbook.  When you  are ready to remove the patch, follow instructions on the last 2 pages of Patient  Logbook. Stick patch monitor onto the last page of Patient Logbook.  Place Patient Logbook in the blue and white box. Use locking tab on box and tape box closed  securely. The blue and white box has prepaid postage on it. Please place it in the mailbox as  soon as possible. Your physician should have your test results approximately 7 days after the  monitor has been mailed back to [facility].  Call [facility] Customer Care at [phone] if you have questions regarding  your ZIO XT patch monitor. Call them immediately if you see an orange light blinking on your  monitor.  If your monitor falls off in less than 4 days, contact our Monitor department at [phone].  If your monitor becomes loose or falls off after 4 days call [employer] at [phone] for  suggestions on securing your monitor  Follow-Up: At [facility], you and your health needs are our priority.  As part of our continuing mission to provide you with exceptional heart care, we have created designated Provider Care Teams.  These Care Teams include your primary Cardiologist (physician) and Advanced Practice Providers (APPs -  Physician Assistants and Nurse Practitioners) who all work together to provide you with the care you need, when you need it.  We recommend signing up for the patient portal called "MyChart".  Sign up information is provided on this After Visit Summary.  MyChart is used to connect with patients for Virtual Visits (Telemedicine).  Patients are able to view lab/test results, encounter notes, upcoming appointments, etc.  Non-urgent messages can be sent to your provider as well.   To learn more about what you can do with MyChart, go to [email or web address].    Your next appointment:   6 month(s)  The format for your next appointment:   In Person  Provider:   Donato Schultz, MD      Important Information  About Sugar          Jeralyn Ruths as a scribe for Donato Schultz, MD.,have documented all relevant documentation on the behalf of Donato Schultz, MD,as directed by  Donato Schultz, MD while in the presence of Donato Schultz, MD.  I, Donato Schultz, MD, have reviewed all documentation for this visit. The documentation on 07/14/22 for the exam, diagnosis, procedures, and orders are all accurate and complete.    Signed, Donato Schultz, MD  07/14/2022 11:40 AM    [facility]

## 2022-07-14 NOTE — Patient Instructions (Signed)
Medication Instructions:  The current medical regimen is effective;  continue present plan and medications.  *If you need a refill on your cardiac medications before your next appointment, please call your pharmacy*  Lab Work: Please have blood work today (CBC, CMP, Lipid and TSH)  If you have labs (blood work) drawn today and your tests are completely normal, you will receive your results only by: MyChart Message (if you have MyChart) OR A paper copy in the mail If you have any lab test that is abnormal or we need to change your treatment, we will call you to review the results.  Testing/Procedures: [place] [place]- Long Term Monitor Instructions  Your physician has requested you wear a ZIO patch monitor for 14 days.  This is a single patch monitor. [employer] supplies one patch monitor per enrollment. Additional stickers are not available. Please do not apply patch if you will be having a Nuclear Stress Test,  Echocardiogram, Cardiac CT, MRI, or Chest Xray during the period you would be wearing the  monitor. The patch cannot be worn during these tests. You cannot remove and re-apply the  ZIO XT patch monitor.  Your ZIO patch monitor will be mailed 3 day USPS to your address on file. It may take 3-5 days  to receive your monitor after you have been enrolled.  Once you have received your monitor, please review the enclosed instructions. Your monitor  has already been registered assigning a specific monitor serial # to you.  Billing and Patient Assistance Program Information  We have supplied [employer] with any of your insurance information on file for billing purposes. [employer] offers a sliding scale Patient Assistance Program for patients that do not have  insurance, or whose insurance does not completely cover the cost of the ZIO monitor.  You must apply for the Patient Assistance Program to qualify for this discounted rate.  To apply, please call [employer] at [phone], select option 4, select  option 2, ask to apply for  Patient Assistance Program. Theodore Demark will ask your household income, and how many people  are in your household. They will quote your out-of-pocket cost based on that information.  [employer] will also be able to set up a 20-month, interest-free payment plan if needed.  Applying the monitor   Shave hair from upper left chest.  Hold abrader disc by orange tab. Rub abrader in 40 strokes over the upper left chest as  indicated in your monitor instructions.  Clean area with 4 enclosed alcohol pads. Let dry.  Apply patch as indicated in monitor instructions. Patch will be placed under collarbone on left  side of chest with arrow pointing upward.  Rub patch adhesive wings for 2 minutes. Remove white label marked "1". Remove the white  label marked "2". Rub patch adhesive wings for 2 additional minutes.  While looking in a mirror, press and release button in center of patch. A small green light will  flash 3-4 times. This will be your only indicator that the monitor has been turned on.  Do not shower for the first 24 hours. You may shower after the first 24 hours.  Press the button if you feel a symptom. You will hear a small click. Record Date, Time and  Symptom in the Patient Logbook.  When you are ready to remove the patch, follow instructions on the last 2 pages of Patient  Logbook. Stick patch monitor onto the last page of Patient Logbook.  Place Patient Logbook in the blue and  white box. Use locking tab on box and tape box closed  securely. The blue and white box has prepaid postage on it. Please place it in the mailbox as  soon as possible. Your physician should have your test results approximately 7 days after the  monitor has been mailed back to [facility].  Call [facility] Customer Care at [phone] if you have questions regarding  your ZIO XT patch monitor. Call them immediately if you see an orange light blinking on your  monitor.  If your monitor  falls off in less than 4 days, contact our Monitor department at [phone].  If your monitor becomes loose or falls off after 4 days call [employer] at [phone] for  suggestions on securing your monitor  Follow-Up: At [facility], you and your health needs are our priority.  As part of our continuing mission to provide you with exceptional heart care, we have created designated Provider Care Teams.  These Care Teams include your primary Cardiologist (physician) and Advanced Practice Providers (APPs -  Physician Assistants and Nurse Practitioners) who all work together to provide you with the care you need, when you need it.  We recommend signing up for the patient portal called "MyChart".  Sign up information is provided on this After Visit Summary.  MyChart is used to connect with patients for Virtual Visits (Telemedicine).  Patients are able to view lab/test results, encounter notes, upcoming appointments, etc.  Non-urgent messages can be sent to your provider as well.   To learn more about what you can do with MyChart, go to [email or web address].    Your next appointment:   6 month(s)  The format for your next appointment:   In Person  Provider:   Donato Schultz, MD      Important Information About Sugar

## 2022-07-14 NOTE — Progress Notes (Unsigned)
Enrolled for [employer] to mail a ZIO XT long term holter monitor to the patients address on file.  

## 2022-07-15 LAB — COMPREHENSIVE METABOLIC PANEL
ALT: 32 IU/L (ref 0–44)
AST: 26 IU/L (ref 0–40)
Albumin/Globulin Ratio: 2.3 — ABNORMAL HIGH (ref 1.2–2.2)
Albumin: 4.5 g/dL (ref 3.8–4.9)
Alkaline Phosphatase: 92 IU/L (ref 44–121)
BUN/Creatinine Ratio: 12 (ref 9–20)
BUN: 11 mg/dL (ref 6–24)
Bilirubin Total: 0.4 mg/dL (ref 0.0–1.2)
CO2: 27 mmol/L (ref 20–29)
Calcium: 9.4 mg/dL (ref 8.7–10.2)
Chloride: 100 mmol/L (ref 96–106)
Creatinine, Ser: 0.93 mg/dL (ref 0.76–1.27)
Globulin, Total: 2 g/dL (ref 1.5–4.5)
Glucose: 100 mg/dL — ABNORMAL HIGH (ref 70–99)
Potassium: 4.2 mmol/L (ref 3.5–5.2)
Sodium: 137 mmol/L (ref 134–144)
Total Protein: 6.5 g/dL (ref 6.0–8.5)
eGFR: 98 mL/min/{1.73_m2} (ref >59)

## 2022-07-15 LAB — LIPID PANEL
Chol/HDL Ratio: 6.1 ratio — ABNORMAL HIGH (ref 0.0–5.0)
Cholesterol, Total: 227 mg/dL — ABNORMAL HIGH (ref 100–199)
HDL: 37 mg/dL — ABNORMAL LOW (ref >39)
LDL Chol Calc (NIH): 152 mg/dL — ABNORMAL HIGH (ref 0–99)
Triglycerides: 208 mg/dL — ABNORMAL HIGH (ref 0–149)
VLDL Cholesterol Cal: 38 mg/dL (ref 5–40)

## 2022-07-15 LAB — CBC
Hematocrit: 44.1 % (ref 37.5–51.0)
Hemoglobin: 14.9 g/dL (ref 13.0–17.7)
MCH: 29.3 pg (ref 26.6–33.0)
MCHC: 33.8 g/dL (ref 31.5–35.7)
MCV: 87 fL (ref 79–97)
Platelets: 345 10*3/uL (ref 150–450)
RBC: 5.09 x10E6/uL (ref 4.14–5.80)
RDW: 12.4 % (ref 11.6–15.4)
WBC: 8.8 10*3/uL (ref 3.4–10.8)

## 2022-07-15 LAB — TSH: TSH: 2.72 u[IU]/mL (ref 0.450–4.500)

## 2022-07-17 ENCOUNTER — Telehealth: Payer: Self-pay | Admitting: *Deleted

## 2022-07-17 DIAGNOSIS — E785 Hyperlipidemia, unspecified: Secondary | ICD-10-CM

## 2022-07-17 DIAGNOSIS — R002 Palpitations: Secondary | ICD-10-CM | POA: Diagnosis not present

## 2022-07-17 DIAGNOSIS — Z5181 Encounter for therapeutic drug level monitoring: Secondary | ICD-10-CM

## 2022-07-17 NOTE — Telephone Encounter (Signed)
Sharin Grave, RN 07/17/2022  2:30 PM EST Back to Top    Reviewed results with pt who states understanding.  He will start crestor 20 mg daily and repeat lab 09/17/2022.  Pt reports Dr Prince Rome had him stop the crestor about 2 months ago to see if it would help alleviate his joint pain.  It has not made a difference.  Advised pt to monitor s/s and let us know if the joint pain becomes worse after starting back.  Pt was appreciative of the call and information.

## 2022-07-23 ENCOUNTER — Ambulatory Visit: Payer: No Typology Code available for payment source

## 2022-08-04 ENCOUNTER — Ambulatory Visit: Payer: No Typology Code available for payment source

## 2022-08-04 ENCOUNTER — Ambulatory Visit (INDEPENDENT_AMBULATORY_CARE_PROVIDER_SITE_OTHER): Payer: No Typology Code available for payment source | Admitting: Orthopaedic Surgery

## 2022-08-04 ENCOUNTER — Encounter: Payer: Self-pay | Admitting: Orthopaedic Surgery

## 2022-08-04 ENCOUNTER — Ambulatory Visit (INDEPENDENT_AMBULATORY_CARE_PROVIDER_SITE_OTHER): Payer: No Typology Code available for payment source

## 2022-08-04 VITALS — Ht 69.75 in | Wt 261.0 lb

## 2022-08-04 DIAGNOSIS — M25551 Pain in right hip: Secondary | ICD-10-CM | POA: Diagnosis not present

## 2022-08-04 DIAGNOSIS — M1611 Unilateral primary osteoarthritis, right hip: Secondary | ICD-10-CM | POA: Diagnosis not present

## 2022-08-04 NOTE — Progress Notes (Addendum)
The patient is well-known to me.  He is a 51 year old who we have replaced his left hip.  He has been developing worsening right hip pain.  I seen him for this hip for 2 years now.  He is now having to ambulate the cane due to severe right hip pain and groin pain.  The left hip replacement has done well.  Earlier this year he did undergo valve replacement surgery.  He is on Coumadin.  Apparently he can come off Coumadin for 5 days prior to any type of joint replacement surgery.  His left operative hip moves smoothly and fluidly.  The right hip has essentially significant decrease in motion and rotation.  He is very stiff.  There is significant pain in the groin with rotation of the right hip.  Standing view of pelvis and lateral of the right hip are reviewed and compared to films in 2021.  The arthritis is gotten significantly worse and severe with his right hip.  There is now complete loss of joint space with bone-on-bone wear  There are large osteophytes around the hip.  There was still some joint space remaining 2 and half years ago but now the joint space is not remaining especially at the weightbearing surface of the hip.  He does have severe end-stage arthritis of his right hip and I agree with proceeding with hip replacement surgery for the right side.  He is not a candidate for an intra-articular injection at this standpoint for the right hip given the severe loss of joint space and it being bone-on-bone wear.  Also, given the amount of loss of joint space with bone-on-bone wear of the weightbearing surface of his right hip, physical therapy would not be an option because this would have no help on his right hip at this point given the severity of his arthritis.  Having had his left hip replacement in the past,  he is fully aware of the risk and benefits of surgery.  He will need to be off of Coumadin for 5 days prior to surgery and then we would start a bag in the evening of surgery.  All questions  and concerns were answered and addressed.  I did review all of his medications and notes within epic.

## 2022-08-12 ENCOUNTER — Ambulatory Visit: Payer: No Typology Code available for payment source | Attending: Cardiology

## 2022-08-12 DIAGNOSIS — Z5181 Encounter for therapeutic drug level monitoring: Secondary | ICD-10-CM | POA: Diagnosis not present

## 2022-08-12 DIAGNOSIS — Z952 Presence of prosthetic heart valve: Secondary | ICD-10-CM

## 2022-08-12 LAB — POCT INR: INR: 1.8 — AB (ref 2.0–3.0)

## 2022-08-12 NOTE — Patient Instructions (Signed)
continue taking 2 tablets (10mg ) daily. Stay consistent with your vitamin K intake/supplements the MD placed you on recently.  Recheck INR in 4 weeks. Anticoagulation Clinic: [phone]

## 2022-08-23 ENCOUNTER — Other Ambulatory Visit: Payer: Self-pay | Admitting: Cardiology

## 2022-08-23 DIAGNOSIS — I480 Paroxysmal atrial fibrillation: Secondary | ICD-10-CM

## 2022-09-09 ENCOUNTER — Ambulatory Visit: Payer: No Typology Code available for payment source | Attending: Internal Medicine

## 2022-09-09 DIAGNOSIS — Z952 Presence of prosthetic heart valve: Secondary | ICD-10-CM

## 2022-09-09 DIAGNOSIS — Z5181 Encounter for therapeutic drug level monitoring: Secondary | ICD-10-CM | POA: Diagnosis not present

## 2022-09-09 LAB — POCT INR: INR: 3.9 — AB (ref 2.0–3.0)

## 2022-09-09 NOTE — Patient Instructions (Signed)
HOLD TODAY ONLY THEN TAKE 1 TABLET ONLY WEDNESDAY THEN continue taking 2 tablets (10mg ) daily. Stay consistent with your vitamin K intake/supplements the MD placed you on recently.  Recheck INR in 3 weeks. Anticoagulation Clinic: [phone]

## 2022-09-17 ENCOUNTER — Ambulatory Visit: Payer: No Typology Code available for payment source | Attending: Cardiology

## 2022-09-17 DIAGNOSIS — Z5181 Encounter for therapeutic drug level monitoring: Secondary | ICD-10-CM

## 2022-09-17 DIAGNOSIS — E785 Hyperlipidemia, unspecified: Secondary | ICD-10-CM

## 2022-09-17 LAB — LIPID PANEL
Chol/HDL Ratio: 3.2 ratio (ref 0.0–5.0)
Cholesterol, Total: 122 mg/dL (ref 100–199)
HDL: 38 mg/dL — ABNORMAL LOW (ref >39)
LDL Chol Calc (NIH): 61 mg/dL (ref 0–99)
Triglycerides: 132 mg/dL (ref 0–149)
VLDL Cholesterol Cal: 23 mg/dL (ref 5–40)

## 2022-09-25 ENCOUNTER — Other Ambulatory Visit: Payer: Self-pay | Admitting: Physician Assistant

## 2022-09-25 DIAGNOSIS — Z01818 Encounter for other preprocedural examination: Secondary | ICD-10-CM

## 2022-09-29 ENCOUNTER — Telehealth: Payer: Self-pay

## 2022-09-29 NOTE — Telephone Encounter (Signed)
Prior auth for pts upcoming THA is pending with [employer]. Regan (nurse reviewer) with [facility] called and asked if a letter or addendum to last office note can be sent in stating that due to pts "bone on bone in the weight bearing portion of the hip formal PT would not be beneficial for this pt to have." Per their requirements for this year the PT requirement can be waived if the pt meets this criteria so they just need this stated in the note even though they have the imaging report that states bone on bone.

## 2022-09-30 ENCOUNTER — Other Ambulatory Visit: Payer: Self-pay

## 2022-09-30 ENCOUNTER — Ambulatory Visit: Payer: No Typology Code available for payment source | Attending: Cardiology | Admitting: *Deleted

## 2022-09-30 DIAGNOSIS — Z952 Presence of prosthetic heart valve: Secondary | ICD-10-CM

## 2022-09-30 DIAGNOSIS — Z5181 Encounter for therapeutic drug level monitoring: Secondary | ICD-10-CM | POA: Diagnosis not present

## 2022-09-30 LAB — POCT INR: INR: 1.7 — AB (ref 2.0–3.0)

## 2022-09-30 NOTE — Patient Instructions (Signed)
Description   Continue taking 2 tablets (10mg ) daily. Stay consistent with your vitamin K intake/supplements the MD placed you on recently. Recheck INR in 1 week post procedure. Anticoagulation Clinic: [phone] Procedure Fax [phone]

## 2022-09-30 NOTE — Pre-Procedure Instructions (Signed)
Surgical Instructions    Your procedure is scheduled on Tuesday, January 30th.  Report to [facility] Main Entrance "A" at 5:30 A.M., then check in with the Admitting office.  Call this number if you have problems the morning of surgery:  [phone]  If you have any questions prior to your surgery date call [phone]: Open Monday-Friday 8am-4pm If you experience any cold or flu symptoms such as cough, fever, chills, shortness of breath, etc. between now and your scheduled surgery, please notify us at the above number.     Remember:  Do not eat after midnight the night before your surgery  You may drink clear liquids until 4:30 a.m. the morning of your surgery.   Clear liquids allowed are: Water, Non-Citrus Juices (without pulp), Carbonated Beverages, Clear Tea, Black Coffee Only (NO MILK, CREAM OR POWDERED CREAMER of any kind), and Gatorade.   Enhanced Recovery after Surgery for Orthopedics Enhanced Recovery after Surgery is a protocol used to improve the stress on your body and your recovery after surgery.  Patient Instructions  The day of surgery (if you do NOT have diabetes):  Drink ONE (1) Pre-Surgery Clear Ensure by 4:30 am the morning of surgery   This drink was given to you during your hospital  pre-op appointment visit. Nothing else to drink after completing the  Pre-Surgery Clear Ensure.         If you have questions, please contact your surgeon's office.     Take these medicines the morning of surgery with A SIP OF WATER  rosuvastatin (CRESTOR)    Take these medications AS NEEDED: acetaminophen (TYLENOL)  nitroGLYCERIN (NITROSTAT)-please let a nurse know if you had to use this.   Follow your surgeon's instructions on when to stop Aspirin and warfarin (COUMADIN).  If no instructions were given by your surgeon then you will need to call the office to get those instructions.    As of today, STOP taking any Aleve, Naproxen, Ibuprofen, Motrin, Advil, Goody's, BC's,  all herbal medications, fish oil, and all vitamins.                     Do NOT Smoke (Tobacco/Vaping) for 24 hours prior to your procedure.  If you use a CPAP at night, you may bring your mask/headgear for your overnight stay.   Contacts, glasses, piercing's, hearing aid's, dentures or partials may not be worn into surgery, please bring cases for these belongings.    For patients admitted to the hospital, discharge time will be determined by your treatment team.   Patients discharged the day of surgery will not be allowed to drive home, and someone needs to stay with them for 24 hours.  SURGICAL WAITING ROOM VISITATION Patients having surgery or a procedure may have no more than 2 support people in the waiting area - these visitors may rotate.   Children under the age of 15 must have an adult with them who is not the patient. If the patient needs to stay at the hospital during part of their recovery, the visitor guidelines for inpatient rooms apply. Pre-op nurse will coordinate an appropriate time for 1 support person to accompany patient in pre-op.  This support person may not rotate.   Please refer to the [facility] website for the visitor guidelines for Inpatients (after your surgery is over and you are in a regular room).    Special instructions:   [place]- Preparing For Surgery  Before surgery, you can play an  important role. Because skin is not sterile, your skin needs to be as free of germs as possible. You can reduce the number of germs on your skin by washing with CHG (chlorahexidine gluconate) Soap before surgery.  CHG is an antiseptic cleaner which kills germs and bonds with the skin to continue killing germs even after washing.    Oral Hygiene is also important to reduce your risk of infection.  Remember - BRUSH YOUR TEETH THE MORNING OF SURGERY WITH YOUR REGULAR TOOTHPASTE  Please do not use if you have an allergy to CHG or antibacterial soaps. If your skin becomes  reddened/irritated stop using the CHG.  Do not shave (including legs and underarms) for at least 48 hours prior to first CHG shower. It is OK to shave your face.  Please follow these instructions carefully.   Shower the NIGHT BEFORE SURGERY and the MORNING OF SURGERY  If you chose to wash your hair, wash your hair first as usual with your normal shampoo.  After you shampoo, rinse your hair and body thoroughly to remove the shampoo.  Use CHG Soap as you would any other liquid soap. You can apply CHG directly to the skin and wash gently with a scrungie or a clean washcloth.   Apply the CHG Soap to your body ONLY FROM THE NECK DOWN.  Do not use on open wounds or open sores. Avoid contact with your eyes, ears, mouth and genitals (private parts). Wash Face and genitals (private parts)  with your normal soap.   Wash thoroughly, paying special attention to the area where your surgery will be performed.  Thoroughly rinse your body with warm water from the neck down.  DO NOT shower/wash with your normal soap after using and rinsing off the CHG Soap.  Pat yourself dry with a CLEAN TOWEL.  Wear CLEAN PAJAMAS to bed the night before surgery  Place CLEAN SHEETS on your bed the night before your surgery  DO NOT SLEEP WITH PETS.   Day of Surgery: Take a shower with CHG soap. Do not wear jewelry Do not wear lotions, powders, colognes, or deodorant. Do not shave 48 hours prior to surgery.  Men may shave face and neck. Do not bring valuables to the hospital.  [facility] is not responsible for any belongings or valuables. Wear Clean/Comfortable clothing the morning of surgery Remember to brush your teeth WITH YOUR REGULAR TOOTHPASTE.   Please read over the following fact sheets that you were given.    If you received a COVID test during your pre-op visit  it is requested that you wear a mask when out in public, stay away from anyone that may not be feeling well and notify your surgeon if  you develop symptoms. If you have been in contact with anyone that has tested positive in the last 10 days please notify you surgeon.  

## 2022-10-01 ENCOUNTER — Encounter (HOSPITAL_COMMUNITY): Payer: Self-pay

## 2022-10-01 ENCOUNTER — Encounter (HOSPITAL_COMMUNITY)
Admission: RE | Admit: 2022-10-01 | Discharge: 2022-10-01 | Disposition: A | Discharge status: Home / Self Care | Payer: No Typology Code available for payment source | Source: Ambulatory Visit | Attending: Orthopaedic Surgery | Admitting: Orthopaedic Surgery

## 2022-10-01 ENCOUNTER — Telehealth: Payer: Self-pay | Admitting: *Deleted

## 2022-10-01 ENCOUNTER — Other Ambulatory Visit: Payer: Self-pay

## 2022-10-01 DIAGNOSIS — Z01818 Encounter for other preprocedural examination: Secondary | ICD-10-CM

## 2022-10-01 DIAGNOSIS — Z01812 Encounter for preprocedural laboratory examination: Secondary | ICD-10-CM | POA: Diagnosis not present

## 2022-10-01 HISTORY — DX: Mucocutaneous lymph node syndrome (kawasaki): M30.3

## 2022-10-01 LAB — COMPREHENSIVE METABOLIC PANEL
ALT: 30 U/L (ref 0–44)
AST: 23 U/L (ref 15–41)
Albumin: 4.1 g/dL (ref 3.5–5.0)
Alkaline Phosphatase: 59 U/L (ref 38–126)
Anion gap: 6 (ref 5–15)
BUN: 15 mg/dL (ref 6–20)
CO2: 28 mmol/L (ref 22–32)
Calcium: 9.1 mg/dL (ref 8.9–10.3)
Chloride: 104 mmol/L (ref 98–111)
Creatinine, Ser: 0.98 mg/dL (ref 0.61–1.24)
GFR, Estimated: >60 mL/min (ref >60)
Glucose, Bld: 108 mg/dL — ABNORMAL HIGH (ref 70–99)
Potassium: 4.3 mmol/L (ref 3.5–5.1)
Sodium: 138 mmol/L (ref 135–145)
Total Bilirubin: 0.6 mg/dL (ref 0.3–1.2)
Total Protein: 7.1 g/dL (ref 6.5–8.1)

## 2022-10-01 LAB — CBC
HCT: 46.9 % (ref 39.0–52.0)
Hemoglobin: 14.9 g/dL (ref 13.0–17.0)
MCH: 28.7 pg (ref 26.0–34.0)
MCHC: 31.8 g/dL (ref 30.0–36.0)
MCV: 90.4 fL (ref 80.0–100.0)
Platelets: 303 10*3/uL (ref 150–400)
RBC: 5.19 MIL/uL (ref 4.22–5.81)
RDW: 13 % (ref 11.5–15.5)
WBC: 8.1 10*3/uL (ref 4.0–10.5)
nRBC: 0 % (ref 0.0–0.2)

## 2022-10-01 LAB — TYPE AND SCREEN
ABO/RH(D): O POS
Antibody Screen: NEGATIVE

## 2022-10-01 LAB — SURGICAL PCR SCREEN
MRSA, PCR: NEGATIVE
Staphylococcus aureus: NEGATIVE

## 2022-10-01 NOTE — Pre-Procedure Instructions (Signed)
PCP - Eunice Blase, MD Cardiologist - Dr Marlou Porch  PPM/ICD - denies    Chest x-ray - 10/17/21 EKG - 11/623 Stress Test - pt reports abnormal stress test- 2013? Prior to cath ECHO - 11/04/21 Cardiac Cath - 12/19/20  Sleep Study - denies   Fasting Blood Sugar - N/A   Last dose of GLP1 agonist-  N/A   Blood Thinner Instructions: Pt reports he was told to hold Coumadin for 5 days and that a lovenox bridge was not needed.  Aspirin Instructions: Follow your surgeon's instructions on when to stop Aspirin.  If no instructions were given by your surgeon then you will need to call the office to get those instructions.  Pt to call Dr. Trevor Mace office for instructions.   ERAS Protcol - ERAS + ensure   COVID TEST- N/A   Anesthesia review: cardiac history, pt denies any recent cardiac issues. Pt reports he wore a monitor in  November after having a syncopal episode. Pt reports he was told that he had normal results from monitor.   Patient denies shortness of breath, fever, cough and chest pain at PAT appointment   All instructions explained to the patient, with a verbal understanding of the material. Patient agrees to go over the instructions while at home for a better understanding. Patient also instructed to self quarantine after being tested for COVID-19. The opportunity to ask questions was provided.

## 2022-10-01 NOTE — Pre-Procedure Instructions (Signed)
Surgical Instructions    Your procedure is scheduled on Tuesday, January 30th.  Report to [facility] Main Entrance "A" at 5:30 A.M., then check in with the Admitting office.  Call this number if you have problems the morning of surgery:  [phone]  If you have any questions prior to your surgery date call [phone]: Open Monday-Friday 8am-4pm If you experience any cold or flu symptoms such as cough, fever, chills, shortness of breath, etc. between now and your scheduled surgery, please notify us at the above number.     Remember:  Do not eat after midnight the night before your surgery  You may drink clear liquids until 4:30 a.m. the morning of your surgery.   Clear liquids allowed are: Water, Non-Citrus Juices (without pulp), Carbonated Beverages, Clear Tea, Black Coffee Only (NO MILK, CREAM OR POWDERED CREAMER of any kind), and Gatorade.   Enhanced Recovery after Surgery for Orthopedics Enhanced Recovery after Surgery is a protocol used to improve the stress on your body and your recovery after surgery.  Patient Instructions  The day of surgery (if you do NOT have diabetes):  Drink ONE (1) Pre-Surgery Clear Ensure by 4:30 am the morning of surgery   This drink was given to you during your hospital  pre-op appointment visit. Nothing else to drink after completing the  Pre-Surgery Clear Ensure.         If you have questions, please contact your surgeon's office.     Take these medicines the morning of surgery with A SIP OF WATER  rosuvastatin (CRESTOR)    Take these medications AS NEEDED: acetaminophen (TYLENOL)  nitroGLYCERIN (NITROSTAT)-please let a nurse know if you had to use this.   Follow your surgeon's instructions on when to stop Aspirin and warfarin (COUMADIN).  If no instructions were given by your surgeon then you will need to call the office to get those instructions.    As of today, STOP taking any Aleve, Naproxen, Ibuprofen, Motrin, Advil, Goody's, BC's,  all herbal medications, fish oil, and all vitamins.                     Do NOT Smoke (Tobacco/Vaping) for 24 hours prior to your procedure.  If you use a CPAP at night, you may bring your mask/headgear for your overnight stay.   Contacts, glasses, piercing's, hearing aid's, dentures or partials may not be worn into surgery, please bring cases for these belongings.    For patients admitted to the hospital, discharge time will be determined by your treatment team.   Patients discharged the day of surgery will not be allowed to drive home, and someone needs to stay with them for 24 hours.  SURGICAL WAITING ROOM VISITATION Patients having surgery or a procedure may have no more than 2 support people in the waiting area - these visitors may rotate.   Children under the age of 15 must have an adult with them who is not the patient. If the patient needs to stay at the hospital during part of their recovery, the visitor guidelines for inpatient rooms apply. Pre-op nurse will coordinate an appropriate time for 1 support person to accompany patient in pre-op.  This support person may not rotate.   Please refer to the [facility] website for the visitor guidelines for Inpatients (after your surgery is over and you are in a regular room).    Special instructions:   [place]- Preparing For Surgery  Before surgery, you can play an  important role. Because skin is not sterile, your skin needs to be as free of germs as possible. You can reduce the number of germs on your skin by washing with CHG (chlorahexidine gluconate) Soap before surgery.  CHG is an antiseptic cleaner which kills germs and bonds with the skin to continue killing germs even after washing.    Oral Hygiene is also important to reduce your risk of infection.  Remember - BRUSH YOUR TEETH THE MORNING OF SURGERY WITH YOUR REGULAR TOOTHPASTE  Please do not use if you have an allergy to CHG or antibacterial soaps. If your skin becomes  reddened/irritated stop using the CHG.  Do not shave (including legs and underarms) for at least 48 hours prior to first CHG shower. It is OK to shave your face.  Please follow these instructions carefully.   Shower the NIGHT BEFORE SURGERY and the MORNING OF SURGERY  If you chose to wash your hair, wash your hair first as usual with your normal shampoo.  After you shampoo, rinse your hair and body thoroughly to remove the shampoo.  Use CHG Soap as you would any other liquid soap. You can apply CHG directly to the skin and wash gently with a scrungie or a clean washcloth.   Apply the CHG Soap to your body ONLY FROM THE NECK DOWN.  Do not use on open wounds or open sores. Avoid contact with your eyes, ears, mouth and genitals (private parts). Wash Face and genitals (private parts)  with your normal soap.   Wash thoroughly, paying special attention to the area where your surgery will be performed.  Thoroughly rinse your body with warm water from the neck down.  DO NOT shower/wash with your normal soap after using and rinsing off the CHG Soap.  Pat yourself dry with a CLEAN TOWEL.  Wear CLEAN PAJAMAS to bed the night before surgery  Place CLEAN SHEETS on your bed the night before your surgery  DO NOT SLEEP WITH PETS.   Day of Surgery: Take a shower with CHG soap. Do not wear jewelry Do not wear lotions, powders, colognes, or deodorant. Do not shave 48 hours prior to surgery.  Men may shave face and neck. Do not bring valuables to the hospital.  [facility] is not responsible for any belongings or valuables. Wear Clean/Comfortable clothing the morning of surgery Remember to brush your teeth WITH YOUR REGULAR TOOTHPASTE.   Please read over the following fact sheets that you were given.    If you received a COVID test during your pre-op visit  it is requested that you wear a mask when out in public, stay away from anyone that may not be feeling well and notify your surgeon if  you develop symptoms. If you have been in contact with anyone that has tested positive in the last 10 days please notify you surgeon.

## 2022-10-01 NOTE — Telephone Encounter (Signed)
   Name: Gabriel Anderson  DOB: July 08, 1971  MRN: [id number]  Primary Cardiologist: Candee Furbish, MD   Preoperative team, please contact this patient and set up a phone call appointment for further preoperative risk assessment. Please obtain consent and complete medication review. Thank you for your help.  I confirm that guidance regarding antiplatelet and oral anticoagulation therapy has been completed and, if necessary, noted below.  Per office protocol, patient can hold warfarin for 5 days prior to procedure.   Patient will NOT need bridging with Lovenox (enoxaparin) around procedur   Lenna Sciara, NP 10/01/2022, 2:37 PM [place]

## 2022-10-01 NOTE — Telephone Encounter (Signed)
Patient with diagnosis of mechanical aortic valve on warfarin for anticoagulation.    Procedure: hip replacement Date of procedure: 10/07/22  He had post op afib, none since.  Per office protocol, patient can hold warfarin for 5 days prior to procedure.   Patient will NOT need bridging with Lovenox (enoxaparin) around procedure.  **This guidance is not considered finalized until pre-operative APP has relayed final recommendations.**

## 2022-10-01 NOTE — Telephone Encounter (Signed)
   Pre-operative Risk Assessment    Patient Name: Gabriel Anderson  DOB: [birth year] MRN: [id number]     Request for Surgical Clearance    Procedure:  SPINAL SURGERY   Date of Surgery:  Clearance 10/07/22                                 Surgeon:  Santiago Bumpers Surgeon's Group or Practice Name:  [facility] Phone number:  [phone] Fax number:  [phone] ATT SHERRIE   Type of Clearance Requested:   - Medical  - Pharmacy:  Hold Warfarin (Coumadin) 5 DAYS PRIOR   Type of Anesthesia:  Spinal {  Signed, Claude Manges   10/01/2022, 11:58 AM

## 2022-10-01 NOTE — Progress Notes (Signed)
   10/01/22 1017  OBSTRUCTIVE SLEEP APNEA  Have you ever been diagnosed with sleep apnea through a sleep study? No  Do you snore loudly (loud enough to be heard through closed doors)?  0  Do you often feel tired, fatigued, or sleepy during the daytime (such as falling asleep during driving or talking to someone)? 0  Has anyone observed you stop breathing during your sleep? 0  Do you have, or are you being treated for high blood pressure? 1  BMI more than 35 kg/m2? 1  Age > 50 (1-yes) 1  Neck circumference greater than:Male 16 inches or larger, Male 17inches or larger? 1  Male Gender (Yes=1) 1  Obstructive Sleep Apnea Score 5  Score 5 or greater  Results sent to PCP

## 2022-10-02 ENCOUNTER — Telehealth: Payer: Self-pay | Admitting: *Deleted

## 2022-10-02 NOTE — Progress Notes (Addendum)
Anesthesia Chart Review:  Case: [id number] Date/Time: 10/07/22 0715   Procedure: RIGHT TOTAL HIP ARTHROPLASTY ANTERIOR APPROACH (Right: Hip)   Anesthesia type: Spinal   Pre-op diagnosis: osteoarthritis right hip   Location: [facility] OR ROOM 05 / [place] OR   Surgeons: Mcarthur Rossetti, MD       DISCUSSION: Patient is a 52 year old male scheduled for the above procedure.    History includes never smoker, HTN, HLD, CAD (overlapping DES x2 mid LAD 05/21/12 for chest pain and NSVT during aborted stress test; NSTEMI 06/01/12 with widely patent stents, heavily disease septal arcade, medical therapy), bicuspid AV with severe AS and 4.4 cm ascending thoracic aortic aneurysm (s/p supra-coronary replacement of ascending aorta using 28 mm Hemashield graft, AV replacement using 23 mm On-X mechanical AVR 09/16/21; had PAF post-op), Raynaud's disease/vasospasm, dyspnea, anemia, osteoarthritis (s/p left THA 12/30/13; right reverse total shoulder 05/23/20). BMI is consistent with obesity.  OSA screening score was 5.  Last cardiology follow-up with Dr. Marlou Porch was on 07/14/22. Holter monitor was ordered for tachypapitations on Apple Watch--monitor reassuring without afib. He noted that patient may require THA in the future and indicated that warfarin could be held for 7 days per guidelines with his mechanical AVR but if duration longer then may have to utilize Lovenox bridging. He has a preoperative telephonic cardiology evaluation on 10/03/22.   Reportedly he was told to hold warfarin for 5 days prior to surgery. INR 1.7 on 09/30/22, so he will need a repeat INR on the day of surgery.   ADDENDUM 09/23/22 5:30 PM: Preoperative cardiology input outlined by Nicholes Rough, PA-C on 10/03/21: "Mr. Spade's perioperative risk of a major cardiac event is 0.9% according to the Revised Cardiac Risk Index (RCRI).  Therefore, he is at low risk for perioperative complications.   His functional capacity is fair at 4.64 METs according to the  [facility] Activity Status Index (DASI). Recommendations: According to ACC/AHA guidelines, no further cardiovascular testing needed.  The patient may proceed to surgery at acceptable risk.   Antiplatelet and/or Anticoagulation Recommendations:   Coumadin can by held for 5 days prior to surgery.  Please resume post op when felt to be safe."    VS: BP (!) 156/78 Comment: right arm  Pulse 61   Temp (!) 36.4 C   Resp 17   Ht 5\' 10"  (1.778 m)   Wt 118.4 kg   SpO2 100%   BMI 37.45 kg/m   PROVIDERS: Hilts, Legrand Como, MD is PCP Candee Furbish, MD is cardiologist Gilford Raid, MD is CT surgeon   LABS: Labs reviewed: Acceptable for surgery. For PT/INR on arrival. (all labs ordered are listed, but only abnormal results are displayed)  Labs Reviewed  COMPREHENSIVE METABOLIC PANEL - Abnormal; Notable for the following components:      Result Value   Glucose, Bld 108 (*)    All other components within normal limits  SURGICAL PCR SCREEN  CBC  TYPE AND SCREEN    IMAGES: CXR 10/16/21: FINDINGS: The heart has decreased to normal size since the prior study. Intact median sternotomy sutures are again noted. There is an aortic valve prosthesis and old LAD coronary artery stenting. No vascular congestion is evident. No pleural effusion is seen. Stable mediastinum. The lungs are clear. A reverse right shoulder arthroplasty is again shown. IMPRESSION: No active cardiopulmonary disease.  Aortic valve replacement.     EKG: 07/14/22: NSR. Incomplete right BBB. Non-specific ST abnormality.    CV: Long term monitor 07/17/22 -  07/31/22:   Reassuring monitor   No atrial fibrillation or other adverse rhythm   Sensation of fluttering or racing was normal sinus rhythm    Echo 11/04/21: IMPRESSIONS   1. S/P AVR with mean gradient 7 mmHg, DI 0.63 and mild AI.   2. Left ventricular ejection fraction, by estimation, is 50 to 55%. The  left ventricle has low normal function. The left ventricle has no  regional  wall motion abnormalities. There is mild left ventricular hypertrophy.  Left ventricular diastolic  parameters were normal. Elevated left atrial pressure.   3. Right ventricular systolic function is normal. The right ventricular  size is normal. Tricuspid regurgitation signal is inadequate for assessing  PA pressure.   4. The mitral valve is normal in structure. Mild mitral valve  regurgitation. No evidence of mitral stenosis.   5. The aortic valve has been repaired/replaced. Aortic valve  regurgitation is mild. No aortic stenosis is present. There is a 23 mm  ON-X mechanical valve valve present in the aortic position. Procedure  Date: 09/16/21.   6. The inferior vena cava is normal in size with greater than 50%  respiratory variability, suggesting right atrial pressure of 3 mmHg.    US Carotid 09/12/21: Summary:  Right Carotid: Velocities in the right ICA are consistent with a 1-39%  stenosis.  Left Carotid: Velocities in the left ICA are consistent with a 1-39%  stenosis.  Vertebrals:  Bilateral vertebral arteries demonstrate antegrade flow.  Subclavians: Normal flow hemodynamics were seen in bilateral subclavian arteries.      RHC/LHC 12/19/20 (PRE-AVR): Prox LAD to Mid LAD lesion is 20% stenosed. Mid LAD lesion is 20% stenosed. LV end diastolic pressure is moderately elevated. There is moderate aortic valve stenosis.   POST-OPERATIVE DIAGNOSIS:   Widely patent LAD stent with maybe 20% prestent stenosis and 20% at the distal edge of the stent.  Otherwise, no significant CAD other than long 30% proximal RCA stenosis. Discrepant cardiac output by Fick and thermodilution: Fick CO-CI 6.87-3; thermodilution 5.61-2.45 Right Heart Cath Pressures:  RA mean 7 mmHg RVP-EDP: 32/2 mmHg-9 mmHg;  PCWP 12 mmHg; PA P 28/6 mmHg-mean 13 mmHg. LVP-EDP: 174/5 mmHg - 22 mmHg; AoP - MAP: 141/73 mmHg-100 mmHg. Aortic valve gradients: P-P 33 mmHg, mean 41.9 mm.  AVA estimated between 0.95 and  1.16 cm -> also discrepant findings, would suggest moderate to severe AS. - S/p supra-coronary replacement of ascending aorta using 28 mm Hemashield graft, AV replacement using 23 mm On-X mechanical AVR 09/16/21   Past Medical History:  Diagnosis Date   Acute myocardial infarction, subendocardial infarction, initial episode of care [facility]) 2013   Cardiac catheterization 06/02/12-widely patent previously placed LAD stents. Troponin peaked 3. Normal EF.   Anemia    Arthritis    Ascending aortic aneurysm (HCC) 08/26/2021   4.4 cm on 05/16/21 CT   Coronary atherosclerosis of native coronary artery 05/21/2012   Cardiac catheterization 05/21/12-95% mid LAD lesion at the bifurcation of a large diagonal branch. Normal ejection fraction.   Dyspnea    Heart murmur    aortic stenosis- prior to valve replacement   Hyperlipidemia    Hypertension    Kawasaki syndrome (HCC)    early 2000's per patient   Pneumonia    Raynaud's disease    Vasospasm [facility])     Past Surgical History:  Procedure Laterality Date    cardiacstent x 2  09/09/2011   AORTIC VALVE REPLACEMENT N/A 09/16/2021   Procedure: AORTIC VALVE REPLACEMENT (AVR)  USING ON-X 23MM PROSTHETIC VALVE;  Surgeon: Gaye Pollack, MD;  Location: [place] OR;  Service: Open Heart Surgery;  Laterality: N/A;  Circ Arrest   CARDIAC CATHETERIZATION  05/09/2012   PCI LAD at takeoff of large diagonal   ELBOW SURGERY Left 09/08/1982   LEFT HEART CATHETERIZATION WITH CORONARY ANGIOGRAM N/A 05/21/2012   Procedure: LEFT HEART CATHETERIZATION WITH CORONARY ANGIOGRAM;  Surgeon: Jettie Booze, MD;  Location: [facility] CATH LAB;  Service: Cardiovascular;  Laterality: N/A;   LEFT HEART CATHETERIZATION WITH CORONARY ANGIOGRAM N/A 06/02/2012   Procedure: LEFT HEART CATHETERIZATION WITH CORONARY ANGIOGRAM;  Surgeon: Candee Furbish, MD;  Location: [facility] CATH LAB;  Service: Cardiovascular;  Laterality: N/A;   PERCUTANEOUS CORONARY STENT INTERVENTION (PCI-S)  05/21/2012   Procedure:  PERCUTANEOUS CORONARY STENT INTERVENTION (PCI-S);  Surgeon: Jettie Booze, MD;  Location: [facility] CATH LAB;  Service: Cardiovascular;;   REPLACEMENT ASCENDING AORTA N/A 09/16/2021   Procedure: REPLACEMENT OF ASCENDING AORTA USING HEMASHIELD PLATINUM 28MM X10MM G28ZM GRAFT;  Surgeon: Gaye Pollack, MD;  Location: [place];  Service: Open Heart Surgery;  Laterality: N/A;   REVERSE SHOULDER ARTHROPLASTY Right 05/23/2020   Procedure: REVERSE SHOULDER ARTHROPLASTY;  Surgeon: Hiram Gash, MD;  Location: [facility] ORS;  Service: Orthopedics;  Laterality: Right;   RIGHT/LEFT HEART CATH AND CORONARY ANGIOGRAPHY N/A 12/19/2020   Procedure: RIGHT/LEFT HEART CATH AND CORONARY ANGIOGRAPHY;  Surgeon: Leonie Man, MD;  Location: [place] CV LAB;  Service: Cardiovascular;  Laterality: N/A;   ROTATOR CUFF REPAIR Right 09/09/2007   TEE WITHOUT CARDIOVERSION N/A 09/16/2021   Procedure: TRANSESOPHAGEAL ECHOCARDIOGRAM (TEE);  Surgeon: Gaye Pollack, MD;  Location: [place];  Service: Open Heart Surgery;  Laterality: N/A;   TONSILLECTOMY     70s   TOTAL HIP ARTHROPLASTY Left 12/30/2013   Procedure: LEFT TOTAL HIP ARTHROPLASTY ANTERIOR APPROACH;  Surgeon: Mcarthur Rossetti, MD;  Location: [facility] ORS;  Service: Orthopedics;  Laterality: Left;    MEDICATIONS:  acetaminophen (TYLENOL) 500 MG tablet   acidophilus (RISAQUAD) CAPS capsule   amoxicillin (AMOXIL) 500 MG tablet   aspirin 81 MG tablet   Cholecalciferol (D3 SUPER STRENGTH PO)   nitroGLYCERIN (NITROSTAT) 0.4 MG SL tablet   rosuvastatin (CRESTOR) 40 MG tablet   warfarin (COUMADIN) 5 MG tablet   No current facility-administered medications for this encounter.    Myra Gianotti, PA-C Surgical Short Stay/Anesthesiology [facility] Phone [phone] [facility] Phone [phone] 10/02/2022 3:12 PM

## 2022-10-02 NOTE — Anesthesia Preprocedure Evaluation (Addendum)
Anesthesia Evaluation  Patient identified by MRN, date of birth, ID band Patient awake    Reviewed: Allergy & Precautions, NPO status , Patient's Chart, lab work & pertinent test results, reviewed documented beta blocker date and time   Airway Mallampati: II  TM Distance: >3 FB Neck ROM: Full    Dental  (+) Poor Dentition, Missing, Dental Advisory Given,    Pulmonary shortness of breath, pneumonia, resolved   Pulmonary exam normal breath sounds clear to auscultation       Cardiovascular hypertension, + CAD, + Past MI, + Cardiac Stents and + DOE  Normal cardiovascular exam+ dysrhythmias Atrial Fibrillation + Valvular Problems/Murmurs AS  Rhythm:Regular Rate:Normal  Hx/o AVR with conduit Hx/o Stent x 2 mid LAD 2013  EKG 07/14/22 NSr, Incomplete RBBB pattern  Echo 11/04/21 1. S/P AVR with mean gradient 7 mmHg, DI 0.63 and mild AI.   2. Left ventricular ejection fraction, by estimation, is 50 to 55%. The  left ventricle has low normal function. The left ventricle has no regional  wall motion abnormalities. There is mild left ventricular hypertrophy.  Left ventricular diastolic parameters were normal. Elevated left atrial pressure.   3. Right ventricular systolic function is normal. The right ventricular  size is normal. Tricuspid regurgitation signal is inadequate for assessing  PA pressure.   4. The mitral valve is normal in structure. Mild mitral valve  regurgitation. No evidence of mitral stenosis.   5. The aortic valve has been repaired/replaced. Aortic valve  regurgitation is mild. No aortic stenosis is present. There is a 23 mm  ON-X mechanical valve valve present in the aortic position. Procedure  Date: 09/16/21.   6. The inferior vena cava is normal in size with greater than 50%  respiratory variability, suggesting right atrial pressure of 3 mmHg.   Cardiac Cat 12/19/20  Prox LAD to Mid LAD lesion is 20% stenosed.  Mid  LAD lesion is 20% stenosed.  LV end diastolic pressure is moderately elevated.  There is moderate aortic valve stenosis.   POST-OPERATIVE DIAGNOSIS:    Widely patent LAD stent with maybe 20% prestent stenosis and 20% at the distal edge of the stent.  Otherwise, no significant CAD other than long 30% proximal RCA stenosis.  Discrepant cardiac output by Fick and thermodilution: Fick CO-CI 6.87-3; thermodilution 5.61-2.45  Right Heart Cath Pressures:   RA mean 7 mmHg RVP-EDP: 32/2 mmHg-9 mmHg;   PCWP 12 mmHg; PA P 28/6 mmHg-mean 13 mmHg.  LVP-EDP: 174/5 mmHg - 22 mmHg; AoP - MAP: 141/73 mmHg-100 mmHg.  Aortic valve gradients: P-P 33 mmHg, mean 41.9 mm.  AVA estimated between 0.95 and 1.16 cm -> also discrepant findings, would suggest moderate to severe AS.     Neuro/Psych negative neurological ROS  negative psych ROS   GI/Hepatic negative GI ROS, Neg liver ROS,,,  Endo/Other  Hyperlipidemia Obesity  Renal/GU negative Renal ROS  negative genitourinary   Musculoskeletal  (+) Arthritis , Osteoarthritis,  OA right hip   Abdominal  (+) + obese  Peds  Hematology  (+) Blood dyscrasia, anemia Coumadin therapy- last dose     PT- 13.4 INR-1.2   Anesthesia Other Findings   Reproductive/Obstetrics                             Anesthesia Physical Anesthesia Plan  ASA: 3  Anesthesia Plan: Spinal   Post-op Pain Management: Minimal or no pain anticipated   Induction: Intravenous  PONV  Risk Score and Plan: 1 and Treatment may vary due to age or medical condition, Ondansetron and Propofol infusion  Airway Management Planned: Natural Airway and Simple Face Mask  Additional Equipment: None  Intra-op Plan:   Post-operative Plan:   Informed Consent: I have reviewed the patients History and Physical, chart, labs and discussed the procedure including the risks, benefits and alternatives for the proposed anesthesia with the patient or authorized  representative who has indicated his/her understanding and acceptance.     Dental advisory given  Plan Discussed with: CRNA and Anesthesiologist  Anesthesia Plan Comments: (PAT note written by Myra Gianotti, PA-C.  )       Anesthesia Quick Evaluation

## 2022-10-02 NOTE — Telephone Encounter (Signed)
  Patient Consent for Virtual Visit         Gabriel Anderson has provided verbal consent on 10/02/2022 for a virtual visit (video or telephone).   CONSENT FOR VIRTUAL VISIT FOR:  Gabriel Anderson  By participating in this virtual visit I agree to the following:  I hereby voluntarily request, consent and authorize [facility] and its employed or contracted physicians, physician assistants, nurse practitioners or other licensed health care professionals (the Practitioner), to provide me with telemedicine health care services (the "Services") as deemed necessary by the treating Practitioner. I acknowledge and consent to receive the Services by the Practitioner via telemedicine. I understand that the telemedicine visit will involve communicating with the Practitioner through live audiovisual communication technology and the disclosure of certain medical information by electronic transmission. I acknowledge that I have been given the opportunity to request an in-person assessment or other available alternative prior to the telemedicine visit and am voluntarily participating in the telemedicine visit.  I understand that I have the right to withhold or withdraw my consent to the use of telemedicine in the course of my care at any time, without affecting my right to future care or treatment, and that the Practitioner or I may terminate the telemedicine visit at any time. I understand that I have the right to inspect all information obtained and/or recorded in the course of the telemedicine visit and may receive copies of available information for a reasonable fee.  I understand that some of the potential risks of receiving the Services via telemedicine include:  Delay or interruption in medical evaluation due to technological equipment failure or disruption; Information transmitted may not be sufficient (e.g. poor resolution of images) to allow for appropriate medical decision making by the  Practitioner; and/or  In rare instances, security protocols could fail, causing a breach of personal health information.  Furthermore, I acknowledge that it is my responsibility to provide information about my medical history, conditions and care that is complete and accurate to the best of my ability. I acknowledge that Practitioner's advice, recommendations, and/or decision may be based on factors not within their control, such as incomplete or inaccurate data provided by me or distortions of diagnostic images or specimens that may result from electronic transmissions. I understand that the practice of medicine is not an exact science and that Practitioner makes no warranties or guarantees regarding treatment outcomes. I acknowledge that a copy of this consent can be made available to me via my patient portal ([facility]), or I can request a printed copy by calling the office of [facility].    I understand that my insurance will be billed for this visit.   I have read or had this consent read to me. I understand the contents of this consent, which adequately explains the benefits and risks of the Services being provided via telemedicine.  I have been provided ample opportunity to ask questions regarding this consent and the Services and have had my questions answered to my satisfaction. I give my informed consent for the services to be provided through the use of telemedicine in my medical care

## 2022-10-02 NOTE — Telephone Encounter (Signed)
Pt has STOPPED all medications starting yesterday except for Crestor per PCP.  Clearance was mentioned in Dr. Marlou Porch November f/u appt. Pt is scheduled tomorrow, with Nicholes Rough, PA for telephone appt.  Pt's surgery is Tuesday and does not need a lovenox bridge per Dr. Marlou Porch.

## 2022-10-03 ENCOUNTER — Ambulatory Visit: Payer: No Typology Code available for payment source | Attending: Cardiology | Admitting: Physician Assistant

## 2022-10-03 DIAGNOSIS — Z01810 Encounter for preprocedural cardiovascular examination: Secondary | ICD-10-CM | POA: Diagnosis not present

## 2022-10-03 NOTE — Progress Notes (Signed)
Virtual Visit via Telephone Note   Because of Gabriel Anderson's co-morbid illnesses, he is at least at moderate risk for complications without adequate follow up.  This format is felt to be most appropriate for this patient at this time.  The patient did not have access to video technology/had technical difficulties with video requiring transitioning to audio format only (telephone).  All issues noted in this document were discussed and addressed.  No physical exam could be performed with this format.  Please refer to the patient's chart for his consent to telehealth for Gabriel [facility].  Evaluation Performed:  Preoperative cardiovascular risk assessment _____________   Date:  10/03/2022   Patient ID:  AVIN Gabriel Anderson, DOB September 30, 1970, MRN [id number] Patient Location:  Home Provider location:   Office  Primary Care Provider:  Eunice Blase, MD Primary Cardiologist:  Candee Furbish, MD  Chief Complaint / Patient Profile   52 y.o. y/o male with a h/o aortic valve replacement and super coronary replacement of an ascending aortic aneurysm on 09/16/2021, CAD with LAD stent, on Coumadin, hyperlipidemia, hypertension, Raynaud's disease, who is pending total hip replacement and presents today for telephonic preoperative cardiovascular risk assessment.  History of Present Illness    Gabriel Anderson is a 52 y.o. male who presents via audio/video conferencing for a telehealth visit today.  Pt was last seen in cardiology clinic on 07/14/2022 by Dr. Marlou Porch.  At that time Gabriel Anderson was having some dizziness/lightheadedness. He was also having some palpitations so a monitor was ordered.  Monitor reviewed and there was no evidence of atrial fibrillation or adverse arrhythmia.  Normal sinus rhythm.  The patient is now pending procedure as outlined above. Since his last visit, he tells me that his heart is fine.  He is not able to exercise like he wants to but his hip has been the problem for the  last several months.  After his valve replacement he was weighing to 45 pounds and felt great.  He has been putting off his hip surgery for as long as he possibly can.  Finally, Dr. [name] told him he is bone-on-bone and he is going to have to have it replaced.  He did have some episodes of palpitations but ended up wearing a ZIO back in November which did not show any dangerous arrhythmias.  He has been off alcohol and cannabis the last couple of weeks to try to flush out his system.  He did have a little fever yesterday but that seems to have subsided.  He was taking liquid IV which I told him to discontinue because it does have added vitamins and minerals.  He has no issues with stairs or walking short distance.  For this reason he has surpassed the minimum 4 METS requirement.  Per office protocol, patient can hold warfarin for 5 days prior to procedure.  (He stopped taking this Wed). Patient will NOT need bridging with Lovenox (enoxaparin) around procedure.  Past Medical History    Past Medical History:  Diagnosis Date   Acute myocardial infarction, subendocardial infarction, initial episode of care [facility]) 2013   Cardiac catheterization 06/02/12-widely patent previously placed LAD stents. Troponin peaked 3. Normal EF.   Anemia    Arthritis    Ascending aortic aneurysm (HCC) 08/26/2021   4.4 cm on 05/16/21 CT   Coronary atherosclerosis of native coronary artery 05/21/2012   Cardiac catheterization 05/21/12-95% mid LAD lesion at the bifurcation of a large diagonal branch. Normal ejection fraction.  Dyspnea    Heart murmur    aortic stenosis- prior to valve replacement   Hyperlipidemia    Hypertension    Kawasaki syndrome (HCC)    early 2000's per patient   Pneumonia    Raynaud's disease    Vasospasm (HCC)    Past Surgical History:  Procedure Laterality Date    cardiacstent x 2  09/09/2011   AORTIC VALVE REPLACEMENT N/A 09/16/2021   Procedure: AORTIC VALVE REPLACEMENT (AVR) USING ON-X  PROSTHETIC VALVE;  Surgeon: Alleen Borne, MD;  [facility]: [facility];  Service: Open Heart Surgery;  Laterality: N/A;  Circ Arrest   CARDIAC CATHETERIZATION  05/09/2012   PCI LAD at takeoff of large diagonal   ELBOW SURGERY Left 09/08/1982   LEFT HEART CATHETERIZATION WITH CORONARY ANGIOGRAM N/A 05/21/2012   Procedure: LEFT HEART CATHETERIZATION WITH CORONARY ANGIOGRAM;  Surgeon: Corky Crafts, MD;  Location: [facility] CATH LAB;  Service: Cardiovascular;  Laterality: N/A;   LEFT HEART CATHETERIZATION WITH CORONARY ANGIOGRAM N/A 06/02/2012   Procedure: LEFT HEART CATHETERIZATION WITH CORONARY ANGIOGRAM;  Surgeon: Donato Schultz, MD;  Location: [facility] CATH LAB;  Service: Cardiovascular;  Laterality: N/A;   PERCUTANEOUS CORONARY STENT INTERVENTION (PCI-S)  05/21/2012   Procedure: PERCUTANEOUS CORONARY STENT INTERVENTION (PCI-S);  Surgeon: Corky Crafts, MD;  Location: [facility] CATH LAB;  Service: Cardiovascular;;   REPLACEMENT ASCENDING AORTA N/A 09/16/2021   Procedure: REPLACEMENT OF ASCENDING AORTA USING HEMASHIELD PLATINUM X10MM Z30QM GRAFT;  Surgeon: Alleen Borne, MD;  Location: [facility] OR;  Service: Open Heart Surgery;  Laterality: N/A;   REVERSE SHOULDER ARTHROPLASTY Right 05/23/2020   Procedure: REVERSE SHOULDER ARTHROPLASTY;  Surgeon: Bjorn Pippin, MD;  Location: [facility] ORS;  Service: Orthopedics;  Laterality: Right;   RIGHT/LEFT HEART CATH AND CORONARY ANGIOGRAPHY N/A 12/19/2020   Procedure: RIGHT/LEFT HEART CATH AND CORONARY ANGIOGRAPHY;  Surgeon: Marykay Lex, MD;  Location: [facility] INVASIVE CV LAB;  Service: Cardiovascular;  Laterality: N/A;   ROTATOR CUFF REPAIR Right 09/09/2007   TEE WITHOUT CARDIOVERSION N/A 09/16/2021   Procedure: TRANSESOPHAGEAL ECHOCARDIOGRAM (TEE);  Surgeon: Alleen Borne, MD;  Location: [facility] OR;  Service: Open Heart Surgery;  Laterality: N/A;   TONSILLECTOMY     70s   TOTAL HIP ARTHROPLASTY Left 12/30/2013   Procedure: LEFT TOTAL HIP ARTHROPLASTY ANTERIOR APPROACH;   Surgeon: Kathryne Hitch, MD;  Location: [facility] ORS;  Service: Orthopedics;  Laterality: Left;    Allergies  No Known Allergies  Home Medications    Prior to Admission medications   Medication Sig Start Date End Date Taking? Authorizing Provider  acetaminophen (TYLENOL) 500 MG tablet Take 1,000 mg by mouth every 8 (eight) hours as needed for moderate pain.    [provider]  acidophilus (RISAQUAD) CAPS capsule Take 1 capsule by mouth daily.    [provider]  amoxicillin (AMOXIL) 500 MG tablet Take 2,000 mg by mouth See admin instructions. Take 2000 mg by mouth one hour prior to dental procedures 03/20/20   [provider]  aspirin 81 MG tablet Take 1 tablet (81 mg total) by mouth daily. 04/09/16   Jake Bathe, MD  Cholecalciferol (D3 SUPER STRENGTH PO) Take 1 tablet by mouth daily.    [provider]  nitroGLYCERIN (NITROSTAT) 0.4 MG SL tablet Place 1 tablet (0.4 mg total) under the tongue every 5 (five) minutes as needed. Max of 3 doses, then 911 12/16/21   Jake Bathe, MD  rosuvastatin (CRESTOR) 40 MG tablet Take 20 mg  by mouth daily.    [provider]  warfarin (COUMADIN) 5 MG tablet TAKE 2 TABLETS BY MOUTH DAILY OR AS INSTRUCTED BY THE ANTICOAGULATION CLINIC. Patient not taking: Reported on 10/02/2022 08/25/22   Jerline Pain, MD    Physical Exam    Vital Signs:  MILFERD ANSELL does not have vital signs available for review today.  Given telephonic nature of communication, physical exam is limited. AAOx3. NAD. Normal affect.  Speech and respirations are unlabored.  Accessory Clinical Findings    None  Assessment & Plan    1.  Preoperative Cardiovascular Risk Assessment:  Mr. Rafuse perioperative risk of a major cardiac event is 0.9% according to the Revised Cardiac Risk Index (RCRI).  Therefore, he is at low risk for perioperative complications.   His functional capacity is fair at 4.64 METs according to the [facility]  Activity Status Index (DASI). Recommendations: According to ACC/AHA guidelines, no further cardiovascular testing needed.  The patient may proceed to surgery at acceptable risk.   Antiplatelet and/or Anticoagulation Recommendations:  Coumadin can by held for 5 days prior to surgery.  Please resume post op when felt to be safe.  A copy of this note will be routed to requesting surgeon.  Time:   Today, I have spent 20 minutes with the patient with telehealth technology discussing medical history, symptoms, and management plan.     Elgie Collard, PA-C  10/03/2022, 7:56 AM

## 2022-10-06 ENCOUNTER — Encounter (HOSPITAL_COMMUNITY): Payer: Self-pay | Admitting: Orthopaedic Surgery

## 2022-10-06 NOTE — H&P (Signed)
TOTAL HIP ADMISSION H&P  Patient is admitted for right total hip arthroplasty.  Subjective:  Chief Complaint: right hip pain  HPI: Gabriel Anderson, 52 y.o. male, has a history of pain and functional disability in the right hip(s) due to arthritis and patient has failed non-surgical conservative treatments for greater than 12 weeks to include flexibility and strengthening excercises, use of assistive devices, weight reduction as appropriate, and activity modification.  Onset of symptoms was gradual starting 2 years ago with gradually worsening course since that time.The patient noted no past surgery on the right hip(s).  Patient currently rates pain in the right hip at 10 out of 10 with activity. Patient has night pain, worsening of pain with activity and weight bearing, pain that interfers with activities of daily living, and pain with passive range of motion. Patient has evidence of subchondral sclerosis, periarticular osteophytes, and joint space narrowing by imaging studies. This condition presents safety issues increasing the risk of falls.  There is no current active infection.  Patient Active Problem List   Diagnosis Date Noted   Paroxysmal atrial fibrillation ([place]) 01/09/2022   Encounter for therapeutic drug monitoring 09/27/2021   S/P AVR (aortic valve replacement) 09/16/2021   Ascending aortic aneurysm ([place]) 08/26/2021   DOE (dyspnea on exertion) 12/19/2020   Deficiency anemia 03/28/2020   Need for hepatitis C screening test 03/27/2020   Traumatic complete tear of right rotator cuff 12/19/2019   Unilateral primary osteoarthritis, right hip 11/17/2019   Hyperglycemia 11/01/2018   Routine general medical examination at a health care facility 11/01/2018   Colon cancer screening 11/01/2018   Vitamin D deficiency 11/01/2018   Morbid obesity ([place]) 10/26/2018   CAD S/P percutaneous coronary angioplasty 10/26/2018   Moderate to severe aortic stenosis 10/26/2018   Essential hypertension     Dyslipidemia, goal LDL below 70 06/02/2012   Coronary atherosclerosis of native coronary artery 05/21/2012   Past Medical History:  Diagnosis Date   Acute myocardial infarction, subendocardial infarction, initial episode of care [facility]) 2013   Cardiac catheterization 06/02/12-widely patent previously placed LAD stents. Troponin peaked 3. Normal EF.   Anemia    Arthritis    Ascending aortic aneurysm (HCC) 08/26/2021   4.4 cm on 05/16/21 CT   Coronary atherosclerosis of native coronary artery 05/21/2012   Cardiac catheterization 05/21/12-95% mid LAD lesion at the bifurcation of a large diagonal branch. Normal ejection fraction.   Dyspnea    Heart murmur    aortic stenosis- prior to valve replacement   Hyperlipidemia    Hypertension    Kawasaki syndrome ([place])    early 2000's per patient   Pneumonia    Raynaud's disease    Vasospasm ([place])     Past Surgical History:  Procedure Laterality Date    cardiacstent x 2  09/09/2011   AORTIC VALVE REPLACEMENT N/A 09/16/2021   Procedure: AORTIC VALVE REPLACEMENT (AVR) USING ON-X 23MM PROSTHETIC VALVE;  Surgeon: Gaye Pollack, MD;  Location: [facility] OR;  Service: Open Heart Surgery;  Laterality: N/A;  Circ Arrest   CARDIAC CATHETERIZATION  05/09/2012   PCI LAD at takeoff of large diagonal   ELBOW SURGERY Left 09/08/1982   LEFT HEART CATHETERIZATION WITH CORONARY ANGIOGRAM N/A 05/21/2012   Procedure: LEFT HEART CATHETERIZATION WITH CORONARY ANGIOGRAM;  Surgeon: Jettie Booze, MD;  Location: [facility] CATH LAB;  Service: Cardiovascular;  Laterality: N/A;   LEFT HEART CATHETERIZATION WITH CORONARY ANGIOGRAM N/A 06/02/2012   Procedure: LEFT HEART CATHETERIZATION WITH CORONARY ANGIOGRAM;  Surgeon: Elta Guadeloupe  Anne Fu, MD;  Location: [facility] CATH LAB;  Service: Cardiovascular;  Laterality: N/A;   PERCUTANEOUS CORONARY STENT INTERVENTION (PCI-S)  05/21/2012   Procedure: PERCUTANEOUS CORONARY STENT INTERVENTION (PCI-S);  Surgeon: Corky Crafts, MD;  Location: [facility] CATH  LAB;  Service: Cardiovascular;;   REPLACEMENT ASCENDING AORTA N/A 09/16/2021   Procedure: REPLACEMENT OF ASCENDING AORTA USING HEMASHIELD PLATINUM X10MM A21HY GRAFT;  Surgeon: Alleen Borne, MD;  Location: [facility] OR;  Service: Open Heart Surgery;  Laterality: N/A;   REVERSE SHOULDER ARTHROPLASTY Right 05/23/2020   Procedure: REVERSE SHOULDER ARTHROPLASTY;  Surgeon: Bjorn Pippin, MD;  Location: [facility] ORS;  Service: Orthopedics;  Laterality: Right;   RIGHT/LEFT HEART CATH AND CORONARY ANGIOGRAPHY N/A 12/19/2020   Procedure: RIGHT/LEFT HEART CATH AND CORONARY ANGIOGRAPHY;  Surgeon: Marykay Lex, MD;  Location: [facility] INVASIVE CV LAB;  Service: Cardiovascular;  Laterality: N/A;   ROTATOR CUFF REPAIR Right 09/09/2007   TEE WITHOUT CARDIOVERSION N/A 09/16/2021   Procedure: TRANSESOPHAGEAL ECHOCARDIOGRAM (TEE);  Surgeon: Alleen Borne, MD;  Location: [facility] OR;  Service: Open Heart Surgery;  Laterality: N/A;   TONSILLECTOMY     70s   TOTAL HIP ARTHROPLASTY Left 12/30/2013   Procedure: LEFT TOTAL HIP ARTHROPLASTY ANTERIOR APPROACH;  Surgeon: Kathryne Hitch, MD;  Location: [facility] ORS;  Service: Orthopedics;  Laterality: Left;    No current facility-administered medications for this encounter.   Current Outpatient Medications  Medication Sig Dispense Refill Last Dose   acetaminophen (TYLENOL) 500 MG tablet Take 1,000 mg by mouth every 8 (eight) hours as needed for moderate pain.      acidophilus (RISAQUAD) CAPS capsule Take 1 capsule by mouth daily.      amoxicillin (AMOXIL) 500 MG tablet Take 2,000 mg by mouth See admin instructions. Take 2000 mg by mouth one hour prior to dental procedures      aspirin 81 MG tablet Take 1 tablet (81 mg total) by mouth daily.      Cholecalciferol (D3 SUPER STRENGTH PO) Take 1 tablet by mouth daily.      nitroGLYCERIN (NITROSTAT) 0.4 MG SL tablet Place 1 tablet (0.4 mg total) under the tongue every 5 (five) minutes as needed. Max of 3 doses, then 911 25 tablet 6     rosuvastatin (CRESTOR) 40 MG tablet Take 20 mg by mouth daily.      warfarin (COUMADIN) 5 MG tablet TAKE 2 TABLETS BY MOUTH DAILY OR AS INSTRUCTED BY THE ANTICOAGULATION CLINIC. (Patient not taking: Reported on 10/02/2022) 195 tablet 1    No Known Allergies  Social History   Tobacco Use   Smoking status: Never   Smokeless tobacco: Never  Substance Use Topics   Alcohol use: Not Currently    Alcohol/week: 11.0 standard drinks of alcohol    Types: 5 Cans of beer, 2 Shots of liquor, 4 Standard drinks or equivalent per week    Comment: pt reports not drinking for months    Family History  Problem Relation Age of Onset   Hypertension Father    Diabetes Father    Heart failure Father    Heart disease Father    Stroke Sister    COPD Mother      Review of Systems  Objective:  Physical Exam Vitals reviewed.  Constitutional:      Appearance: Normal appearance. He is obese.  HENT:     Head: Normocephalic and atraumatic.  Eyes:     Extraocular Movements: Extraocular movements intact.     Pupils: Pupils are  equal, round, and reactive to light.  Cardiovascular:     Rate and Rhythm: Normal rate and regular rhythm.     Pulses: Normal pulses.  Pulmonary:     Breath sounds: Normal breath sounds.  Abdominal:     Palpations: Abdomen is soft.  Musculoskeletal:     Cervical back: Normal range of motion and neck supple.     Right hip: Tenderness and bony tenderness present. Decreased range of motion. Decreased strength.  Neurological:     Mental Status: He is alert and oriented to person, place, and time.  Psychiatric:        Behavior: Behavior normal.     Vital signs in last 24 hours:    Labs:   Estimated body mass index is 37.45 kg/m as calculated from the following:   Height as of 10/01/22: 5\' 10"  (1.778 m).   Weight as of 10/01/22: 118.4 kg.   Imaging Review Plain radiographs demonstrate severe degenerative joint disease of the right hip(s). The bone quality appears to be  excellent for age and reported activity level.      Assessment/Plan:  End stage arthritis, right hip(s)  The patient history, physical examination, clinical judgement of the provider and imaging studies are consistent with end stage degenerative joint disease of the right hip(s) and total hip arthroplasty is deemed medically necessary. The treatment options including medical management, injection therapy, arthroscopy and arthroplasty were discussed at length. The risks and benefits of total hip arthroplasty were presented and reviewed. The risks due to aseptic loosening, infection, stiffness, dislocation/subluxation,  thromboembolic complications and other imponderables were discussed.  The patient acknowledged the explanation, agreed to proceed with the plan and consent was signed. Patient is being admitted for inpatient treatment for surgery, pain control, PT, OT, prophylactic antibiotics, VTE prophylaxis, progressive ambulation and ADL's and discharge planning.The patient is planning to be discharged home with home health services

## 2022-10-07 ENCOUNTER — Ambulatory Visit (HOSPITAL_COMMUNITY): Payer: No Typology Code available for payment source | Admitting: Physician Assistant

## 2022-10-07 ENCOUNTER — Encounter (HOSPITAL_COMMUNITY)
Admission: RE | Disposition: A | Discharge status: Home Health | Payer: Self-pay | Source: Home / Self Care | Attending: Orthopaedic Surgery

## 2022-10-07 ENCOUNTER — Observation Stay (HOSPITAL_COMMUNITY)
Admission: RE | Admit: 2022-10-07 | Discharge: 2022-10-08 | Disposition: A | Discharge status: Home Health | Payer: No Typology Code available for payment source | Attending: Orthopaedic Surgery | Admitting: Orthopaedic Surgery

## 2022-10-07 ENCOUNTER — Observation Stay (HOSPITAL_COMMUNITY): Payer: No Typology Code available for payment source

## 2022-10-07 ENCOUNTER — Ambulatory Visit (HOSPITAL_COMMUNITY): Payer: No Typology Code available for payment source

## 2022-10-07 ENCOUNTER — Encounter (HOSPITAL_COMMUNITY): Payer: Self-pay | Admitting: Orthopaedic Surgery

## 2022-10-07 ENCOUNTER — Other Ambulatory Visit: Payer: Self-pay

## 2022-10-07 DIAGNOSIS — Z955 Presence of coronary angioplasty implant and graft: Secondary | ICD-10-CM | POA: Insufficient documentation

## 2022-10-07 DIAGNOSIS — I252 Old myocardial infarction: Secondary | ICD-10-CM

## 2022-10-07 DIAGNOSIS — Z7982 Long term (current) use of aspirin: Secondary | ICD-10-CM | POA: Diagnosis not present

## 2022-10-07 DIAGNOSIS — I10 Essential (primary) hypertension: Secondary | ICD-10-CM | POA: Diagnosis not present

## 2022-10-07 DIAGNOSIS — M1611 Unilateral primary osteoarthritis, right hip: Secondary | ICD-10-CM | POA: Diagnosis not present

## 2022-10-07 DIAGNOSIS — I2510 Atherosclerotic heart disease of native coronary artery without angina pectoris: Secondary | ICD-10-CM | POA: Diagnosis not present

## 2022-10-07 DIAGNOSIS — Z96641 Presence of right artificial hip joint: Secondary | ICD-10-CM

## 2022-10-07 DIAGNOSIS — Z952 Presence of prosthetic heart valve: Secondary | ICD-10-CM | POA: Diagnosis not present

## 2022-10-07 DIAGNOSIS — Z01818 Encounter for other preprocedural examination: Secondary | ICD-10-CM

## 2022-10-07 DIAGNOSIS — Z96643 Presence of artificial hip joint, bilateral: Secondary | ICD-10-CM | POA: Insufficient documentation

## 2022-10-07 DIAGNOSIS — I480 Paroxysmal atrial fibrillation: Secondary | ICD-10-CM | POA: Diagnosis not present

## 2022-10-07 DIAGNOSIS — M169 Osteoarthritis of hip, unspecified: Secondary | ICD-10-CM | POA: Diagnosis present

## 2022-10-07 DIAGNOSIS — Z7901 Long term (current) use of anticoagulants: Secondary | ICD-10-CM | POA: Insufficient documentation

## 2022-10-07 HISTORY — PX: TOTAL HIP ARTHROPLASTY: SHX124

## 2022-10-07 LAB — PROTIME-INR
INR: 1 (ref 0.8–1.2)
Prothrombin Time: 13.4 seconds (ref 11.4–15.2)

## 2022-10-07 LAB — APTT: aPTT: 29 seconds (ref 24–36)

## 2022-10-07 SURGERY — ARTHROPLASTY, HIP, TOTAL, ANTERIOR APPROACH
Anesthesia: Monitor Anesthesia Care | Site: Hip | Laterality: Right

## 2022-10-07 MED ORDER — OXYCODONE HCL 5 MG PO TABS
ORAL_TABLET | ORAL | Status: AC
  Filled 2022-10-07: qty 1

## 2022-10-07 MED ORDER — PROPOFOL 1000 MG/100ML IV EMUL
INTRAVENOUS | Status: AC
  Filled 2022-10-07: qty 300

## 2022-10-07 MED ORDER — PHENYLEPHRINE HCL-NACL 20-0.9 MG/250ML-% IV SOLN
INTRAVENOUS | Status: AC
  Filled 2022-10-07: qty 250

## 2022-10-07 MED ORDER — LIDOCAINE 2% (20 MG/ML) 5 ML SYRINGE
INTRAMUSCULAR | Status: AC
  Filled 2022-10-07: qty 5

## 2022-10-07 MED ORDER — EPHEDRINE SULFATE-NACL 50-0.9 MG/10ML-% IV SOSY
PREFILLED_SYRINGE | INTRAVENOUS | Status: DC | PRN
  Administered 2022-10-07: 10 mg via INTRAVENOUS

## 2022-10-07 MED ORDER — POLYETHYLENE GLYCOL 3350 17 G PO PACK: 17.0000 g | PACK | Freq: Every day | ORAL | Status: DC | PRN

## 2022-10-07 MED ORDER — LACTATED RINGERS IV SOLN: INTRAVENOUS | Status: DC

## 2022-10-07 MED ORDER — BUPIVACAINE IN DEXTROSE 0.75-8.25 % IT SOLN
INTRATHECAL | Status: DC | PRN
  Administered 2022-10-07: 2 mL via INTRATHECAL

## 2022-10-07 MED ORDER — CHLORHEXIDINE GLUCONATE 0.12 % MT SOLN
15.0000 mL | Freq: Once | OROMUCOSAL | Status: AC
  Administered 2022-10-07: 15 mL via OROMUCOSAL
  Filled 2022-10-07: qty 15

## 2022-10-07 MED ORDER — 0.9 % SODIUM CHLORIDE (POUR BTL) OPTIME
TOPICAL | Status: DC | PRN
  Administered 2022-10-07: 1000 mL

## 2022-10-07 MED ORDER — OXYCODONE HCL 5 MG PO TABS
5.0000 mg | ORAL_TABLET | Freq: Once | ORAL | Status: AC | PRN
  Administered 2022-10-07: 5 mg via ORAL

## 2022-10-07 MED ORDER — DIPHENHYDRAMINE HCL 12.5 MG/5ML PO ELIX: 12.5000 mg | ORAL_SOLUTION | ORAL | Status: DC | PRN

## 2022-10-07 MED ORDER — ONDANSETRON HCL 4 MG/2ML IJ SOLN: 4.0000 mg | Freq: Four times a day (QID) | INTRAMUSCULAR | Status: DC | PRN

## 2022-10-07 MED ORDER — SODIUM CHLORIDE 0.9 % IR SOLN
Status: DC | PRN
  Administered 2022-10-07: 1000 mL

## 2022-10-07 MED ORDER — METHOCARBAMOL 500 MG PO TABS
ORAL_TABLET | ORAL | Status: AC
  Filled 2022-10-07: qty 1

## 2022-10-07 MED ORDER — FENTANYL CITRATE (PF) 250 MCG/5ML IJ SOLN
INTRAMUSCULAR | Status: DC | PRN
  Administered 2022-10-07: 50 ug via INTRAVENOUS

## 2022-10-07 MED ORDER — OXYCODONE HCL 5 MG/5ML PO SOLN: 5.0000 mg | Freq: Once | ORAL | Status: AC | PRN

## 2022-10-07 MED ORDER — GABAPENTIN 100 MG PO CAPS
100.0000 mg | ORAL_CAPSULE | Freq: Three times a day (TID) | ORAL | Status: DC
  Administered 2022-10-07 – 2022-10-08 (×3): 100 mg via ORAL
  Filled 2022-10-07 (×3): qty 1

## 2022-10-07 MED ORDER — ALUM & MAG HYDROXIDE-SIMETH 200-200-20 MG/5ML PO SUSP: 30.0000 mL | ORAL | Status: DC | PRN

## 2022-10-07 MED ORDER — PHENYLEPHRINE HCL-NACL 20-0.9 MG/250ML-% IV SOLN
INTRAVENOUS | Status: AC
  Filled 2022-10-07: qty 750

## 2022-10-07 MED ORDER — METOCLOPRAMIDE HCL 5 MG/ML IJ SOLN: 5.0000 mg | Freq: Three times a day (TID) | INTRAMUSCULAR | Status: DC | PRN

## 2022-10-07 MED ORDER — WARFARIN SODIUM 5 MG PO TABS: 10.0000 mg | ORAL_TABLET | Freq: Once | ORAL | Status: DC

## 2022-10-07 MED ORDER — HYDROMORPHONE HCL 1 MG/ML IJ SOLN: 0.2500 mg | INTRAMUSCULAR | Status: DC | PRN

## 2022-10-07 MED ORDER — DEXAMETHASONE SODIUM PHOSPHATE 10 MG/ML IJ SOLN
INTRAMUSCULAR | Status: AC
  Filled 2022-10-07: qty 1

## 2022-10-07 MED ORDER — ONDANSETRON HCL 4 MG/2ML IJ SOLN: 4.0000 mg | Freq: Once | INTRAMUSCULAR | Status: DC | PRN

## 2022-10-07 MED ORDER — ORAL CARE MOUTH RINSE: 15.0000 mL | Freq: Once | OROMUCOSAL | Status: AC

## 2022-10-07 MED ORDER — PANTOPRAZOLE SODIUM 40 MG PO TBEC
40.0000 mg | DELAYED_RELEASE_TABLET | Freq: Every day | ORAL | Status: DC
  Administered 2022-10-07 – 2022-10-08 (×2): 40 mg via ORAL
  Filled 2022-10-07 (×2): qty 1

## 2022-10-07 MED ORDER — ROCURONIUM BROMIDE 10 MG/ML (PF) SYRINGE
PREFILLED_SYRINGE | INTRAVENOUS | Status: AC
  Filled 2022-10-07: qty 10

## 2022-10-07 MED ORDER — PHENOL 1.4 % MT LIQD: 1.0000 | OROMUCOSAL | Status: DC | PRN

## 2022-10-07 MED ORDER — HYDROMORPHONE HCL 1 MG/ML IJ SOLN
0.5000 mg | INTRAMUSCULAR | Status: DC | PRN
  Administered 2022-10-07 – 2022-10-08 (×5): 1 mg via INTRAVENOUS
  Filled 2022-10-07 (×6): qty 1

## 2022-10-07 MED ORDER — ACETAMINOPHEN 325 MG PO TABS: 325.0000 mg | ORAL_TABLET | Freq: Four times a day (QID) | ORAL | Status: DC | PRN

## 2022-10-07 MED ORDER — ONDANSETRON HCL 4 MG/2ML IJ SOLN
INTRAMUSCULAR | Status: DC | PRN
  Administered 2022-10-07: 4 mg via INTRAVENOUS

## 2022-10-07 MED ORDER — SODIUM CHLORIDE 0.9 % IV SOLN: INTRAVENOUS | Status: DC

## 2022-10-07 MED ORDER — PROPOFOL 10 MG/ML IV BOLUS
INTRAVENOUS | Status: AC
  Filled 2022-10-07: qty 20

## 2022-10-07 MED ORDER — METOCLOPRAMIDE HCL 5 MG PO TABS
5.0000 mg | ORAL_TABLET | Freq: Three times a day (TID) | ORAL | Status: DC | PRN
  Filled 2022-10-07: qty 2

## 2022-10-07 MED ORDER — MENTHOL 3 MG MT LOZG: 1.0000 | LOZENGE | OROMUCOSAL | Status: DC | PRN

## 2022-10-07 MED ORDER — MIDAZOLAM HCL 2 MG/2ML IJ SOLN
INTRAMUSCULAR | Status: AC
  Filled 2022-10-07: qty 2

## 2022-10-07 MED ORDER — METHOCARBAMOL 500 MG PO TABS
500.0000 mg | ORAL_TABLET | Freq: Four times a day (QID) | ORAL | Status: DC | PRN
  Administered 2022-10-07 – 2022-10-08 (×3): 500 mg via ORAL
  Filled 2022-10-07 (×2): qty 1

## 2022-10-07 MED ORDER — ONDANSETRON HCL 4 MG PO TABS
4.0000 mg | ORAL_TABLET | Freq: Four times a day (QID) | ORAL | Status: DC | PRN
  Filled 2022-10-07: qty 1

## 2022-10-07 MED ORDER — PHENYLEPHRINE HCL-NACL 20-0.9 MG/250ML-% IV SOLN
INTRAVENOUS | Status: DC | PRN
  Administered 2022-10-07: 50 ug/min via INTRAVENOUS

## 2022-10-07 MED ORDER — ONDANSETRON HCL 4 MG/2ML IJ SOLN
INTRAMUSCULAR | Status: AC
  Filled 2022-10-07: qty 2

## 2022-10-07 MED ORDER — ROSUVASTATIN CALCIUM 20 MG PO TABS
20.0000 mg | ORAL_TABLET | Freq: Every day | ORAL | Status: DC
  Administered 2022-10-08: 20 mg via ORAL
  Filled 2022-10-07: qty 1

## 2022-10-07 MED ORDER — PROPOFOL 500 MG/50ML IV EMUL
INTRAVENOUS | Status: DC | PRN
  Administered 2022-10-07: 125 ug/kg/min via INTRAVENOUS
  Administered 2022-10-07: 30 ug via INTRAVENOUS

## 2022-10-07 MED ORDER — RISAQUAD PO CAPS
1.0000 | ORAL_CAPSULE | Freq: Every day | ORAL | Status: DC
  Administered 2022-10-07 – 2022-10-08 (×2): 1 via ORAL
  Filled 2022-10-07 (×2): qty 1

## 2022-10-07 MED ORDER — CEFAZOLIN SODIUM-DEXTROSE 1-4 GM/50ML-% IV SOLN
1.0000 g | Freq: Four times a day (QID) | INTRAVENOUS | Status: AC
  Administered 2022-10-07 (×2): 1 g via INTRAVENOUS
  Filled 2022-10-07 (×2): qty 50

## 2022-10-07 MED ORDER — HYDROMORPHONE HCL 2 MG PO TABS: 2.0000 mg | ORAL_TABLET | ORAL | Status: DC | PRN

## 2022-10-07 MED ORDER — ASPIRIN 81 MG PO TBEC
81.0000 mg | DELAYED_RELEASE_TABLET | Freq: Every day | ORAL | Status: DC
  Administered 2022-10-08: 81 mg via ORAL
  Filled 2022-10-07: qty 1

## 2022-10-07 MED ORDER — TRANEXAMIC ACID-NACL 1000-0.7 MG/100ML-% IV SOLN
1000.0000 mg | INTRAVENOUS | Status: AC
  Administered 2022-10-07: 1000 mg via INTRAVENOUS
  Filled 2022-10-07: qty 100

## 2022-10-07 MED ORDER — OXYCODONE HCL 5 MG PO TABS
5.0000 mg | ORAL_TABLET | ORAL | Status: DC | PRN
  Administered 2022-10-07 – 2022-10-08 (×4): 10 mg via ORAL
  Filled 2022-10-07 (×4): qty 2

## 2022-10-07 MED ORDER — DOCUSATE SODIUM 100 MG PO CAPS
100.0000 mg | ORAL_CAPSULE | Freq: Two times a day (BID) | ORAL | Status: DC
  Administered 2022-10-07 – 2022-10-08 (×3): 100 mg via ORAL
  Filled 2022-10-07 (×3): qty 1

## 2022-10-07 MED ORDER — FENTANYL CITRATE (PF) 250 MCG/5ML IJ SOLN
INTRAMUSCULAR | Status: AC
  Filled 2022-10-07: qty 5

## 2022-10-07 MED ORDER — OXYCODONE HCL 5 MG PO TABS
5.0000 mg | ORAL_TABLET | Freq: Once | ORAL | Status: AC
  Administered 2022-10-07: 5 mg via ORAL

## 2022-10-07 MED ORDER — POVIDONE-IODINE 10 % EX SWAB
2.0000 | Freq: Once | CUTANEOUS | Status: AC
  Administered 2022-10-07: 2 via TOPICAL

## 2022-10-07 MED ORDER — CEFAZOLIN SODIUM-DEXTROSE 2-4 GM/100ML-% IV SOLN
2.0000 g | INTRAVENOUS | Status: AC
  Administered 2022-10-07: 2 g via INTRAVENOUS
  Filled 2022-10-07: qty 100

## 2022-10-07 MED ORDER — PROPOFOL 1000 MG/100ML IV EMUL
INTRAVENOUS | Status: AC
  Filled 2022-10-07: qty 200

## 2022-10-07 MED ORDER — MIDAZOLAM HCL 2 MG/2ML IJ SOLN
INTRAMUSCULAR | Status: DC | PRN
  Administered 2022-10-07: 2 mg via INTRAVENOUS

## 2022-10-07 MED ORDER — DEXAMETHASONE SODIUM PHOSPHATE 10 MG/ML IJ SOLN
INTRAMUSCULAR | Status: DC | PRN
  Administered 2022-10-07: 5 mg via INTRAVENOUS

## 2022-10-07 MED ORDER — METHOCARBAMOL 1000 MG/10ML IJ SOLN
500.0000 mg | Freq: Four times a day (QID) | INTRAVENOUS | Status: DC | PRN
  Filled 2022-10-07: qty 5

## 2022-10-07 SURGICAL SUPPLY — 57 items
ACETAB CUP W/GRIPTION 54 (Plate) ×1 IMPLANT
APL SKNCLS STERI-STRIP NONHPOA (GAUZE/BANDAGES/DRESSINGS) ×1
BAG COUNTER SPONGE SURGICOUNT (BAG) ×1 IMPLANT
BAG SPNG CNTER NS LX DISP (BAG) ×1
BENZOIN TINCTURE PRP APPL 2/3 (GAUZE/BANDAGES/DRESSINGS) ×1 IMPLANT
BLADE CLIPPER SURG (BLADE) IMPLANT
BLADE SAW SGTL 18X1.27X75 (BLADE) ×1 IMPLANT
COVER SURGICAL LIGHT HANDLE (MISCELLANEOUS) ×1 IMPLANT
CUP ACETAB W/GRIPTION 54 (Plate) IMPLANT
DRAPE C-ARM 42X72 X-RAY (DRAPES) ×1 IMPLANT
DRAPE STERI IOBAN 125X83 (DRAPES) ×1 IMPLANT
DRAPE U-SHAPE 47X51 STRL (DRAPES) ×3 IMPLANT
DRSG AQUACEL AG ADV 3.5X10 (GAUZE/BANDAGES/DRESSINGS) ×1 IMPLANT
DRSG XEROFORM 1X8 (GAUZE/BANDAGES/DRESSINGS) IMPLANT
DURAPREP 26ML APPLICATOR (WOUND CARE) ×1 IMPLANT
ELECT BLADE 4.0 EZ CLEAN MEGAD (MISCELLANEOUS) ×1
ELECT BLADE 6.5 EXT (BLADE) IMPLANT
ELECT REM PT RETURN 9FT ADLT (ELECTROSURGICAL) ×1
ELECTRODE BLDE 4.0 EZ CLN MEGD (MISCELLANEOUS) ×1 IMPLANT
ELECTRODE REM PT RTRN 9FT ADLT (ELECTROSURGICAL) ×1 IMPLANT
FACESHIELD WRAPAROUND (MASK) ×2 IMPLANT
FACESHIELD WRAPAROUND OR TEAM (MASK) ×2 IMPLANT
FEM STEM 12/14 TAPER SZ 4 HIP (Orthopedic Implant) ×1 IMPLANT
FEMORAL STEM 12/14 TPR SZ4 HIP (Orthopedic Implant) IMPLANT
GLOVE BIOGEL PI IND STRL 8 (GLOVE) ×2 IMPLANT
GLOVE ECLIPSE 8.0 STRL XLNG CF (GLOVE) ×1 IMPLANT
GLOVE ORTHO TXT STRL SZ7.5 (GLOVE) ×2 IMPLANT
GOWN STRL REUS W/ TWL LRG LVL3 (GOWN DISPOSABLE) ×2 IMPLANT
GOWN STRL REUS W/ TWL XL LVL3 (GOWN DISPOSABLE) ×2 IMPLANT
GOWN STRL REUS W/TWL LRG LVL3 (GOWN DISPOSABLE) ×2
GOWN STRL REUS W/TWL XL LVL3 (GOWN DISPOSABLE) ×2
HANDPIECE INTERPULSE COAX TIP (DISPOSABLE) ×1
HEAD CERAMIC DELTA 36 PLUS 1.5 (Hips) IMPLANT
KIT BASIN OR (CUSTOM PROCEDURE TRAY) ×1 IMPLANT
KIT TURNOVER KIT B (KITS) ×1 IMPLANT
LINER NEUTRAL 54X36MM PLUS 4 (Hips) IMPLANT
MANIFOLD NEPTUNE II (INSTRUMENTS) ×1 IMPLANT
NS IRRIG 1000ML POUR BTL (IV SOLUTION) ×1 IMPLANT
PACK TOTAL JOINT (CUSTOM PROCEDURE TRAY) ×1 IMPLANT
PAD ARMBOARD 7.5X6 YLW CONV (MISCELLANEOUS) ×1 IMPLANT
SET HNDPC FAN SPRY TIP SCT (DISPOSABLE) ×1 IMPLANT
STAPLER VISISTAT 35W (STAPLE) IMPLANT
STRIP CLOSURE SKIN 1/2X4 (GAUZE/BANDAGES/DRESSINGS) ×2 IMPLANT
SUT ETHIBOND NAB CT1 #1 30IN (SUTURE) ×1 IMPLANT
SUT MNCRL AB 4-0 PS2 18 (SUTURE) IMPLANT
SUT VIC AB 0 CT1 27 (SUTURE) ×1
SUT VIC AB 0 CT1 27XBRD ANBCTR (SUTURE) ×1 IMPLANT
SUT VIC AB 1 CT1 27 (SUTURE) ×1
SUT VIC AB 1 CT1 27XBRD ANBCTR (SUTURE) ×1 IMPLANT
SUT VIC AB 2-0 CT1 27 (SUTURE) ×1
SUT VIC AB 2-0 CT1 TAPERPNT 27 (SUTURE) ×1 IMPLANT
TOWEL GREEN STERILE (TOWEL DISPOSABLE) ×1 IMPLANT
TOWEL GREEN STERILE FF (TOWEL DISPOSABLE) ×1 IMPLANT
TRAY CATH 16FR W/PLASTIC CATH (SET/KITS/TRAYS/PACK) IMPLANT
TRAY FOLEY W/BAG SLVR 16FR (SET/KITS/TRAYS/PACK)
TRAY FOLEY W/BAG SLVR 16FR ST (SET/KITS/TRAYS/PACK) IMPLANT
WATER STERILE IRR 1000ML POUR (IV SOLUTION) ×2 IMPLANT

## 2022-10-07 NOTE — Op Note (Signed)
Operative Note  Date of operation: 10/07/2022 Preoperative diagnosis: Right hip primary osteoarthritis Postoperative diagnosis: Same  Procedure: Right direct anterior total hip arthroplasty  Implants: DePuy sector GRIPTION acetabular component size 54, 36+4 polythene liner, size 4 Actis femoral component with high offset, 36+1.5 ceramic head ball  Surgeon: Lind Guest. [name], MD Assistant: Benita Stabile, PA-C  Anesthesia: Spinal Antibiotics: IV Ancef EBL: 998 cc Complications: None  Indications: Gabriel Anderson is a very pleasant and active 52 year old gentleman who has well-documented building arthritis involving his right hip that is seen on clinical exam and x-ray findings.  He actually has a history of a left total hip arthroplasty done between 8 and 9 years ago.  That hip is done well.  At this point his right hip pain is daily and is detrimentally affecting his mobility, his quality of life, and his activity living to the point he wished to proceed with a hip replacement on the right side.  Having had this done before he is fully aware of the risk of acute blood loss anemia, nerve or vessel injury, fracture, infection, implant failure, dislocation, DVT, likely differences and wound healing issues.  He understands her goals are hopefully decrease pain, improve mobility, and improve quality of life.  Procedure description: After informed consent was obtained and the appropriate right hip was marked, the patient was brought to the operating room and set up on the stretcher where spinal anesthesia was obtained.  He was then laid in supine position on stretcher and a Foley catheter was placed.  Traction boots were placed on both his feet and he was placed supine on the Hana fracture table with both legs and inline skeletal traction devices but no traction applied.  His right operative hip and pelvis were assessed radiographically.  We then prepped the right hip with DuraPrep and sterile drapes.  A  timeout was called and he was identified as the correct patient the correct right hip.  An incision was then made just inferior and posterior to the ASIS and carried slightly obliquely down the leg.  Dissection was carried down to the tensor fascia lata muscle and the tensor fascia was then divided longitudinally to proceed with direct interposed the hip.  Circumflex vessels were identified and cauterized.  The hip capsule identified and opened up in L-type format finding a moderate joint effusion and significant arthritis around the right femoral head neck.  A femoral neck cut was then made with an oscillating saw just proximal to the lesser trochanter and completed with an osteotome.  A corkscrew guide was placed in the femoral head and the femoral head was removed in its entirety finding a wide area devoid of cartilage.  A bent Hohmann was placed over the medial acetabular rim and remnants of the acetabular labrum and other debris were removed.  Reaming was then initiated from a size 43 reamer and stepwise increments going up to a size 53 reamer with all reamers placed under direct visualization and the last reamer was placed under direct fluoroscopy in order to obtain the depth of reaming, the inclination and anteversion.  The real DePuy sector GRIPTION acetabular component was then placed without difficulty size 54 and a 36+4 polythene liner was placed within that size acetabular component.  She was then turned to the femur.  With the right leg externally rotated to 120 degrees, extended and adducted, and Mueller retractor was placed by the medial calcar and a Hohmann retractors were placed behind the greater trochanter.  The lateral  joint capsule was released and a box cutting osteotome was used to enter the femoral canal.  Broaching was then initiated using the Actis broaching system from a size 0 going to a size 4.  With a size 4 in place we trialed a high offset femoral neck and a 36+1.5 trial hip ball.   This was reduced into the pelvis and we are pleased with leg length, range of motion, offset and stability assessed both radiographically and mechanically.  The hip was then dislocated and we remove the trial components.  We then placed the real Actis femoral component with high offset size 4 and the real 36+1.5 ceramic hip bone again reduces in acetabulum we are pleased with stability, range of motion, offset and leg length assessed again both mechanically and radiographically.  We then irrigated the soft tissue normal saline solution.  The joint capsule remnants were closed with interrupted #1 Ethibond suture followed by #1 Vicryl to close the tensor fascia.  0 Vicryl was used to close the deep tissue and 2-0 Vicryl was used to close the subcutaneous tissue.  The skin was closed with staples.  Well-padded sterile dressing was applied.  The patient was taken off the Hana table and taken to recovery room in stable condition.  All final counts were correct and no complications noted.  Benita Stabile, PA-C did assist in entire case and beginning to end and his assistance was crucial and medically necessary for soft tissue retraction and management, helping guide implant placement and a layered closure of the wound.

## 2022-10-07 NOTE — Anesthesia Procedure Notes (Signed)
Spinal  Patient location during procedure: OR Start time: 10/07/2022 7:47 AM End time: 10/07/2022 7:50 AM Reason for block: surgical anesthesia Staffing Performed: anesthesiologist  Anesthesiologist: Josephine Igo, MD Performed by: Josephine Igo, MD Authorized by: Josephine Igo, MD   Preanesthetic Checklist Completed: patient identified, IV checked, site marked, risks and benefits discussed, surgical consent, monitors and equipment checked, pre-op evaluation and timeout performed Spinal Block Patient position: sitting Prep: DuraPrep and site prepped and draped Patient monitoring: heart rate, cardiac monitor, continuous pulse ox and blood pressure Approach: midline Location: L3-4 Injection technique: single-shot Needle Needle type: Pencan  Needle gauge: 24 G Needle length: 9 cm Assessment Sensory level: T4 Events: CSF return Additional Notes Patient tolerated procedure well. Adequate sensory level.

## 2022-10-07 NOTE — Transfer of Care (Addendum)
Immediate Anesthesia Transfer of Care Note  Patient: Gabriel Anderson  Procedure(s) Performed: RIGHT TOTAL HIP ARTHROPLASTY (Right: Hip)  Patient Location: PACU  Anesthesia Type:MAC and Spinal  Level of Consciousness: awake and patient cooperative  Airway & Oxygen Therapy: Patient Spontanous Breathing and Patient connected to face mask oxygen  Post-op Assessment: Report given to RN, Post -op Vital signs reviewed and stable, and Patient moving all extremities  Post vital signs: Reviewed and stable  Last Vitals:  Vitals Value Taken Time  BP 85/51 10/07/22 0925  Temp    Pulse 68 10/07/22 0925  Resp 13 10/07/22 0925  SpO2 98 % 10/07/22 0925  Vitals shown include unvalidated device data.  Last Pain:  Vitals:   10/07/22 0611  TempSrc:   PainSc: 0-No pain         Complications: No notable events documented.

## 2022-10-07 NOTE — Evaluation (Signed)
Physical Therapy Evaluation Patient Details Name: Gabriel Anderson MRN: [id number] DOB: 1971/07/27 Today's Date: 10/07/2022  History of Present Illness  52 y.o. male presents to [facility] hospital on 10/07/2022 for elective R THA. PMH includes PAF, ascending aortic aneurysm, DOE, CAD, HTN, HLD, Kawasaki syndrome, MI.  Clinical Impression  Pt presents to PT with deficits in strength, power, endurance, gait. Pt reports improved comfort post-surgery compared to pre-surgery, tolerating increased weight through R hip and able to ambulate with increased knee flexion through stance phase per pt report. Pt is mobilizing very well. PT will follow tomorrow for stair training.       Recommendations for follow up therapy are one component of a multi-disciplinary discharge planning process, led by the attending physician.  Recommendations may be updated based on patient status, additional functional criteria and insurance authorization.  Follow Up Recommendations Follow physician's recommendations for discharge plan and follow up therapies      Assistance Recommended at Discharge PRN  Patient can return home with the following  A little help with bathing/dressing/bathroom;Assistance with cooking/housework;Assist for transportation;Help with stairs or ramp for entrance    Equipment Recommendations Rolling walker (2 wheels);BSC/3in1  Recommendations for Other Services       Functional Status Assessment Patient has had a recent decline in their functional status and demonstrates the ability to make significant improvements in function in a reasonable and predictable amount of time.     Precautions / Restrictions Precautions Precautions: Fall;Other (comment) (direct anterior approach) Precaution Booklet Issued: No Restrictions Weight Bearing Restrictions: Yes RLE Weight Bearing: Weight bearing as tolerated      Mobility  Bed Mobility Overal bed mobility: Needs Assistance Bed Mobility: Supine to Sit,  Sit to Supine     Supine to sit: Supervision Sit to supine: Supervision   General bed mobility comments: use of rails in supine to sit    Transfers Overall transfer level: Needs assistance Equipment used: Rolling walker (2 wheels) Transfers: Sit to/from Stand Sit to Stand: Min guard                Ambulation/Gait Ambulation/Gait assistance: Supervision Gait Distance (Feet): 300 Feet Assistive device: Rolling walker (2 wheels) Gait Pattern/deviations: Step-through pattern Gait velocity: reduced Gait velocity interpretation: <1.8 ft/sec, indicate of risk for recurrent falls   General Gait Details: slowed step-through gait  Stairs            Wheelchair Mobility    Modified Rankin (Stroke Patients Only)       Balance Overall balance assessment: Needs assistance Sitting-balance support: No upper extremity supported, Feet supported Sitting balance-Leahy Scale: Good     Standing balance support: Single extremity supported, Reliant on assistive device for balance Standing balance-Leahy Scale: Poor                               Pertinent Vitals/Pain Pain Assessment Pain Assessment: 0-10 Pain Score: 7  Pain Location: R hip Pain Descriptors / Indicators: Sore Pain Intervention(s): Monitored during session    Home Living Family/patient expects to be discharged to:: Private residence Living Arrangements: Spouse/significant other;Children Available Help at Discharge: Family;Available PRN/intermittently Type of Home: House Home Access: Stairs to enter Entrance Stairs-Rails: None Entrance Stairs-Number of Steps: 3   Home Layout: Other (Comment) (split level) Home Equipment: Cane - single point      Prior Function Prior Level of Function : Independent/Modified Independent;Driving  Mobility Comments: ambulatory with SPC       Hand Dominance        Extremity/Trunk Assessment   Upper Extremity Assessment Upper  Extremity Assessment: Overall WFL for tasks assessed    Lower Extremity Assessment Lower Extremity Assessment: RLE deficits/detail RLE Deficits / Details: generalized weakness, ROM WFL    Cervical / Trunk Assessment Cervical / Trunk Assessment: Normal  Communication   Communication: No difficulties  Cognition Arousal/Alertness: Awake/alert Behavior During Therapy: WFL for tasks assessed/performed Overall Cognitive Status: Within Functional Limits for tasks assessed                                          General Comments General comments (skin integrity, edema, etc.): VSS on RA    Exercises     Assessment/Plan    PT Assessment Patient needs continued PT services  PT Problem List Decreased strength;Decreased activity tolerance;Decreased balance;Decreased mobility;Decreased knowledge of use of DME;Pain       PT Treatment Interventions DME instruction;Gait training;Functional mobility training;Therapeutic activities;Balance training;Neuromuscular re-education;Therapeutic exercise;Patient/family education;Stair training    PT Goals (Current goals can be found in the Care Plan section)  Acute Rehab PT Goals Patient Stated Goal: to go home PT Goal Formulation: With patient Time For Goal Achievement: 10/11/22 Potential to Achieve Goals: Good    Frequency 7X/week     Co-evaluation               AM-PAC PT "6 Clicks" Mobility  Outcome Measure Help needed turning from your back to your side while in a flat bed without using bedrails?: A Little Help needed moving from lying on your back to sitting on the side of a flat bed without using bedrails?: A Little Help needed moving to and from a bed to a chair (including a wheelchair)?: A Little Help needed standing up from a chair using your arms (e.g., wheelchair or bedside chair)?: A Little Help needed to walk in hospital room?: A Little Help needed climbing 3-5 steps with a railing? : A Little 6 Click  Score: 18    End of Session   Activity Tolerance: Patient tolerated treatment well Patient left: in bed;with call bell/phone within reach;with family/visitor present Nurse Communication: Mobility status PT Visit Diagnosis: Other abnormalities of gait and mobility (R26.89);Muscle weakness (generalized) (M62.81);Pain Pain - Right/Left: Right Pain - part of body: Hip    Time: 1204-1224 PT Time Calculation (min) (ACUTE ONLY): 20 min   Charges:   PT Evaluation $PT Eval Low Complexity: 1 Low          Zenaida Niece, PT, DPT Acute Rehabilitation Office [phone]   Zenaida Niece 10/07/2022, 1:05 PM

## 2022-10-07 NOTE — Interval H&P Note (Signed)
History and Physical Interval Note: The patient understands that he is here for a right total hip replacement to treat his severe right hip arthritis.  There is been no acute or interval changes in medical status.  See recent H&P.  The risks and benefits of surgery been explained in detail and informed consent was obtained.  The  upper right right operative hip has been marked.  10/07/2022 7:28 AM  Gabriel Anderson  has presented today for surgery, with the diagnosis of osteoarthritis right hip.  The various methods of treatment have been discussed with the patient and family. After consideration of risks, benefits and other options for treatment, the patient has consented to  Procedure(s): RIGHT TOTAL HIP ARTHROPLASTY ANTERIOR APPROACH (Right) as a surgical intervention.  The patient's history has been reviewed, patient examined, no change in status, stable for surgery.  I have reviewed the patient's chart and labs.  Questions were answered to the patient's satisfaction.     Mcarthur Rossetti

## 2022-10-07 NOTE — Anesthesia Postprocedure Evaluation (Signed)
Anesthesia Post Note  Patient: Gabriel Anderson  Procedure(s) Performed: RIGHT TOTAL HIP ARTHROPLASTY (Right: Hip)     Patient location during evaluation: PACU Anesthesia Type: Spinal Level of consciousness: oriented and awake and alert Pain management: pain level controlled Vital Signs Assessment: post-procedure vital signs reviewed and stable Respiratory status: spontaneous breathing, respiratory function stable and nonlabored ventilation Cardiovascular status: blood pressure returned to baseline and stable Postop Assessment: no headache, no backache, no apparent nausea or vomiting, spinal receding and patient able to bend at knees Anesthetic complications: no   No notable events documented.  Last Vitals:  Vitals:   10/07/22 1015 10/07/22 1030  BP: (!) 99/59 (!) 109/57  Pulse: 68 62  Resp: 17 12  Temp:  36.5 C  SpO2: 97% 97%    Last Pain:  Vitals:   10/07/22 1030  TempSrc:   PainSc: 0-No pain                 Kalysta Kneisley A.

## 2022-10-07 NOTE — Plan of Care (Signed)
  Problem: Education: Goal: Knowledge of General Education information will improve Description: Including pain rating scale, medication(s)/side effects and non-pharmacologic comfort measures Outcome: Progressing   Problem: Health Behavior/Discharge Planning: Goal: Ability to manage health-related needs will improve Outcome: Progressing   Problem: Clinical Measurements: Goal: Will remain free from infection Outcome: Progressing   Problem: Activity: Goal: Risk for activity intolerance will decrease Outcome: Progressing   Problem: Elimination: Goal: Will not experience complications related to bowel motility Outcome: Progressing   Problem: Pain Managment: Goal: General experience of comfort will improve Outcome: Progressing   

## 2022-10-08 ENCOUNTER — Telehealth: Payer: Self-pay | Admitting: Cardiology

## 2022-10-08 ENCOUNTER — Encounter (HOSPITAL_COMMUNITY): Payer: Self-pay | Admitting: Orthopaedic Surgery

## 2022-10-08 ENCOUNTER — Telehealth: Payer: Self-pay | Admitting: Orthopaedic Surgery

## 2022-10-08 DIAGNOSIS — M1611 Unilateral primary osteoarthritis, right hip: Secondary | ICD-10-CM | POA: Diagnosis not present

## 2022-10-08 LAB — CBC
HCT: 34.7 % — ABNORMAL LOW (ref 39.0–52.0)
Hemoglobin: 11.6 g/dL — ABNORMAL LOW (ref 13.0–17.0)
MCH: 29.5 pg (ref 26.0–34.0)
MCHC: 33.4 g/dL (ref 30.0–36.0)
MCV: 88.3 fL (ref 80.0–100.0)
Platelets: 224 10*3/uL (ref 150–400)
RBC: 3.93 MIL/uL — ABNORMAL LOW (ref 4.22–5.81)
RDW: 12.8 % (ref 11.5–15.5)
WBC: 11.1 10*3/uL — ABNORMAL HIGH (ref 4.0–10.5)
nRBC: 0 % (ref 0.0–0.2)

## 2022-10-08 LAB — BASIC METABOLIC PANEL
Anion gap: 6 (ref 5–15)
BUN: 11 mg/dL (ref 6–20)
CO2: 29 mmol/L (ref 22–32)
Calcium: 8.4 mg/dL — ABNORMAL LOW (ref 8.9–10.3)
Chloride: 101 mmol/L (ref 98–111)
Creatinine, Ser: 0.97 mg/dL (ref 0.61–1.24)
GFR, Estimated: >60 mL/min (ref >60)
Glucose, Bld: 145 mg/dL — ABNORMAL HIGH (ref 70–99)
Potassium: 4 mmol/L (ref 3.5–5.1)
Sodium: 136 mmol/L (ref 135–145)

## 2022-10-08 LAB — PROTIME-INR
INR: 1 (ref 0.8–1.2)
Prothrombin Time: 13.4 seconds (ref 11.4–15.2)

## 2022-10-08 MED ORDER — HYDROMORPHONE HCL 4 MG PO TABS: 4.0000 mg | ORAL_TABLET | ORAL | 0 refills | Status: DC | PRN

## 2022-10-08 MED ORDER — WARFARIN SODIUM 5 MG PO TABS: 10.0000 mg | ORAL_TABLET | Freq: Once | ORAL | Status: DC

## 2022-10-08 MED ORDER — TIZANIDINE HCL 4 MG PO TABS: 4.0000 mg | ORAL_TABLET | Freq: Four times a day (QID) | ORAL | 0 refills | Status: DC | PRN

## 2022-10-08 MED ORDER — WARFARIN - PHARMACIST DOSING INPATIENT: Freq: Every day | Status: DC

## 2022-10-08 NOTE — Progress Notes (Signed)
Physical Therapy Treatment Patient Details Name: Gabriel Anderson MRN: [id number] DOB: [birth year] Today's Date: 10/08/2022   History of Present Illness 52 y.o. male presents to [facility] hospital on 10/07/2022 for elective R THA. PMH includes PAF, ascending aortic aneurysm, DOE, CAD, HTN, HLD, Kawasaki syndrome, MI.    PT Comments    Pt was received in supine with pt's wife present and agreeable to therapy. Pt was able to demonstrate exercises with cues for technique. Pt and wife were educated on safe stair technique and were able to demonstrate. Pt able to use the bathroom and complete hygiene with mod I during session. Pt and wife were educated on sit to stand technique to/from a higher surface to simulate car and bed transfers at home and was able to perform with cues for sequencing. Anticipate safe discharge home with family able to assist with pt needs.   Recommendations for follow up therapy are one component of a multi-disciplinary discharge planning process, led by the attending physician.  Recommendations may be updated based on patient status, additional functional criteria and insurance authorization.  Follow Up Recommendations  Follow physician's recommendations for discharge plan and follow up therapies     Assistance Recommended at Discharge PRN  Patient can return home with the following A little help with bathing/dressing/bathroom;Assistance with cooking/housework;Assist for transportation;Help with stairs or ramp for entrance   Equipment Recommendations  Rolling walker (2 wheels);BSC/3in1    Recommendations for Other Services       Precautions / Restrictions Precautions Precautions: Fall;Other (comment) Precaution Booklet Issued: No Restrictions Weight Bearing Restrictions: Yes RLE Weight Bearing: Weight bearing as tolerated     Mobility  Bed Mobility Overal bed mobility: Needs Assistance Bed Mobility: Supine to Sit     Supine to sit: Supervision     General bed  mobility comments: HOB elevated and use of bedrails    Transfers Overall transfer level: Needs assistance Equipment used: Rolling walker (2 wheels) Transfers: Sit to/from Stand Sit to Stand: Min guard                Ambulation/Gait Ambulation/Gait assistance: Min guard, Supervision Gait Distance (Feet): 200 Feet Assistive device: Rolling walker (2 wheels) Gait Pattern/deviations: Step-through pattern, Antalgic Gait velocity: reduced     General Gait Details: Pt expressing excitement about being able to bend his R knee more during gait compared to before THA. Pt with slowed antalgic gait pattern due to increased R hip pain this session.   Stairs Stairs: Yes Stairs assistance: Min guard Stair Management: One rail Left, With cane Number of Stairs: 8 General stair comments: Pt able to demonstrate safe technique with cues for sequencing. Pt practiced with a cane for additional UE support since he has one at home.       Balance Overall balance assessment: Needs assistance Sitting-balance support: No upper extremity supported, Feet supported Sitting balance-Leahy Scale: Good     Standing balance support: Bilateral upper extremity supported, Reliant on assistive device for balance Standing balance-Leahy Scale: Poor Standing balance comment: with RW support                            Cognition Arousal/Alertness: Awake/alert Behavior During Therapy: WFL for tasks assessed/performed Overall Cognitive Status: Within Functional Limits for tasks assessed  Exercises Total Joint Exercises Ankle Circles/Pumps: AROM, Both, 10 reps Quad Sets: AROM, Both, 5 reps Short Arc Quad: AROM, Right, 5 reps Heel Slides: AROM, Right, 5 reps Hip ABduction/ADduction: AROM, Right, 5 reps Long Arc Quad: AROM, Right, 5 reps Standing Hip Extension: AROM, Right, 5 reps Other Exercises Other Exercises: Standing hip  abduction RLE 5 reps    General Comments        Pertinent Vitals/Pain Pain Assessment Pain Assessment: 0-10 Pain Score: 4  Pain Location: R hip incision site Pain Descriptors / Indicators: Sore, Burning, Guarding Pain Intervention(s): Limited activity within patient's tolerance, Monitored during session, Premedicated before session, Ice applied     PT Goals (current goals can now be found in the care plan section) Acute Rehab PT Goals Patient Stated Goal: to go home PT Goal Formulation: With patient Time For Goal Achievement: 10/11/22 Potential to Achieve Goals: Good Progress towards PT goals: Progressing toward goals    Frequency    7X/week      PT Plan Current plan remains appropriate       AM-PAC PT "6 Clicks" Mobility   Outcome Measure  Help needed turning from your back to your side while in a flat bed without using bedrails?: A Little Help needed moving from lying on your back to sitting on the side of a flat bed without using bedrails?: A Little Help needed moving to and from a bed to a chair (including a wheelchair)?: A Little Help needed standing up from a chair using your arms (e.g., wheelchair or bedside chair)?: A Little Help needed to walk in hospital room?: A Little Help needed climbing 3-5 steps with a railing? : A Little 6 Click Score: 18    End of Session Equipment Utilized During Treatment: Gait belt Activity Tolerance: Patient tolerated treatment well Patient left: in chair;with family/visitor present;with call bell/phone within reach Nurse Communication: Mobility status PT Visit Diagnosis: Other abnormalities of gait and mobility (R26.89);Muscle weakness (generalized) (M62.81);Pain Pain - Right/Left: Right Pain - part of body: Hip     Time: 9563-8756 PT Time Calculation (min) (ACUTE ONLY): 41 min  Charges:  $Gait Training: 8-22 mins $Therapeutic Exercise: 8-22 mins $Therapeutic Activity: 8-22 mins                     Michelle Nasuti,  PTA Acute Rehabilitation Services Secure Chat Preferred  [phone]    Michelle Nasuti 10/08/2022, 1:48 PM

## 2022-10-08 NOTE — Progress Notes (Signed)
ANTICOAGULATION CONSULT NOTE - Initial Consult  Pharmacy Consult for warfarin Indication: hx mechanical aortic valve  No Known Allergies  Patient Measurements: Height: 5\' 10"  (177.8 cm) Weight: 117.9 kg (260 lb) IBW/kg (Calculated) : 73  Vital Signs: Temp: 98.1 F (36.7 C) (01/30 2154) Temp Source: Oral (01/30 2154) BP: 139/70 (01/30 1956) Pulse Rate: 70 (01/30 1956)  Labs: Recent Labs    10/07/22 0619 10/08/22 0200  HGB  --  11.6*  HCT  --  34.7*  PLT  --  224  APTT 29  --   LABPROT 13.4 13.4  INR 1.0 1.0  CREATININE  --  0.97    Estimated Creatinine Clearance: 113.4 mL/min (by C-G formula based on SCr of 0.97 mg/dL).   Medical History: Past Medical History:  Diagnosis Date   Acute myocardial infarction, subendocardial infarction, initial episode of care [facility]) 2013   Cardiac catheterization 06/02/12-widely patent previously placed LAD stents. Troponin peaked 3. Normal EF.   Anemia    Arthritis    Ascending aortic aneurysm (HCC) 08/26/2021   4.4 cm on 05/16/21 CT   Coronary atherosclerosis of native coronary artery 05/21/2012   Cardiac catheterization 05/21/12-95% mid LAD lesion at the bifurcation of a large diagonal branch. Normal ejection fraction.   Dyspnea    Heart murmur    aortic stenosis- prior to valve replacement   Hyperlipidemia    Hypertension    Kawasaki syndrome ([place])    early 2000's per patient   Pneumonia    Raynaud's disease    Vasospasm (HCC)     Medications:  Scheduled:   acidophilus  1 capsule Oral Daily   aspirin EC  81 mg Oral Daily   docusate sodium  100 mg Oral BID   gabapentin  100 mg Oral TID   pantoprazole  40 mg Oral Daily   rosuvastatin  20 mg Oral Daily    Assessment: 52 yo male s/p hip replacement surgery 10/07/22. Ok to resume PTA warfarin on 1/3 per ortho. Per cardiology notes pre-op, no Lovenox bridging needed around procedure. PTA warfarin regimen is 10mg  PO daily with last dose being 10/01/22.  INR today  subtherapeutic at 1 due to warfarin being held pre-op.  Goal of Therapy:  INR 1.5-2 per clinic notes Monitor platelets by anticoagulation protocol: Yes   Plan:  Resume warfarin 10mg  PO daily  Dimple Nanas, PharmD, BCPS 10/08/2022 7:42 AM

## 2022-10-08 NOTE — Discharge Instructions (Addendum)

## 2022-10-08 NOTE — Telephone Encounter (Signed)
Coumadin can by held for 5 days prior to surgery.  Please resume post op when felt to be safe.   [name] aware of instructions and will contact Dr Trevor Mace office for clarification.

## 2022-10-08 NOTE — TOC Transition Note (Signed)
Transition of Care [facility]) - CM/SW Discharge Note   Patient Details  Name: Gabriel Anderson MRN: [id number] Date of Birth: 01-26-1971  Transition of Care [facility]) CM/SW Contact:  Sharin Mons, RN Phone Number: 10/08/2022, 10:21 AM   Clinical Narrative:    Patient will DC to: home Anticipated DC date: 10/08/2022 Family notified: yes Transport by: car        -s/p R THA, 1/30 Per MD patient ready for DC today pending therapy clearance. RN, patient, patient's wife, and [place]  ( prearranged by provider's office) notified of DC.  Orders noted for DME: RW, BSC. Referral made with [employer]. Equipment will be delivered to bedside prior to d/c. Post hospital f/u noted on AVS. Pt without RX med concerns. Son to provide transportation to home.  RNCM will sign off for now as intervention is no longer needed. Please consult [place] again if new needs arise.    Final next level of care: [place] Barriers to Discharge: No Barriers Identified   Patient Goals and CMS Choice   Choice offered to / list presented to : Patient  Discharge Placement                         Discharge Plan and Services Additional resources added to the After Visit Summary for                  DME Arranged: Bedside commode, Walker rolling DME Agency: [employer] Date DME Agency Contacted: 10/08/22 Time DME Agency Contacted: [phone] Representative spoke with at DME Agency: Erasmo Downer HH Arranged: PT [place]: [place] Date [place]: 10/08/22 Time [place]: [phone] Representative spoke with at [place]: [place] Determinants of Health ([place]) Interventions SDOH Screenings   Depression (PHQ2-9): Low Risk  (01/29/2022)  Tobacco Use: Low Risk  (10/08/2022)     Readmission Risk Interventions     No data to display

## 2022-10-08 NOTE — Plan of Care (Signed)

## 2022-10-08 NOTE — Telephone Encounter (Signed)
Patient needs to know when he may start back taking his blood thinner warfarin please advise

## 2022-10-08 NOTE — Discharge Summary (Signed)
Patient ID: Gabriel Anderson MRN: [id number] DOB/AGE: [birth year] 52 y.o.  Admit date: 10/07/2022 Discharge date: 10/08/2022  Admission Diagnoses:  Principal Problem:   Unilateral primary osteoarthritis, right hip Active Problems:   OA (osteoarthritis) of hip   Status post total replacement of right hip   Discharge Diagnoses:  Same  Past Medical History:  Diagnosis Date   Acute myocardial infarction, subendocardial infarction, initial episode of care [facility]) 2013   Cardiac catheterization 06/02/12-widely patent previously placed LAD stents. Troponin peaked 3. Normal EF.   Anemia    Arthritis    Ascending aortic aneurysm (HCC) 08/26/2021   4.4 cm on 05/16/21 CT   Coronary atherosclerosis of native coronary artery 05/21/2012   Cardiac catheterization 05/21/12-95% mid LAD lesion at the bifurcation of a large diagonal branch. Normal ejection fraction.   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 male who was admitted 10/07/2022 for operative treatment ofUnilateral primary osteoarthritis, right hip. Patient has severe unremitting pain that affects sleep, daily activities, and work/hobbies. After pre-op clearance the patient was taken to the operating room on 10/07/2022 and underwent  Procedure(s): RIGHT TOTAL HIP ARTHROPLASTY.    Patient was given perioperative antibiotics:  Anti-infectives (From admission, onward)    Start     Dose/Rate Route Frequency Ordered Stop   10/07/22 1400  ceFAZolin (ANCEF) IVPB 1 g/50 mL premix        1 g 100 mL/hr over 30 Minutes Intravenous Every 6 hours 10/07/22 1048 10/07/22 2031   10/07/22 0600  ceFAZolin (ANCEF)  IVPB 2g/100 mL premix        2 g 200 mL/hr over 30 Minutes Intravenous On call to O.R. 10/07/22 0558 10/07/22 0800        Patient was given sequential compression devices, early ambulation, and chemoprophylaxis to prevent DVT.  Patient benefited maximally from hospital stay and there were no complications.    Recent vital signs: Patient Vitals for the past 24 hrs:  BP Temp Temp src Pulse Resp SpO2  10/07/22 2154 -- 98.1 F (36.7 C) Oral -- -- --  10/07/22 1956 139/70 -- -- 70 16 96 %  10/07/22 1450 (!) 113/96 (!) 97.5 F (36.4 C) Oral 61 -- 96 %  10/07/22 1309 131/67 (!) 97.5 F (36.4 C) -- 63 16 95 %  10/07/22 1130 138/76 -- -- 60 12 96 %  10/07/22 1030 (!) 109/57 97.7 F (36.5 C) -- 62 12 97 %  10/07/22 1015 (!) 99/59 -- -- 68 17 97 %  10/07/22 1000 (!) 105/56 -- -- 60 14 96 %  10/07/22 0945 (!) 105/49 -- -- 64 13 97 %  10/07/22 0930 113/64 -- -- 75 (!) 25 94 %  10/07/22 0925 (!) 90/47 97.7 F (36.5 C) -- 67 13 100 %     Recent laboratory studies:  Recent Labs    10/07/22 0619 10/08/22 0200  WBC  --  11.1*  HGB  --  11.6*  HCT  --  34.7*  PLT  --  224  NA  --  136  K  --  4.0  CL  --  101  CO2  --  29  BUN  --  11  CREATININE  --  0.97  GLUCOSE  --  145*  INR 1.0 1.0  CALCIUM  --  8.4*     Discharge Medications:   Allergies as of 10/08/2022   No Known Allergies      Medication List     TAKE these medications    acetaminophen 500 MG tablet Commonly known as: TYLENOL Take 1,000 mg by mouth every 8 (eight) hours as needed for moderate pain.   acidophilus Caps capsule Take 1 capsule by mouth daily.   amoxicillin 500 MG tablet Commonly known as: AMOXIL Take 2,000 mg by mouth See admin instructions. Take 2000 mg by mouth one hour prior to dental procedures   aspirin EC 81 MG tablet Take 1 tablet (81 mg total) by mouth daily.   D3 SUPER STRENGTH PO Take 1 tablet by mouth daily.   HYDROmorphone 4 MG tablet Commonly known as: Dilaudid Take 1  tablet (4 mg total) by mouth every 4 (four) hours as needed for severe pain.   nitroGLYCERIN 0.4 MG SL tablet Commonly known as: NITROSTAT Place 1 tablet (0.4 mg total) under the tongue every 5 (five) minutes as needed. Max of 3 doses, then 911   rosuvastatin 40 MG tablet Commonly known as: CRESTOR Take 20 mg by mouth daily.   tiZANidine 4 MG tablet Commonly known as: Zanaflex Take 1 tablet (4 mg total) by mouth every 6 (six) hours as needed for muscle spasms.   warfarin 5 MG tablet Commonly known as: COUMADIN Take as directed. If you are unsure how to take this medication, talk to your nurse or doctor. Original instructions: TAKE 2 TABLETS BY MOUTH DAILY OR AS INSTRUCTED BY THE ANTICOAGULATION CLINIC.               Durable Medical Equipment  (From admission, onward)           Start     Ordered   10/07/22 1309  DME 3 n 1  Once        10/07/22 1308   10/07/22 1309  DME Walker rolling  Once       Question Answer Comment  Walker: With 5 Inch Wheels   Patient needs a walker to treat with the following condition Status post total replacement of right hip      10/07/22 1308            Diagnostic Studies: DG Pelvis Portable  Result Date: 10/07/2022 CLINICAL DATA:  Right total hip arthroplasty. EXAM: PORTABLE PELVIS 1-2 VIEWS COMPARISON:  Radiographs 12/30/2013. FINDINGS: 1036 hours. Patient has undergone interval right total hip arthroplasty. The hardware appears well positioned. No evidence of acute fracture or dislocation on this single AP view. Previous left total hip arthroplasty is grossly stable, although the distal end of the left femoral prosthesis is not imaged. There is a small amount of gas within the right hip and surrounding soft tissues. Right-sided skin staples are in place. IMPRESSION: Interval right total hip arthroplasty without demonstrated complication. Electronically Signed   By: Richardean Sale M.D.   On: 10/07/2022 10:54   DG HIP UNILAT WITH  PELVIS 2-3 VIEWS RIGHT  Result Date: 10/07/2022 CLINICAL DATA:  Right total hip arthroplasty EXAM: DG HIP (WITH OR WITHOUT PELVIS) 2-3V RIGHT COMPARISON:  08/04/2022 FINDINGS: Intraoperative images demonstrate changes of right hip replacement. Normal AP alignment. No hardware bony complicating feature. IMPRESSION: Right hip replacement.  No visible complicating feature. Electronically Signed   By: Rolm Baptise M.D.   On: 10/07/2022 09:07   DG C-Arm 1-60 Min-No Report  Result Date: 10/07/2022 Fluoroscopy was utilized by the requesting physician.  No radiographic interpretation.   DG C-Arm 1-60 Min-No Report  Result Date: 10/07/2022 Fluoroscopy was utilized by the requesting physician.  No radiographic interpretation.    Disposition: Discharge disposition: 01-Home or Self Care          Follow-up Information     Mcarthur Rossetti, MD Follow up in 2 week(s).   Specialty: Orthopedic Surgery Contact information: [address] [place] [address] [phone]                  Signed: Mcarthur Rossetti 10/08/2022, 7:39 AM

## 2022-10-08 NOTE — Telephone Encounter (Signed)
Pt aware of the below message 

## 2022-10-08 NOTE — Telephone Encounter (Signed)
Pt c/o medication issue:  1. Name of Medication: Warfarin - Pharmacist Dosing Inpatient   2. How are you currently taking this medication (dosage and times per day)?   3. Are you having a reaction (difficulty breathing--STAT)?   4. What is your medication issue? Pt's wife would like a call back to discuss when they should start this medication back after surgery.

## 2022-10-08 NOTE — Progress Notes (Signed)
Subjective: 1 Day Post-Op Procedure(s) (LRB): RIGHT TOTAL HIP ARTHROPLASTY (Right) Patient reports pain as moderate.    Objective: Vital signs in last 24 hours: Temp:  [97.5 F (36.4 C)-98.1 F (36.7 C)] 98.1 F (36.7 C) (01/30 2154) Pulse Rate:  [60-75] 70 (01/30 1956) Resp:  [12-25] 16 (01/30 1956) BP: (90-139)/(47-96) 139/70 (01/30 1956) SpO2:  [94 %-100 %] 96 % (01/30 1956)  Intake/Output from previous day: 01/30 0701 - 01/31 0700 In: 2220 [P.O.:720; I.V.:1500] Out: 3170 [Urine:2900; Blood:270] Intake/Output this shift: No intake/output data recorded.  Recent Labs    10/08/22 0200  HGB 11.6*   Recent Labs    10/08/22 0200  WBC 11.1*  RBC 3.93*  HCT 34.7*  PLT 224   Recent Labs    10/08/22 0200  NA 136  K 4.0  CL 101  CO2 29  BUN 11  CREATININE 0.97  GLUCOSE 145*  CALCIUM 8.4*   Recent Labs    10/07/22 0619 10/08/22 0200  INR 1.0 1.0    Sensation intact distally Intact pulses distally Dorsiflexion/Plantar flexion intact Incision: dressing C/D/I   Assessment/Plan: 1 Day Post-Op Procedure(s) (LRB): RIGHT TOTAL HIP ARTHROPLASTY (Right) Up with therapy Discharge home with home health      Mcarthur Rossetti 10/08/2022, 7:37 AM

## 2022-10-09 ENCOUNTER — Telehealth: Payer: Self-pay | Admitting: Orthopaedic Surgery

## 2022-10-09 NOTE — Telephone Encounter (Signed)
Ordered through [employer] portal

## 2022-10-09 NOTE — Telephone Encounter (Signed)
Patient states that the bathroom adapter is too small and he needs to change it out but he will have to pay out of pocket unless Dr. [name] send another order for the larger size. Please advise. Send order to [facility]

## 2022-10-13 ENCOUNTER — Telehealth: Payer: Self-pay | Admitting: Orthopaedic Surgery

## 2022-10-13 ENCOUNTER — Other Ambulatory Visit: Payer: Self-pay | Admitting: Orthopaedic Surgery

## 2022-10-13 MED ORDER — OXYCODONE-ACETAMINOPHEN 5-325 MG PO TABS: 1.0000 | ORAL_TABLET | Freq: Four times a day (QID) | ORAL | 0 refills | Status: DC | PRN

## 2022-10-13 NOTE — Telephone Encounter (Signed)
Patient need a Rx on pain medication. States not Dilaudid, maybe something like Oxycodone--per patient.Gabriel Anderson

## 2022-10-14 ENCOUNTER — Ambulatory Visit: Payer: 59

## 2022-10-16 ENCOUNTER — Other Ambulatory Visit: Payer: Self-pay | Admitting: Orthopaedic Surgery

## 2022-10-16 ENCOUNTER — Telehealth: Payer: Self-pay | Admitting: Orthopaedic Surgery

## 2022-10-16 MED ORDER — TIZANIDINE HCL 4 MG PO TABS: 4.0000 mg | ORAL_TABLET | Freq: Four times a day (QID) | ORAL | 0 refills | Status: DC | PRN

## 2022-10-16 NOTE — Telephone Encounter (Signed)
Patient requesting a refill on his muscle relaxer's. Please advise

## 2022-10-20 ENCOUNTER — Encounter: Payer: No Typology Code available for payment source | Admitting: Orthopaedic Surgery

## 2022-10-21 ENCOUNTER — Ambulatory Visit: Payer: 59 | Attending: Internal Medicine | Admitting: *Deleted

## 2022-10-21 DIAGNOSIS — Z5181 Encounter for therapeutic drug level monitoring: Secondary | ICD-10-CM | POA: Diagnosis not present

## 2022-10-21 DIAGNOSIS — Z952 Presence of prosthetic heart valve: Secondary | ICD-10-CM

## 2022-10-21 LAB — POCT INR: POC INR: 3.7

## 2022-10-21 NOTE — Patient Instructions (Signed)
Description   -Hold warfarin today and tomorrow  -Continue taking 2 tablets (42m) daily. Stay consistent with your vitamin K intake/supplements the MD placed you on recently. Recheck INR in  2 weeks.Anticoagulation Clinic: [phone] Fax #[phone]

## 2022-10-22 ENCOUNTER — Ambulatory Visit: Payer: No Typology Code available for payment source | Admitting: Orthopaedic Surgery

## 2022-10-22 DIAGNOSIS — Z96641 Presence of right artificial hip joint: Secondary | ICD-10-CM

## 2022-10-22 MED ORDER — OXYCODONE-ACETAMINOPHEN 5-325 MG PO TABS: 1.0000 | ORAL_TABLET | Freq: Four times a day (QID) | ORAL | 0 refills | Status: DC | PRN

## 2022-10-22 NOTE — Progress Notes (Signed)
Gabriel Anderson is here for his first postoperative visit status post a right total hip replacement.  He is on Coumadin and a daily aspirin chronically.  He does need a refill of oxycodone.  He says he is doing great.  On exam he does have a moderate seroma and I was able to actually aspirate 130 cc of fluid off of the area surrounding his hip.  His hip incision otherwise looks good.  There is just a tiny area at the top he will need some ointment like Bactroban ointment on daily.  His ligaments look good as well.  I did send in some more Percocet for him.  The next him and his see him is not for 4 weeks unless he is having issues.  If he gets to where he has a significant reaccumulation of his seroma he will call to be seen earlier for an aspiration.  All questions and concerns were answered and addressed.

## 2022-10-27 ENCOUNTER — Encounter: Payer: Self-pay | Admitting: Orthopaedic Surgery

## 2022-10-27 ENCOUNTER — Telehealth: Payer: Self-pay | Admitting: Orthopaedic Surgery

## 2022-10-27 ENCOUNTER — Ambulatory Visit (INDEPENDENT_AMBULATORY_CARE_PROVIDER_SITE_OTHER): Payer: No Typology Code available for payment source | Admitting: Orthopaedic Surgery

## 2022-10-27 ENCOUNTER — Other Ambulatory Visit: Payer: Self-pay

## 2022-10-27 DIAGNOSIS — Z96641 Presence of right artificial hip joint: Secondary | ICD-10-CM

## 2022-10-27 MED ORDER — TIZANIDINE HCL 4 MG PO TABS: 4.0000 mg | ORAL_TABLET | Freq: Four times a day (QID) | ORAL | 0 refills | Status: DC | PRN

## 2022-10-27 NOTE — Progress Notes (Signed)
The patient comes in today for repeat aspiration of a seroma of his right hip.  He has a total hip arthroplasty a few weeks ago on 30 January.  He is on chronic Coumadin therapy.  He says his hip is otherwise doing great.  I was able to aspirate 150 cc of seroma fluid from his hip.  This gave him immediate relief.  Will see him back in 1 week for likely repeat aspiration.  If there is any issues before then he knows to let us know.

## 2022-10-27 NOTE — Telephone Encounter (Signed)
Patient called advised the swelling has come back and fluid has built again in his right thigh. Patient also asked if he can get a refill  on Rx Tizanidine. The number to contact patient is [phone]

## 2022-10-27 NOTE — Telephone Encounter (Signed)
Coming in at 1:00 Zanaflex refilled

## 2022-11-03 ENCOUNTER — Encounter: Payer: 59 | Admitting: Orthopaedic Surgery

## 2022-11-03 ENCOUNTER — Telehealth: Payer: Self-pay | Admitting: Orthopaedic Surgery

## 2022-11-03 NOTE — Telephone Encounter (Signed)
Patient would like a call about appt today, was advised to call by Dr and Nurse.

## 2022-11-03 NOTE — Telephone Encounter (Signed)
Patient called and states that he really doesn't have much of a seroma to be drained, so we will cancel his appt today and I told him to give [place] a call back if he needs [place] for this before his next appt

## 2022-11-04 ENCOUNTER — Ambulatory Visit: Payer: 59 | Attending: Cardiology | Admitting: *Deleted

## 2022-11-04 ENCOUNTER — Ambulatory Visit: Payer: 59

## 2022-11-04 DIAGNOSIS — Z5181 Encounter for therapeutic drug level monitoring: Secondary | ICD-10-CM | POA: Diagnosis not present

## 2022-11-04 DIAGNOSIS — Z952 Presence of prosthetic heart valve: Secondary | ICD-10-CM

## 2022-11-04 LAB — POCT INR: POC INR: 2

## 2022-11-04 NOTE — Patient Instructions (Signed)
Description   Continue taking 2 tablets ('10mg'$ ) daily. Stay consistent with your vitamin K intake/supplements the MD placed you on recently. Recheck INR in  3 weeks.Anticoagulation Clinic: [phone] Procedure Fax [phone]

## 2022-11-13 ENCOUNTER — Encounter: Payer: Self-pay | Admitting: Radiology

## 2022-11-14 ENCOUNTER — Other Ambulatory Visit: Payer: Self-pay | Admitting: Orthopaedic Surgery

## 2022-11-14 ENCOUNTER — Encounter: Payer: Self-pay | Admitting: Orthopaedic Surgery

## 2022-11-14 ENCOUNTER — Other Ambulatory Visit: Payer: Self-pay | Admitting: Physician Assistant

## 2022-11-14 MED ORDER — OXYCODONE-ACETAMINOPHEN 5-325 MG PO TABS: 1.0000 | ORAL_TABLET | Freq: Four times a day (QID) | ORAL | 0 refills | Status: DC | PRN

## 2022-11-17 ENCOUNTER — Other Ambulatory Visit: Payer: Self-pay

## 2022-11-17 MED ORDER — TIZANIDINE HCL 4 MG PO TABS: 4.0000 mg | ORAL_TABLET | Freq: Four times a day (QID) | ORAL | 0 refills | Status: AC | PRN

## 2022-11-25 ENCOUNTER — Ambulatory Visit: Payer: No Typology Code available for payment source | Attending: Cardiovascular Disease | Admitting: *Deleted

## 2022-11-25 DIAGNOSIS — Z952 Presence of prosthetic heart valve: Secondary | ICD-10-CM | POA: Diagnosis not present

## 2022-11-25 DIAGNOSIS — Z5181 Encounter for therapeutic drug level monitoring: Secondary | ICD-10-CM

## 2022-11-25 LAB — POCT INR: POC INR: 4.7

## 2022-11-25 NOTE — Patient Instructions (Signed)
Description   Hold warfarin today and tomorrow.  START taking warfarin 2 tablets daily except for 1.5 tablets on Monday and Friday. Recheck INR in 2 weeks. Anticoagulation Clinic: [phone] Procedure Fax [phone]

## 2022-11-26 ENCOUNTER — Encounter: Payer: Self-pay | Admitting: Orthopaedic Surgery

## 2022-11-26 ENCOUNTER — Ambulatory Visit: Payer: No Typology Code available for payment source | Admitting: Orthopaedic Surgery

## 2022-11-26 DIAGNOSIS — Z96641 Presence of right artificial hip joint: Secondary | ICD-10-CM

## 2022-11-26 NOTE — Progress Notes (Signed)
The patient is now almost 8 weeks status post a right total hip arthroplasty.  We replaced his left hip remotely.  He did see Dr. Junius Roads yesterday who provided a steroid injection in his right knee and that has been helpful.  He does ambulate the cane.  He is making good progress overall.  He still has some problems sleeping at night and does take an occasional oxycodone but he has plenty left he states.  His right operative hip is much more flexible with good rotation as well.  He still has a slight limp when he first gets up and some this is knee related as well.  His knee shows just a slight effusion but he did have a steroid injection yesterday.  From my standpoint he will continue to increase his activities as comfort allows.  I am fine with him getting on a 0 turn lawnmower when he is ready to do so.  I will see him back in 3 months with a standing low AP pelvis and lateral of his right operative hip.

## 2022-12-09 ENCOUNTER — Ambulatory Visit: Payer: No Typology Code available for payment source

## 2022-12-15 ENCOUNTER — Other Ambulatory Visit: Payer: Self-pay | Admitting: Orthopaedic Surgery

## 2022-12-15 MED ORDER — TRAMADOL HCL 50 MG PO TABS: 100.0000 mg | ORAL_TABLET | Freq: Three times a day (TID) | ORAL | 0 refills | Status: DC | PRN

## 2022-12-18 ENCOUNTER — Ambulatory Visit
Admission: RE | Admit: 2022-12-18 | Discharge: 2022-12-18 | Disposition: A | Discharge status: Home / Self Care | Payer: No Typology Code available for payment source | Source: Ambulatory Visit | Attending: Vascular Surgery | Admitting: Vascular Surgery

## 2022-12-18 ENCOUNTER — Other Ambulatory Visit (HOSPITAL_COMMUNITY): Payer: Self-pay | Admitting: Family Medicine

## 2022-12-18 DIAGNOSIS — R609 Edema, unspecified: Secondary | ICD-10-CM

## 2022-12-18 LAB — LAB REPORT - SCANNED: EGFR: 105

## 2022-12-19 ENCOUNTER — Ambulatory Visit: Payer: No Typology Code available for payment source

## 2022-12-26 ENCOUNTER — Ambulatory Visit: Payer: No Typology Code available for payment source | Attending: Cardiology | Admitting: *Deleted

## 2022-12-26 DIAGNOSIS — Z5181 Encounter for therapeutic drug level monitoring: Secondary | ICD-10-CM | POA: Diagnosis not present

## 2022-12-26 DIAGNOSIS — Z952 Presence of prosthetic heart valve: Secondary | ICD-10-CM

## 2022-12-26 LAB — POCT INR: POC INR: 3.5

## 2022-12-26 NOTE — Patient Instructions (Signed)
Description   Hold warfarin today and then START taking warfarin 1.5 tablets daily except for 2 tablet on Sunday, Tuesday and Thursdays. Recheck INR in 2 weeks. . Anticoagulation Clinic: [phone] Procedure Fax #[phone]

## 2023-01-09 ENCOUNTER — Ambulatory Visit: Payer: No Typology Code available for payment source | Attending: Cardiology

## 2023-01-09 DIAGNOSIS — Z952 Presence of prosthetic heart valve: Secondary | ICD-10-CM | POA: Diagnosis not present

## 2023-01-09 DIAGNOSIS — Z5181 Encounter for therapeutic drug level monitoring: Secondary | ICD-10-CM | POA: Diagnosis not present

## 2023-01-09 LAB — POCT INR: INR: 1.7 — AB (ref 2.0–3.0)

## 2023-01-09 NOTE — Patient Instructions (Signed)
Description   Continue taking warfarin 1.5 tablets daily except for 2 tablet on Sunday, Tuesday and Thursdays.  Recheck INR in 3 weeks. Anticoagulation Clinic: [phone]  Procedure Fax #[phone]

## 2023-01-16 ENCOUNTER — Ambulatory Visit: Payer: No Typology Code available for payment source | Admitting: Cardiology

## 2023-01-23 ENCOUNTER — Ambulatory Visit: Payer: No Typology Code available for payment source | Attending: Cardiology | Admitting: Cardiology

## 2023-01-23 ENCOUNTER — Encounter: Payer: Self-pay | Admitting: Cardiology

## 2023-01-23 VITALS — BP 146/80 | HR 74 | Ht 70.0 in | Wt 268.2 lb

## 2023-01-23 DIAGNOSIS — I2510 Atherosclerotic heart disease of native coronary artery without angina pectoris: Secondary | ICD-10-CM | POA: Diagnosis not present

## 2023-01-23 DIAGNOSIS — Z952 Presence of prosthetic heart valve: Secondary | ICD-10-CM | POA: Diagnosis not present

## 2023-01-23 DIAGNOSIS — I2583 Coronary atherosclerosis due to lipid rich plaque: Secondary | ICD-10-CM

## 2023-01-23 NOTE — Progress Notes (Signed)
Cardiology Office Note:    Date:  01/23/2023   ID:  Gabriel Anderson, DOB May 23, 1971, MRN [id number]  PCP:  Lavada Mesi, MD   [facility] Providers Cardiologist:  Donato Schultz, MD     Referring MD: Lavada Mesi, MD    History of Present Illness:    Gabriel Anderson is a 52 y.o. male here for follow-up visit aortic valve replacement. AVR using a 23 mm On-X mechanical valve and supra-coronary replacement of an ascending aortic aneurysm on 09/16/2021. His INR has been followed in our Coumadin clinic.  Has been therapeutic. Hip replacement.  PCI to the LAD in 2013  Metoprolol - ED side effect. 50 bid. Cardiac rehab. Cut back. Off amio now. PAF post op.   Kawasaki dz in the past. Hands and feet peeled. Age 79.   [employer] football scholarship.  Plays center.  Wife, [name]. BrCa, clear   Dr. Magnus Ivan hip replacement.  Feels much better.  No chest pain no shortness of breath.   Past Medical History:  Diagnosis Date   Acute myocardial infarction, subendocardial infarction, initial episode of care [facility]) 2013   Cardiac catheterization 06/02/12-widely patent previously placed LAD stents. Troponin peaked 3. Normal EF.   Anemia    Arthritis    Ascending aortic aneurysm (HCC) 08/26/2021   4.4 cm on 05/16/21 CT   Coronary atherosclerosis of native coronary artery 05/21/2012   Cardiac catheterization 05/21/12-95% mid LAD lesion at the bifurcation of a large diagonal branch. Normal ejection fraction.   Dyspnea    Heart murmur    aortic stenosis- prior to valve replacement   Hyperlipidemia    Hypertension    Kawasaki syndrome (HCC)    early 2000's per patient   Pneumonia    Raynaud's disease    Vasospasm (HCC)     Past Surgical History:  Procedure Laterality Date    cardiacstent x 2  09/09/2011   AORTIC VALVE REPLACEMENT N/A 09/16/2021   Procedure: AORTIC VALVE REPLACEMENT (AVR) USING ON-X PROSTHETIC VALVE;  Surgeon: Alleen Borne, MD;  Location: [facility] OR;  Service:  Open Heart Surgery;  Laterality: N/A;  Circ Arrest   CARDIAC CATHETERIZATION  05/09/2012   PCI LAD at takeoff of large diagonal   ELBOW SURGERY Left 09/08/1982   LEFT HEART CATHETERIZATION WITH CORONARY ANGIOGRAM N/A 05/21/2012   Procedure: LEFT HEART CATHETERIZATION WITH CORONARY ANGIOGRAM;  Surgeon: Corky Crafts, MD;  Location: [facility] CATH LAB;  Service: Cardiovascular;  Laterality: N/A;   LEFT HEART CATHETERIZATION WITH CORONARY ANGIOGRAM N/A 06/02/2012   Procedure: LEFT HEART CATHETERIZATION WITH CORONARY ANGIOGRAM;  Surgeon: Donato Schultz, MD;  Location: [facility] CATH LAB;  Service: Cardiovascular;  Laterality: N/A;   PERCUTANEOUS CORONARY STENT INTERVENTION (PCI-S)  05/21/2012   Procedure: PERCUTANEOUS CORONARY STENT INTERVENTION (PCI-S);  Surgeon: Corky Crafts, MD;  Location: [facility] CATH LAB;  Service: Cardiovascular;;   REPLACEMENT ASCENDING AORTA N/A 09/16/2021   Procedure: REPLACEMENT OF ASCENDING AORTA USING HEMASHIELD PLATINUM X10MM [id number] GRAFT;  Surgeon: Alleen Borne, MD;  Location: [facility] OR;  Service: Open Heart Surgery;  Laterality: N/A;   REVERSE SHOULDER ARTHROPLASTY Right 05/23/2020   Procedure: REVERSE SHOULDER ARTHROPLASTY;  Surgeon: Bjorn Pippin, MD;  Location: [facility] ORS;  Service: Orthopedics;  Laterality: Right;   RIGHT/LEFT HEART CATH AND CORONARY ANGIOGRAPHY N/A 12/19/2020   Procedure: RIGHT/LEFT HEART CATH AND CORONARY ANGIOGRAPHY;  Surgeon: Marykay Lex, MD;  Location: [facility] INVASIVE CV LAB;  Service: Cardiovascular;  Laterality: N/A;  ROTATOR CUFF REPAIR Right 09/09/2007   TEE WITHOUT CARDIOVERSION N/A 09/16/2021   Procedure: TRANSESOPHAGEAL ECHOCARDIOGRAM (TEE);  Surgeon: Alleen Borne, MD;  Location: [facility] OR;  Service: Open Heart Surgery;  Laterality: N/A;   TONSILLECTOMY     70s   TOTAL HIP ARTHROPLASTY Left 12/30/2013   Procedure: LEFT TOTAL HIP ARTHROPLASTY ANTERIOR APPROACH;  Surgeon: Kathryne Hitch, MD;  Location: [facility] ORS;  Service: Orthopedics;   Laterality: Left;   TOTAL HIP ARTHROPLASTY Right 10/07/2022   Procedure: RIGHT TOTAL HIP ARTHROPLASTY;  Surgeon: Kathryne Hitch, MD;  Location: [facility] OR;  Service: Orthopedics;  Laterality: Right;    Current Medications: Current Meds  Medication Sig   acetaminophen (TYLENOL) 500 MG tablet Take 1,000 mg by mouth every 8 (eight) hours as needed for moderate pain.   acidophilus (RISAQUAD) CAPS capsule Take 1 capsule by mouth daily.   amoxicillin (AMOXIL) 500 MG tablet Take 2,000 mg by mouth See admin instructions. Take 2000 mg by mouth one hour prior to dental procedures   aspirin 81 MG tablet Take 1 tablet (81 mg total) by mouth daily.   Cholecalciferol (D3 SUPER STRENGTH PO) Take 1 tablet by mouth daily.   nitroGLYCERIN (NITROSTAT) 0.4 MG SL tablet Place 1 tablet (0.4 mg total) under the tongue every 5 (five) minutes as needed. Max of 3 doses, then 911   oxyCODONE-acetaminophen (PERCOCET/ROXICET) 5-325 MG tablet Take 1-2 tablets by mouth every 6 (six) hours as needed for severe pain.   rosuvastatin (CRESTOR) 40 MG tablet Take 20 mg by mouth daily.   tiZANidine (ZANAFLEX) 4 MG tablet Take 1 tablet (4 mg total) by mouth every 6 (six) hours as needed for muscle spasms.   traMADol (ULTRAM) 50 MG tablet Take 2 tablets (100 mg total) by mouth 3 (three) times daily as needed.   warfarin (COUMADIN) 5 MG tablet TAKE 2 TABLETS BY MOUTH DAILY OR AS INSTRUCTED BY THE ANTICOAGULATION CLINIC.     Allergies:   Patient has no known allergies.   Social History   Socioeconomic History   Marital status: Married    Spouse name: [name]   Number of children: 2   Years of education: 12   Highest education level: Not on file  Occupational History   Not on file  Tobacco Use   Smoking status: Never   Smokeless tobacco: Never  Vaping Use   Vaping Use: Never used  Substance and Sexual Activity   Alcohol use: Not Currently    Alcohol/week: 11.0 standard drinks of alcohol    Types: 5 Cans of beer, 2  Shots of liquor, 4 Standard drinks or equivalent per week    Comment: pt reports not drinking for months   Drug use: Not Currently    Frequency: 7.0 times per week    Types: Marijuana    Comment: hasn't smoked in months per patient   Sexual activity: Yes    Partners: Female  Other Topics Concern   Not on file  Social History Narrative   Not on file   Social Determinants of Health   Financial Resource Strain: Not on file  Food Insecurity: Not on file  Transportation Needs: Not on file  Physical Activity: Not on file  Stress: Not on file  Social Connections: Not on file     Family History: The patient's family history includes COPD in his mother; Diabetes in his father; Heart disease in his father; Heart failure in his father; Hypertension in his father; Stroke in his sister.  ROS:   Please see the history of present illness.    (+) Lightheadedness (+) Dizziness No fevers chills nausea vomiting syncope bleeding all other systems reviewed and are negative.  EKGs/Labs/Other Studies Reviewed:    The following studies were reviewed today: Cardiac Studies & Procedures   CARDIAC CATHETERIZATION  CARDIAC CATHETERIZATION 12/19/2020  Narrative  Prox LAD to Mid LAD lesion is 20% stenosed.  Mid LAD lesion is 20% stenosed.  LV end diastolic pressure is moderately elevated.  There is moderate aortic valve stenosis.  POST-OPERATIVE DIAGNOSIS:  Widely patent LAD stent with maybe 20% prestent stenosis and 20% at the distal edge of the stent.  Otherwise, no significant CAD other than long 30% proximal RCA stenosis.  Discrepant cardiac output by Fick and thermodilution: Fick CO-CI 6.87-3; thermodilution 5.61-2.45  Right Heart Cath Pressures:  RA mean 7 mmHg RVP-EDP: 32/2 mmHg-9 mmHg;  PCWP 12 mmHg; PA P 28/6 mmHg-mean 13 mmHg.  LVP-EDP: 174/5 mmHg - 22 mmHg; AoP - MAP: 141/73 mmHg-100 mmHg.  Aortic valve gradients: P-P 33 mmHg, mean 41.9 mm.  AVA estimated between 0.95 and  1.16 cm -> also discrepant findings, would suggest moderate to severe AS.   RECOMMENDATIONS  Discharge home and follow-up with Dr. Anne Fu.  Based on symptoms, and echocardiographic evidence, he may be at the point where it meets criteria for consideration for valve placement.  Borderline pressure readings, but AVA estimations are not severe.   Results discussed with primary cardiologist.   Bryan Lemma, MD  Findings Coronary Findings Diagnostic  Dominance: Right  Left Main Vessel is large.  Left Anterior Descending Prox LAD to Mid LAD lesion is 20% stenosed. The lesion is focal and eccentric. Mid LAD lesion is 20% stenosed. The lesion was previously treated using a drug eluting stent over 2 years ago. Previously placed stent displays no restenosis.  First Diagonal Branch Vessel is moderate in size.  First Septal Branch Vessel is small in size.  Left Circumflex Vessel is large.  First Obtuse Marginal Branch Vessel is small in size.  Left Posterior Atrioventricular Artery Vessel is small in size.  Right Coronary Artery  Second Right Posterolateral Branch Vessel is small in size.  Intervention  No interventions have been documented.     ECHOCARDIOGRAM  ECHOCARDIOGRAM COMPLETE 11/04/2021  Narrative ECHOCARDIOGRAM REPORT    Patient Name:   FUAD HIGGINS Date of Exam: 11/04/2021 Medical Rec #:  [id number]        Height:       70.0 in Accession #:    [id number]       Weight:       240.0 lb Date of Birth:  November 16, 1970        BSA:          2.255 m Patient Age:    50 years         BP:           132/76 mmHg Patient Gender: M                HR:           58 bpm. Exam Location:  [address]  Procedure: 2D Echo, 3D Echo, Cardiac Doppler and Color Doppler  Indications:    I35.0 Aortic Stenosis  History:        Patient has prior history of Echocardiogram examinations, most recent 09/16/2021. CAD and Previous Myocardial Infarction, Aortic Valve Disease,  Signs/Symptoms:Dyspnea and Chest Pain; Risk Factors:Hypertension, Dyslipidemia and Family History of Coronary Artery  Disease. Aortic Stenosis with Ascending Aortic Aneurysm, Status Post Aortic Valve Replacement (09-16-21, 23mm ON-X Mechanical Valve and 28mm Hemashield Platinum Graft Repair). Aortic Valve: 23 mm ON-X mechanical valve valve is present in the aortic position. Procedure Date: 09/16/21.  Sonographer:    Farrel Conners RDCS Referring Phys: Ernest Haber ROBERTS  IMPRESSIONS   1. S/P AVR with mean gradient 7 mmHg, DI 0.63 and mild AI. 2. Left ventricular ejection fraction, by estimation, is 50 to 55%. The left ventricle has low normal function. The left ventricle has no regional wall motion abnormalities. There is mild left ventricular hypertrophy. Left ventricular diastolic parameters were normal. Elevated left atrial pressure. 3. Right ventricular systolic function is normal. The right ventricular size is normal. Tricuspid regurgitation signal is inadequate for assessing PA pressure. 4. The mitral valve is normal in structure. Mild mitral valve regurgitation. No evidence of mitral stenosis. 5. The aortic valve has been repaired/replaced. Aortic valve regurgitation is mild. No aortic stenosis is present. There is a 23 mm ON-X mechanical valve valve present in the aortic position. Procedure Date: 09/16/21. 6. The inferior vena cava is normal in size with greater than 50% respiratory variability, suggesting right atrial pressure of 3 mmHg.  FINDINGS Left Ventricle: Left ventricular ejection fraction, by estimation, is 50 to 55%. The left ventricle has low normal function. The left ventricle has no regional wall motion abnormalities. The left ventricular internal cavity size was normal in size. There is mild left ventricular hypertrophy. Left ventricular diastolic parameters were normal. Elevated left atrial pressure.  Right Ventricle: The right ventricular size is normal. Right  ventricular systolic function is normal. Tricuspid regurgitation signal is inadequate for assessing PA pressure. The tricuspid regurgitant velocity is 2.10 m/s, and with an assumed right atrial pressure of 3 mmHg, the estimated right ventricular systolic pressure is 20.6 mmHg.  Left Atrium: Left atrial size was normal in size.  Right Atrium: Right atrial size was normal in size.  Pericardium: There is no evidence of pericardial effusion.  Mitral Valve: The mitral valve is normal in structure. Mild mitral valve regurgitation. No evidence of mitral valve stenosis.  Tricuspid Valve: The tricuspid valve is normal in structure. Tricuspid valve regurgitation is trivial. No evidence of tricuspid stenosis.  Aortic Valve: The aortic valve has been repaired/replaced. Aortic valve regurgitation is mild. Aortic regurgitation PHT measures 381 msec. No aortic stenosis is present. Aortic valve mean gradient measures 7.5 mmHg. Aortic valve peak gradient measures 13.5 mmHg. Aortic valve area, by VTI measures 3.90 cm. There is a 23 mm ON-X mechanical valve valve present in the aortic position. Procedure Date: 09/16/21.  Pulmonic Valve: The pulmonic valve was normal in structure. Pulmonic valve regurgitation is not visualized. No evidence of pulmonic stenosis.  Aorta: The aortic root is normal in size and structure.  Venous: The inferior vena cava is normal in size with greater than 50% respiratory variability, suggesting right atrial pressure of 3 mmHg.  IAS/Shunts: No atrial level shunt detected by color flow Doppler.  Additional Comments: S/P AVR with mean gradient 7 mmHg, DI 0.63 and mild AI.   LEFT VENTRICLE PLAX 2D LVIDd:         4.50 cm   Diastology LVIDs:         3.20 cm   LV e' medial:    8.05 cm/s LV PW:         1.30 cm   LV E/e' medial:  15.4 LV IVS:        1.00 cm  LV e' lateral:   13.90 cm/s LVOT diam:     2.80 cm   LV E/e' lateral: 8.9 LV SV:         166 LV SV Index:   74 LVOT Area:      6.16 cm  3D Volume EF: 3D EF:        60 % LV EDV:       168 ml LV ESV:       68 ml LV SV:        100 ml  RIGHT VENTRICLE RV S prime:     6.96 cm/s TAPSE (M-mode): 1.6 cm  LEFT ATRIUM             Index        RIGHT ATRIUM           Index LA diam:        5.20 cm 2.31 cm/m   RA Area:     16.30 cm LA Vol (A2C):   67.4 ml 29.88 ml/m  RA Volume:   43.00 ml  19.06 ml/m LA Vol (A4C):   69.6 ml 30.86 ml/m LA Biplane Vol: 69.9 ml 30.99 ml/m AORTIC VALVE AV Area (Vmax):    4.21 cm AV Area (Vmean):   3.92 cm AV Area (VTI):     3.90 cm AV Vmax:           183.50 cm/s AV Vmean:          125.000 cm/s AV VTI:            0.426 m AV Peak Grad:      13.5 mmHg AV Mean Grad:      7.5 mmHg LVOT Vmax:         125.50 cm/s LVOT Vmean:        79.600 cm/s LVOT VTI:          0.270 m LVOT/AV VTI ratio: 0.63 AI PHT:            381 msec  AORTA Ao Root diam: 3.40 cm Ao Asc diam:  3.80 cm  MITRAL VALVE                TRICUSPID VALVE MV Area (PHT): cm          TR Peak grad:   17.6 mmHg MV Decel Time: 217 msec     TR Vmax:        210.00 cm/s MV E velocity: 124.00 cm/s MV A velocity: 84.45 cm/s   SHUNTS MV E/A ratio:  1.47         Systemic VTI:  0.27 m Systemic Diam: 2.80 cm  Olga Millers MD Electronically signed by Olga Millers MD Signature Date/Time: 11/04/2021/12:05:01 PM    Final   TEE  ECHO INTRAOPERATIVE TEE 09/16/2021  Narrative *INTRAOPERATIVE TRANSESOPHAGEAL REPORT *    Patient Name:   Righteous T Roundy Date of Exam: 09/16/2021 Medical Rec #:  [id number]        Height:       70.0 in Accession #:    [id number]       Weight:       248.0 lb Date of Birth:  1971-04-20        BSA:          2.29 m Patient Age:    50 years         BP:           108/56 mmHg Patient Gender: M  HR:           64 bpm. Exam Location:  Inpatient  Transesophogeal exam was perform intraoperatively during surgical procedure. Patient was closely monitored under general anesthesia during  the entirety of examination.  Indications:     AVR Performing Phys: [id number] BARTLE Diagnosing Phys: Dorris Singh MD  Complications: No known complications during this procedure. POST-OP IMPRESSIONS _ Right Ventricle: The right ventricle appears unchanged from pre-bypass. _ Comments: Post Op AVR Patient came off bypass on the initial attempt. There did not appear to be any obstruction to flow. The valve appeared to be seated well. Left ventricular contraction did improve with volume replacement. The TEE that had been placed after induction uneventfully was removed at the end of the case without difficulty. The patient was taken to to SICU in stable condition.  PRE-OP FINDINGS Left Ventricle: The left ventricle has low normal systolic function, with an ejection fraction of 50-55%.   Right Ventricle: The right ventricle has normal systolic function.  Mitral Valve: The mitral valve is normal in structure.  Tricuspid Valve: Tricuspid valve regurgitation is mild by color flow Doppler.  Aortic Valve: Aortic valve regurgitation is mild by color flow Doppler. There is severe stenosis of the aortic valve.    Dorris Singh MD Electronically signed by Dorris Singh MD Signature Date/Time: 09/16/2021/4:25:51 PM   Final   MONITORS  LONG TERM MONITOR (3-14 DAYS) 08/14/2022  Narrative   Reassuring monitor   No atrial fibrillation or other adverse rhythm   Sensation of fluttering or racing was normal sinus rhythm   Patch Wear Time:  13 days and 23 hours (2023-11-09T10:15:53-0500 to 2023-11-23T10:15:49-0500)  Patient had a min HR of 44 bpm, max HR of 156 bpm, and avg HR of 77 bpm. Predominant underlying rhythm was Sinus Rhythm. 1 run of Supraventricular Tachycardia occurred lasting 7 beats with a max rate of 156 bpm (avg 148 bpm). Isolated SVEs were rare (<1.0%), SVE Couplets were rare (<1.0%), and SVE Triplets were rare (<1.0%). Isolated VEs were rare (<1.0%, 442), VE Couplets  were rare (<1.0%, 5), and VE Triplets were rare (<1.0%, 1).           Echo 11/04/2021: IMPRESSIONS    1. S/P AVR with mean gradient 7 mmHg, DI 0.63 and mild AI.   2. Left ventricular ejection fraction, by estimation, is 50 to 55%. The  left ventricle has low normal function. The left ventricle has no regional  wall motion abnormalities. There is mild left ventricular hypertrophy.  Left ventricular diastolic  parameters were normal. Elevated left atrial pressure.   3. Right ventricular systolic function is normal. The right ventricular  size is normal. Tricuspid regurgitation signal is inadequate for assessing  PA pressure.   4. The mitral valve is normal in structure. Mild mitral valve  regurgitation. No evidence of mitral stenosis.   5. The aortic valve has been repaired/replaced. Aortic valve  regurgitation is mild. No aortic stenosis is present. There is a 23 mm  ON-X mechanical valve valve present in the aortic position. Procedure  Date: 09/16/21.   6. The inferior vena cava is normal in size with greater than 50%  respiratory variability, suggesting right atrial pressure of 3 mmHg.   FINDINGS   Left Ventricle: Left ventricular ejection fraction, by estimation, is 50  to 55%. The left ventricle has low normal function. The left ventricle has  no regional wall motion abnormalities. The left ventricular internal  cavity size was normal in size.  There is mild left ventricular hypertrophy. Left ventricular diastolic  parameters were normal. Elevated left atrial pressure.   Right Ventricle: The right ventricular size is normal. Right ventricular  systolic function is normal. Tricuspid regurgitation signal is inadequate  for assessing PA pressure. The tricuspid regurgitant velocity is 2.10 m/s,  and with an assumed right atrial   pressure of 3 mmHg, the estimated right ventricular systolic pressure is  20.6 mmHg.   Left Atrium: Left atrial size was normal in size.   Right  Atrium: Right atrial size was normal in size.   Pericardium: There is no evidence of pericardial effusion.   Mitral Valve: The mitral valve is normal in structure. Mild mitral valve  regurgitation. No evidence of mitral valve stenosis.   Tricuspid Valve: The tricuspid valve is normal in structure. Tricuspid  valve regurgitation is trivial. No evidence of tricuspid stenosis.   Aortic Valve: The aortic valve has been repaired/replaced. Aortic valve  regurgitation is mild. Aortic regurgitation PHT measures 381 msec. No  aortic stenosis is present. Aortic valve mean gradient measures 7.5 mmHg.  Aortic valve peak gradient measures  13.5 mmHg. Aortic valve area, by VTI measures 3.90 cm. There is a 23 mm  ON-X mechanical valve valve present in the aortic position. Procedure  Date: 09/16/21.   Pulmonic Valve: The pulmonic valve was normal in structure. Pulmonic valve  regurgitation is not visualized. No evidence of pulmonic stenosis.   Aorta: The aortic root is normal in size and structure.   Venous: The inferior vena cava is normal in size with greater than 50%  respiratory variability, suggesting right atrial pressure of 3 mmHg.   IAS/Shunts: No atrial level shunt detected by color flow Doppler.   Additional Comments: S/P AVR with mean gradient 7 mmHg, DI 0.63 and mild  AI.   DG Chest 10/17/2021: FINDINGS: The heart has decreased to normal size since the prior study. Intact median sternotomy sutures are again noted. There is an aortic valve prosthesis and old LAD coronary artery stenting.   No vascular congestion is evident. No pleural effusion is seen. Stable mediastinum. The lungs are clear. A reverse right shoulder arthroplasty is again shown.   IMPRESSION: No active cardiopulmonary disease.  Aortic valve replacement.  EKG: EKG is personally reviewed. 07/14/2022: Normal sinus rhythm 60 with incomplete right bundle branch block, normal PR interval 188 ms (and stopping Toprol).   Normal QT interval as well.  10/03/2021: Sinus rhythm with 1st degree AV block, 72 bpm  Recent Labs: 07/14/2022: TSH 2.720 10/01/2022: ALT 30 10/08/2022: BUN 11; Creatinine, Ser 0.97; Hemoglobin 11.6; Platelets 224; Potassium 4.0; Sodium 136  Recent Lipid Panel    Component Value Date/Time   CHOL 122 09/17/2022 1014   TRIG 132 09/17/2022 1014   HDL 38 (L) 09/17/2022 1014   CHOLHDL 3.2 09/17/2022 1014   CHOLHDL 2.6 03/27/2020 1111   VLDL 16.0 02/12/2015 0909   LDLCALC 61 09/17/2022 1014   LDLCALC 51 03/27/2020 1111   LDLDIRECT 71 10/03/2021 1507   LDLDIRECT 150.0 09/19/2013 1050     Risk Assessment/Calculations:              Physical Exam:    VS:  BP (!) 146/80   Pulse 74   Ht 5\' 10"  (1.778 m)   Wt 268 lb 3.2 oz (121.7 kg)   SpO2 99%   BMI 38.48 kg/m     Wt Readings from Last 3 Encounters:  01/23/23 268 lb 3.2 oz (121.7 kg)  10/07/22 260  lb (117.9 kg)  10/01/22 261 lb (118.4 kg)     GEN:  Well nourished, well developed in no acute distress HEENT: Normal NECK: No JVD; No carotid bruits LYMPHATICS: No lymphadenopathy CARDIAC: Sharp S2 click RRR, no murmurs, no rubs, gallops RESPIRATORY:  Clear to auscultation without rales, wheezing or rhonchi  ABDOMEN: Soft, non-tender, non-distended MUSCULOSKELETAL:  No edema; No deformity  SKIN: Warm and dry NEUROLOGIC:  Alert and oriented x 3 PSYCHIATRIC:  Normal affect   No change  ASSESSMENT:    1. S/P AVR (aortic valve replacement)   2. Coronary artery disease due to lipid rich plaque      PLAN:    In order of problems listed above:  Aortic valve replacement - Doing well with this.  Sharp click.  Last echocardiogram in February 2023 showed normal function.  I do not hear any significant murmurs on exam.  His INR is being closely followed at our [facility] Coumadin clinic location.  He does take some supplements, green leafy vegetables but he has been consistent with this.  Hip replacement - Dr. Magnus Ivan.    Coronary artery disease - Prior LAD stent.  Stable. Doing well. No angina   Continue to encourage weight loss.     Follow-up: 1 yr  Medication Adjustments/Labs and Tests Ordered: Current medicines are reviewed at length with the patient today.  Concerns regarding medicines are outlined above.  No orders of the defined types were placed in this encounter.  No orders of the defined types were placed in this encounter.   Patient Instructions  Medication Instructions:  Your physician recommends that you continue on your current medications as directed. Please refer to the Current Medication list given to you today.  *If you need a refill on your cardiac medications before your next appointment, please call your pharmacy*   Follow-Up: At [facility], you and your health needs are our priority.  As part of our continuing mission to provide you with exceptional heart care, we have created designated Provider Care Teams.  These Care Teams include your primary Cardiologist (physician) and Advanced Practice Providers (APPs -  Physician Assistants and Nurse Practitioners) who all work together to provide you with the care you need, when you need it.  Your next appointment:   1 year(s)  Provider:   Donato Schultz, MD        Signed, Donato Schultz, MD  01/23/2023 10:34 AM    [facility]

## 2023-01-23 NOTE — Patient Instructions (Signed)
Medication Instructions:  Your physician recommends that you continue on your current medications as directed. Please refer to the Current Medication list given to you today.  *If you need a refill on your cardiac medications before your next appointment, please call your pharmacy*   Follow-Up: At [facility], you and your health needs are our priority.  As part of our continuing mission to provide you with exceptional heart care, we have created designated Provider Care Teams.  These Care Teams include your primary Cardiologist (physician) and Advanced Practice Providers (APPs -  Physician Assistants and Nurse Practitioners) who all work together to provide you with the care you need, when you need it.   Your next appointment:   1 year(s)  Provider:   Mark Skains, MD     

## 2023-01-30 ENCOUNTER — Ambulatory Visit: Payer: No Typology Code available for payment source | Attending: Cardiology

## 2023-01-30 DIAGNOSIS — Z952 Presence of prosthetic heart valve: Secondary | ICD-10-CM

## 2023-01-30 DIAGNOSIS — Z5181 Encounter for therapeutic drug level monitoring: Secondary | ICD-10-CM

## 2023-01-30 LAB — POCT INR: INR: 1.7 — AB (ref 2.0–3.0)

## 2023-01-30 NOTE — Progress Notes (Signed)
done

## 2023-01-30 NOTE — Patient Instructions (Signed)
Continue taking warfarin 1.5 tablets daily except for 2 tablet on Sunday, Tuesday and Thursdays.  Recheck INR in 6 weeks. Anticoagulation Clinic: [phone]  Procedure Fax #[phone]

## 2023-02-21 ENCOUNTER — Other Ambulatory Visit: Payer: Self-pay | Admitting: Cardiology

## 2023-02-21 DIAGNOSIS — I480 Paroxysmal atrial fibrillation: Secondary | ICD-10-CM

## 2023-02-23 NOTE — Telephone Encounter (Signed)
Refill request for warfarin:  Last INR was 1.7 on 01/30/23 Next INR due 03/13/23 LOV was 01/23/23  Refill approved.

## 2023-02-26 ENCOUNTER — Encounter: Payer: Self-pay | Admitting: Orthopaedic Surgery

## 2023-02-26 ENCOUNTER — Ambulatory Visit: Payer: No Typology Code available for payment source | Admitting: Orthopaedic Surgery

## 2023-02-26 ENCOUNTER — Other Ambulatory Visit (INDEPENDENT_AMBULATORY_CARE_PROVIDER_SITE_OTHER): Payer: No Typology Code available for payment source

## 2023-02-26 DIAGNOSIS — Z96641 Presence of right artificial hip joint: Secondary | ICD-10-CM | POA: Diagnosis not present

## 2023-02-26 NOTE — Progress Notes (Signed)
The patient comes in today getting close 49-month status post a right total hip arthroplasty.  We replaced his left hip remotely.  He is only 52 years old.  He is doing great and reports good range of motion and strength.  He is walking without an assistive device.  He says his ADLs such as putting his shoes and socks on crossing his legs is improved significantly.  He has no issues with either hip.  On exam he is walking without a limp.  His ligaments are equal.  He tolerates me easily putting both hips for range of motion.  An AP pelvis and lateral of the more recent right hip shows a well-seated implants bilaterally with no complicating features.  This point follow-up for his hips can be as needed.  He has numbness for a long period time and he understands that if there is any issues orthopedically not to hesitate to come see [place].

## 2023-03-13 ENCOUNTER — Telehealth: Payer: Self-pay | Admitting: *Deleted

## 2023-03-13 ENCOUNTER — Ambulatory Visit: Payer: No Typology Code available for payment source

## 2023-03-13 NOTE — Telephone Encounter (Signed)
Received a voicemail from the pt stating her needs to cancel his appointment for the day. Returned a call to the pt and there was no answer, therefore, left a voicemail. Also, canceled today's appt as he advised of voicemail.

## 2023-03-24 ENCOUNTER — Ambulatory Visit: Payer: No Typology Code available for payment source | Attending: Cardiology | Admitting: *Deleted

## 2023-03-24 DIAGNOSIS — Z5181 Encounter for therapeutic drug level monitoring: Secondary | ICD-10-CM

## 2023-03-24 DIAGNOSIS — Z952 Presence of prosthetic heart valve: Secondary | ICD-10-CM

## 2023-03-24 LAB — POCT INR: INR: 1.7 — AB (ref 2.0–3.0)

## 2023-03-24 NOTE — Patient Instructions (Signed)
Description   Continue taking warfarin 1.5 tablets daily except for 2 tablet on Sunday, Tuesday and Thursdays. Recheck INR in 6 weeks. Anticoagulation Clinic: [phone]  Procedure Fax #[phone]

## 2023-05-05 ENCOUNTER — Ambulatory Visit: Payer: No Typology Code available for payment source | Attending: Cardiovascular Disease | Admitting: *Deleted

## 2023-05-05 DIAGNOSIS — Z952 Presence of prosthetic heart valve: Secondary | ICD-10-CM | POA: Diagnosis not present

## 2023-05-05 DIAGNOSIS — Z5181 Encounter for therapeutic drug level monitoring: Secondary | ICD-10-CM

## 2023-05-05 LAB — POCT INR: POC INR: 1.9

## 2023-05-05 NOTE — Patient Instructions (Signed)
Description   Continue taking warfarin 1.5 tablets daily except for 2 tablet on Sunday, Tuesday and Thursdays. Recheck INR in 6 weeks. Anticoagulation Clinic: [phone]  Procedure Fax #[phone]

## 2023-05-07 ENCOUNTER — Emergency Department (HOSPITAL_COMMUNITY)
Admission: EM | Admit: 2023-05-07 | Discharge: 2023-05-07 | Disposition: A | Discharge status: Home / Self Care | Payer: No Typology Code available for payment source | Attending: Emergency Medicine | Admitting: Emergency Medicine

## 2023-05-07 ENCOUNTER — Encounter (HOSPITAL_COMMUNITY): Payer: Self-pay

## 2023-05-07 DIAGNOSIS — W269XXA Contact with unspecified sharp object(s), initial encounter: Secondary | ICD-10-CM | POA: Diagnosis not present

## 2023-05-07 DIAGNOSIS — Y93G1 Activity, food preparation and clean up: Secondary | ICD-10-CM | POA: Insufficient documentation

## 2023-05-07 DIAGNOSIS — Z7982 Long term (current) use of aspirin: Secondary | ICD-10-CM | POA: Insufficient documentation

## 2023-05-07 DIAGNOSIS — Z7901 Long term (current) use of anticoagulants: Secondary | ICD-10-CM | POA: Insufficient documentation

## 2023-05-07 DIAGNOSIS — S61216A Laceration without foreign body of right little finger without damage to nail, initial encounter: Secondary | ICD-10-CM | POA: Insufficient documentation

## 2023-05-07 MED ORDER — LIDOCAINE HCL (PF) 1 % IJ SOLN
2.0000 mL | Freq: Once | INTRAMUSCULAR | Status: AC
  Administered 2023-05-07: 2 mL
  Filled 2023-05-07: qty 30

## 2023-05-07 MED ORDER — IBUPROFEN 800 MG PO TABS: 800.0000 mg | ORAL_TABLET | Freq: Three times a day (TID) | ORAL | 0 refills | Status: AC | PRN

## 2023-05-07 MED ORDER — OXYCODONE HCL 5 MG PO TABS: 5.0000 mg | ORAL_TABLET | Freq: Four times a day (QID) | ORAL | 0 refills | Status: AC | PRN

## 2023-05-07 NOTE — ED Triage Notes (Signed)
Pt presents with c/o right pinky finger laceration. Pt reports he was doing dishes and cut his finger. Lac to the center of his pinky finger, bleeding controlled.

## 2023-05-07 NOTE — ED Provider Notes (Signed)
[place] EMERGENCY DEPARTMENT AT [facility] Provider Note   CSN: [id number] Arrival date & time: 05/07/23  0865     History  Chief Complaint  Patient presents with   Extremity Laceration    Gabriel Anderson is a 52 y.o. male.  Patient is a 52 year old man presenting for laceration to his right pinky finger while doing the dishes.  Patient denies any sensation or motor dysfunction.  Finger continuously bleeding at this time.  The history is provided by the patient. No language interpreter was used.       Home Medications Prior to Admission medications   Medication Sig Start Date End Date Taking? Authorizing Provider  ibuprofen (ADVIL) 800 MG tablet Take 1 tablet (800 mg total) by mouth every 8 (eight) hours as needed for mild pain. 05/07/23  Yes Edwin Dada P, DO  oxyCODONE (ROXICODONE) 5 MG immediate release tablet Take 1 tablet (5 mg total) by mouth every 6 (six) hours as needed for up to 3 days for severe pain. 05/07/23 05/10/23 Yes Edwin Dada P, DO  acetaminophen (TYLENOL) 500 MG tablet Take 1,000 mg by mouth every 8 (eight) hours as needed for moderate pain.    [provider]  acidophilus (RISAQUAD) CAPS capsule Take 1 capsule by mouth daily.    [provider]  amoxicillin (AMOXIL) 500 MG tablet Take 2,000 mg by mouth See admin instructions. Take 2000 mg by mouth one hour prior to dental procedures 03/20/20   [provider]  aspirin 81 MG tablet Take 1 tablet (81 mg total) by mouth daily. 04/09/16   Jake Bathe, MD  Cholecalciferol (D3 SUPER STRENGTH PO) Take 1 tablet by mouth daily.    [provider]  nitroGLYCERIN (NITROSTAT) 0.4 MG SL tablet Place 1 tablet (0.4 mg total) under the tongue every 5 (five) minutes as needed. Max of 3 doses, then 911 12/16/21   Jake Bathe, MD  oxyCODONE-acetaminophen (PERCOCET/ROXICET) 5-325 MG tablet Take 1-2 tablets by mouth every 6 (six) hours as needed for severe pain. 11/14/22   Kirtland Bouchard, PA-C  rosuvastatin (CRESTOR) 40 MG tablet Take 20 mg by mouth daily.    [provider]  tiZANidine (ZANAFLEX) 4 MG tablet Take 1 tablet (4 mg total) by mouth every 6 (six) hours as needed for muscle spasms. 11/17/22   Kathryne Hitch, MD  traMADol (ULTRAM) 50 MG tablet Take 2 tablets (100 mg total) by mouth 3 (three) times daily as needed. 12/15/22   Kathryne Hitch, MD  warfarin (COUMADIN) 5 MG tablet TAKE 2 TABLETS BY MOUTH DAILY OR AS INSTRUCTED BY THE ANTICOAGULATION CLINIC. 02/23/23   Jake Bathe, MD      Allergies    Patient has no known allergies.    Review of Systems   Review of Systems  Constitutional:  Negative for chills and fever.  Skin:  Positive for wound. Negative for color change.  Neurological:  Negative for weakness and numbness.    Physical Exam Updated Vital Signs BP (!) 172/91 (BP Location: Left Arm)   Pulse 75   Temp 98.3 F (36.8 C) (Oral)   Resp 18   SpO2 100%  Physical Exam Vitals and nursing note reviewed.  Constitutional:      Appearance: Normal appearance.  HENT:     Head: Normocephalic and atraumatic.  Cardiovascular:     Rate and Rhythm: Normal rate and regular rhythm.     Pulses:  Radial pulses are 2+ on the right side.  Pulmonary:     Effort: Pulmonary effort is normal.  Skin:    Capillary Refill: Capillary refill takes less than 2 seconds.       Neurological:     General: No focal deficit present.     Mental Status: He is alert.     Sensory: Sensation is intact.     Motor: Motor function is intact.     ED Results / Procedures / Treatments   Labs (all labs ordered are listed, but only abnormal results are displayed) Labs Reviewed - No data to display  EKG None  Radiology No results found.  Procedures .Nerve Block  Date/Time: 05/07/2023 9:53 AM  Performed by: Franne Forts, DO Authorized by: Franne Forts, DO   Consent:    Consent obtained:  Verbal   Consent given by:   Patient   Risks, benefits, and alternatives were discussed: yes     Risks discussed:  Infection, pain, swelling and bleeding   Alternatives discussed:  No treatment Universal protocol:    Immediately prior to procedure, a time out was called: yes     Patient identity confirmed:  Verbally with patient, arm band and provided demographic data Indications:    Indications:  Pain relief Location:    Nerve block body site: right pinky.   Laterality:  Right Pre-procedure details:    Skin preparation:  Alcohol   Preparation: Patient was prepped and draped in usual sterile fashion   Procedure details:    Anesthetic injected:  Lidocaine 1% w/o epi Post-procedure details:    Procedure completion:  Tolerated well, no immediate complications .Marland KitchenLaceration Repair  Date/Time: 05/07/2023 9:54 AM  Performed by: Franne Forts, DO Authorized by: Franne Forts, DO   Consent:    Consent obtained:  Verbal   Consent given by:  Patient   Risks, benefits, and alternatives were discussed: yes     Risks discussed:  Infection, need for additional repair and nerve damage   Alternatives discussed:  No treatment Universal protocol:    Immediately prior to procedure, a time out was called: yes     Patient identity confirmed:  Verbally with patient, arm band and provided demographic data Anesthesia:    Anesthesia method:  Nerve block Laceration details:    Location: right small finger.   Length (cm):  2.5 Treatment:    Amount of cleaning:  Extensive   Irrigation solution:  Tap water Skin repair:    Repair method:  Sutures   Number of sutures:  7 Approximation:    Approximation:  Close Repair type:    Repair type:  Simple Post-procedure details:    Procedure completion:  Tolerated well, no immediate complications     Medications Ordered in ED Medications  lidocaine (PF) (XYLOCAINE) 1 % injection 2 mL (2 mLs Infiltration Given by Other 05/07/23 0941)    ED Course/ Medical Decision Making/ A&P                                  Medical Decision Making Risk Prescription drug management.   9:55 AM Patient is a 52 year old man presenting for laceration to his right pinky finger while doing the dishes.  On exam patient is alert and oriented, finger is neurovascular intact, Nerve block applied without any acute complications.  Patient received approximately 7 interrupted sutures.  Recommended for follow-up with PCP in the  next 1 to 10 days for suture removal.  Wound care instructions given.  Patient in no distress and overall condition improved here in the ED. Detailed discussions were had with the patient regarding current findings, and need for close f/u with PCP or on call doctor. The patient has been instructed to return immediately if the symptoms worsen in any way for re-evaluation. Patient verbalized understanding and is in agreement with current care plan. All questions answered prior to discharge.           Final Clinical Impression(s) / ED Diagnoses Final diagnoses:  Laceration of right little finger without foreign body without damage to nail, initial encounter    Rx / DC Orders ED Discharge Orders          Ordered    ibuprofen (ADVIL) 800 MG tablet  Every 8 hours PRN        05/07/23 0941    oxyCODONE (ROXICODONE) 5 MG immediate release tablet  Every 6 hours PRN        05/07/23 0947              Franne Forts, DO 05/07/23 [phone]

## 2023-05-07 NOTE — Discharge Instructions (Signed)
Please follow up with primary care in the next 7-10 days for suture removal.

## 2023-06-16 ENCOUNTER — Ambulatory Visit: Payer: No Typology Code available for payment source | Attending: Cardiovascular Disease | Admitting: *Deleted

## 2023-06-16 DIAGNOSIS — Z952 Presence of prosthetic heart valve: Secondary | ICD-10-CM

## 2023-06-16 DIAGNOSIS — Z5181 Encounter for therapeutic drug level monitoring: Secondary | ICD-10-CM | POA: Diagnosis not present

## 2023-06-16 LAB — POCT INR: INR: 1.9 — AB (ref 2.0–3.0)

## 2023-06-16 NOTE — Patient Instructions (Signed)
Description   Continue taking warfarin 1.5 tablets daily except for 2 tablet on Sunday, Tuesday and Thursdays. Recheck INR in 6 weeks. Anticoagulation Clinic: [phone]  Procedure Fax #[phone]

## 2023-06-22 ENCOUNTER — Encounter: Payer: Self-pay | Admitting: Cardiology

## 2023-06-22 MED ORDER — ROSUVASTATIN CALCIUM 40 MG PO TABS: 20.0000 mg | ORAL_TABLET | Freq: Every day | ORAL | 2 refills | Status: DC

## 2023-07-28 ENCOUNTER — Ambulatory Visit: Payer: No Typology Code available for payment source | Attending: Cardiology

## 2023-07-28 DIAGNOSIS — Z952 Presence of prosthetic heart valve: Secondary | ICD-10-CM

## 2023-07-28 DIAGNOSIS — Z5181 Encounter for therapeutic drug level monitoring: Secondary | ICD-10-CM

## 2023-07-28 LAB — POCT INR: INR: 1.1 — AB (ref 2.0–3.0)

## 2023-07-28 NOTE — Patient Instructions (Signed)
TAKE 3 TABLETS TODAY ONLY THEN Continue taking warfarin 1.5 tablets daily except for 2 tablet on Sunday, Tuesday and Thursdays. Recheck INR in 2 weeks. Anticoagulation Clinic: [phone]  Procedure Fax #[phone]

## 2023-08-11 ENCOUNTER — Other Ambulatory Visit: Payer: Self-pay | Admitting: *Deleted

## 2023-08-11 ENCOUNTER — Ambulatory Visit: Payer: No Typology Code available for payment source | Attending: Cardiology | Admitting: *Deleted

## 2023-08-11 DIAGNOSIS — Z952 Presence of prosthetic heart valve: Secondary | ICD-10-CM

## 2023-08-11 DIAGNOSIS — I480 Paroxysmal atrial fibrillation: Secondary | ICD-10-CM

## 2023-08-11 DIAGNOSIS — Z5181 Encounter for therapeutic drug level monitoring: Secondary | ICD-10-CM | POA: Diagnosis not present

## 2023-08-11 LAB — POCT INR: INR: 1.6 — AB (ref 2.0–3.0)

## 2023-08-11 MED ORDER — WARFARIN SODIUM 5 MG PO TABS: ORAL_TABLET | ORAL | 1 refills | Status: DC

## 2023-08-11 NOTE — Patient Instructions (Addendum)
Description   Continue taking warfarin 1.5 tablets daily except for 2 tablets on Sundays, Tuesdays, and Thursdays. Recheck INR in 4 weeks. Anticoagulation Clinic: [phone]  Procedure Fax #[phone]

## 2023-08-11 NOTE — Telephone Encounter (Signed)
Warfarin 5mg  refill S/p AVR Last INR 08/11/23 Last OV 01/23/23

## 2023-09-08 ENCOUNTER — Ambulatory Visit: Payer: No Typology Code available for payment source | Attending: Cardiovascular Disease | Admitting: *Deleted

## 2023-09-08 DIAGNOSIS — Z5181 Encounter for therapeutic drug level monitoring: Secondary | ICD-10-CM

## 2023-09-08 DIAGNOSIS — Z952 Presence of prosthetic heart valve: Secondary | ICD-10-CM

## 2023-09-08 LAB — POCT INR: INR: 1.4 — AB (ref 2.0–3.0)

## 2023-09-08 NOTE — Patient Instructions (Signed)
 Description   Today take 2.5 tablets of warfarin then continue taking warfarin 1.5 tablets daily except for 2 tablets on Sundays, Tuesdays, and Thursdays. Recheck INR in 4 weeks. Anticoagulation Clinic: [phone]  Procedure Fax #[phone]

## 2023-09-11 IMAGING — CR DG CHEST 2V
2 series · 2 of 2 positions shown · non-contrast
Comparison: Portable chest 09/18/2021.

CLINICAL DATA: Aortic valve replacement.

EXAM:
CHEST - 2 VIEW

[w chest pa]
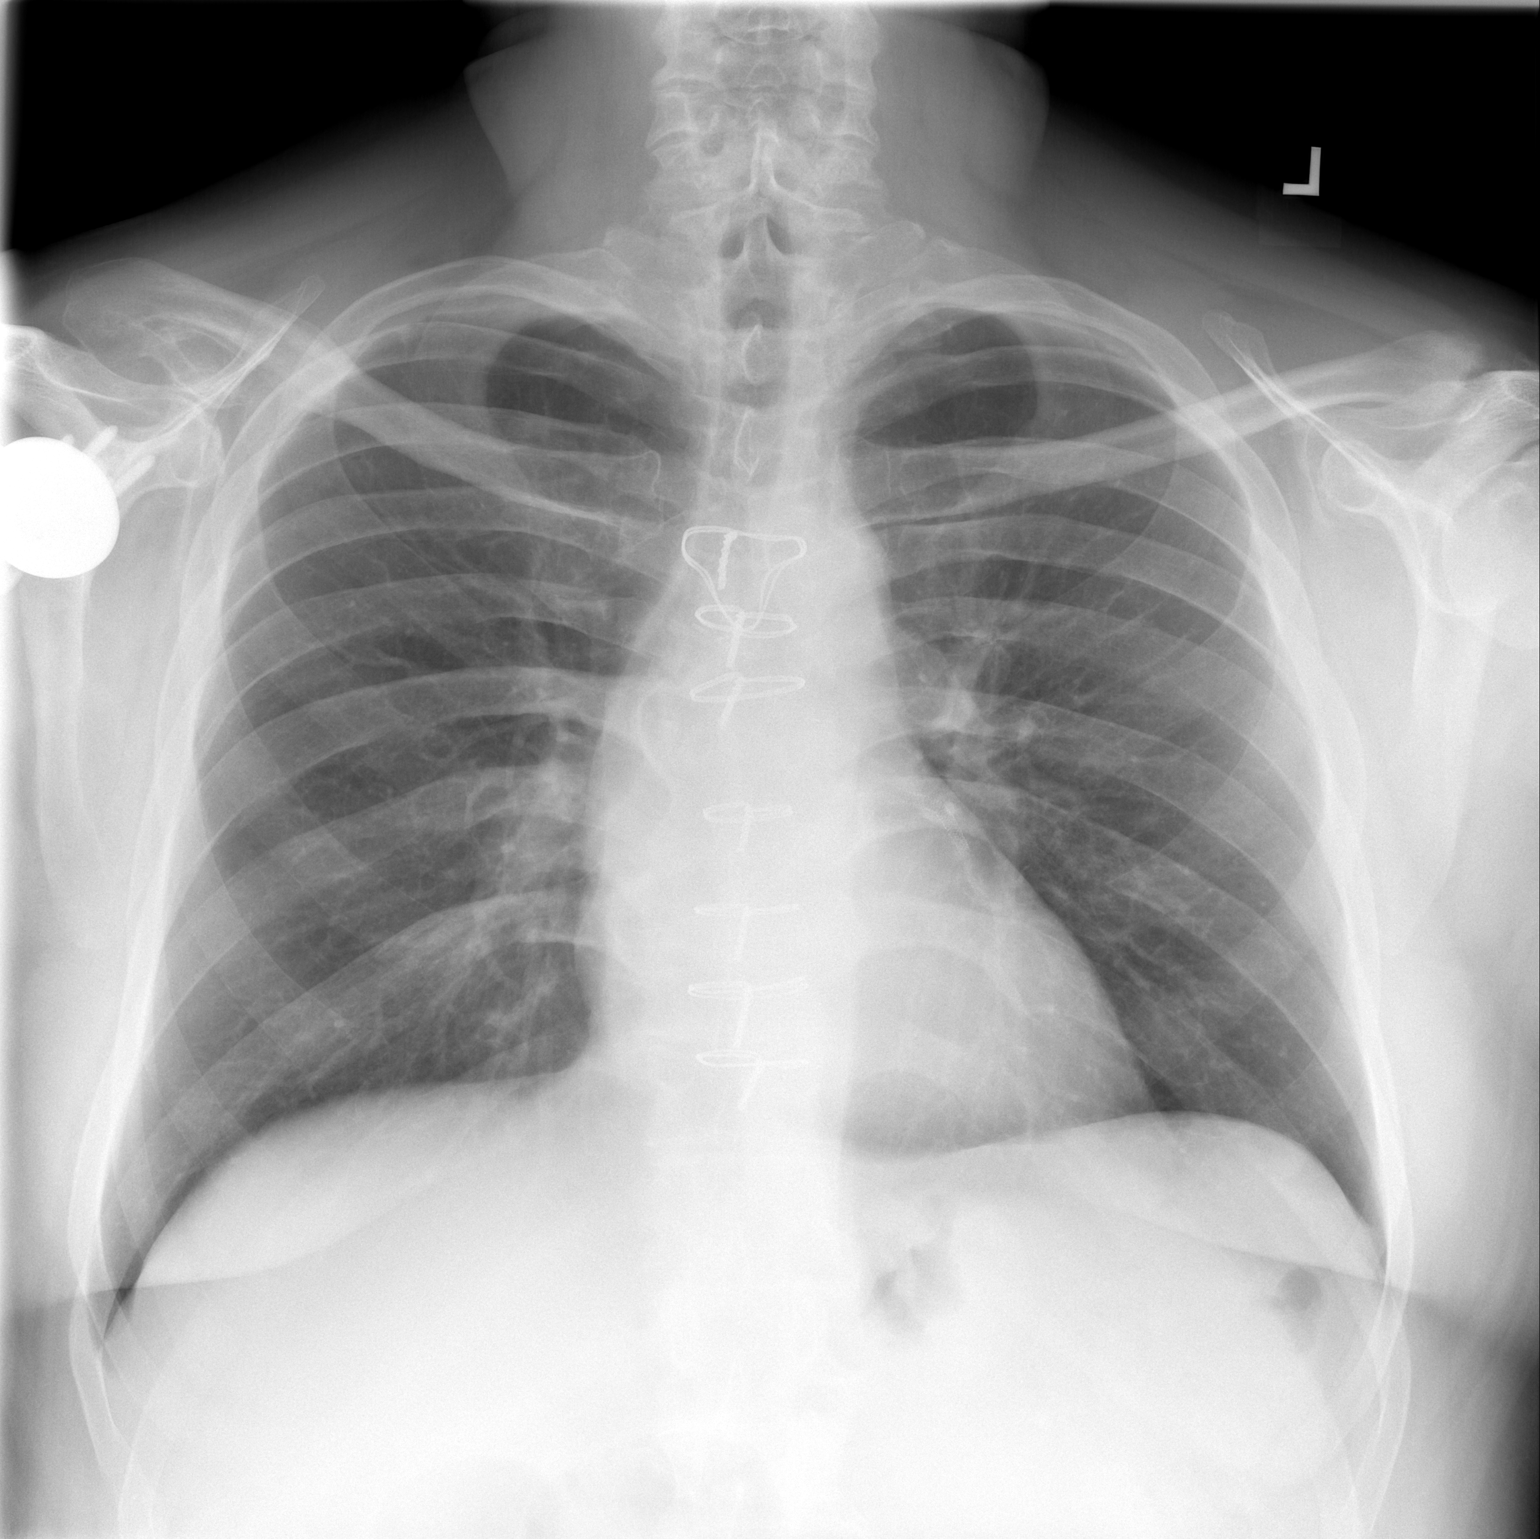

[w chest lat]
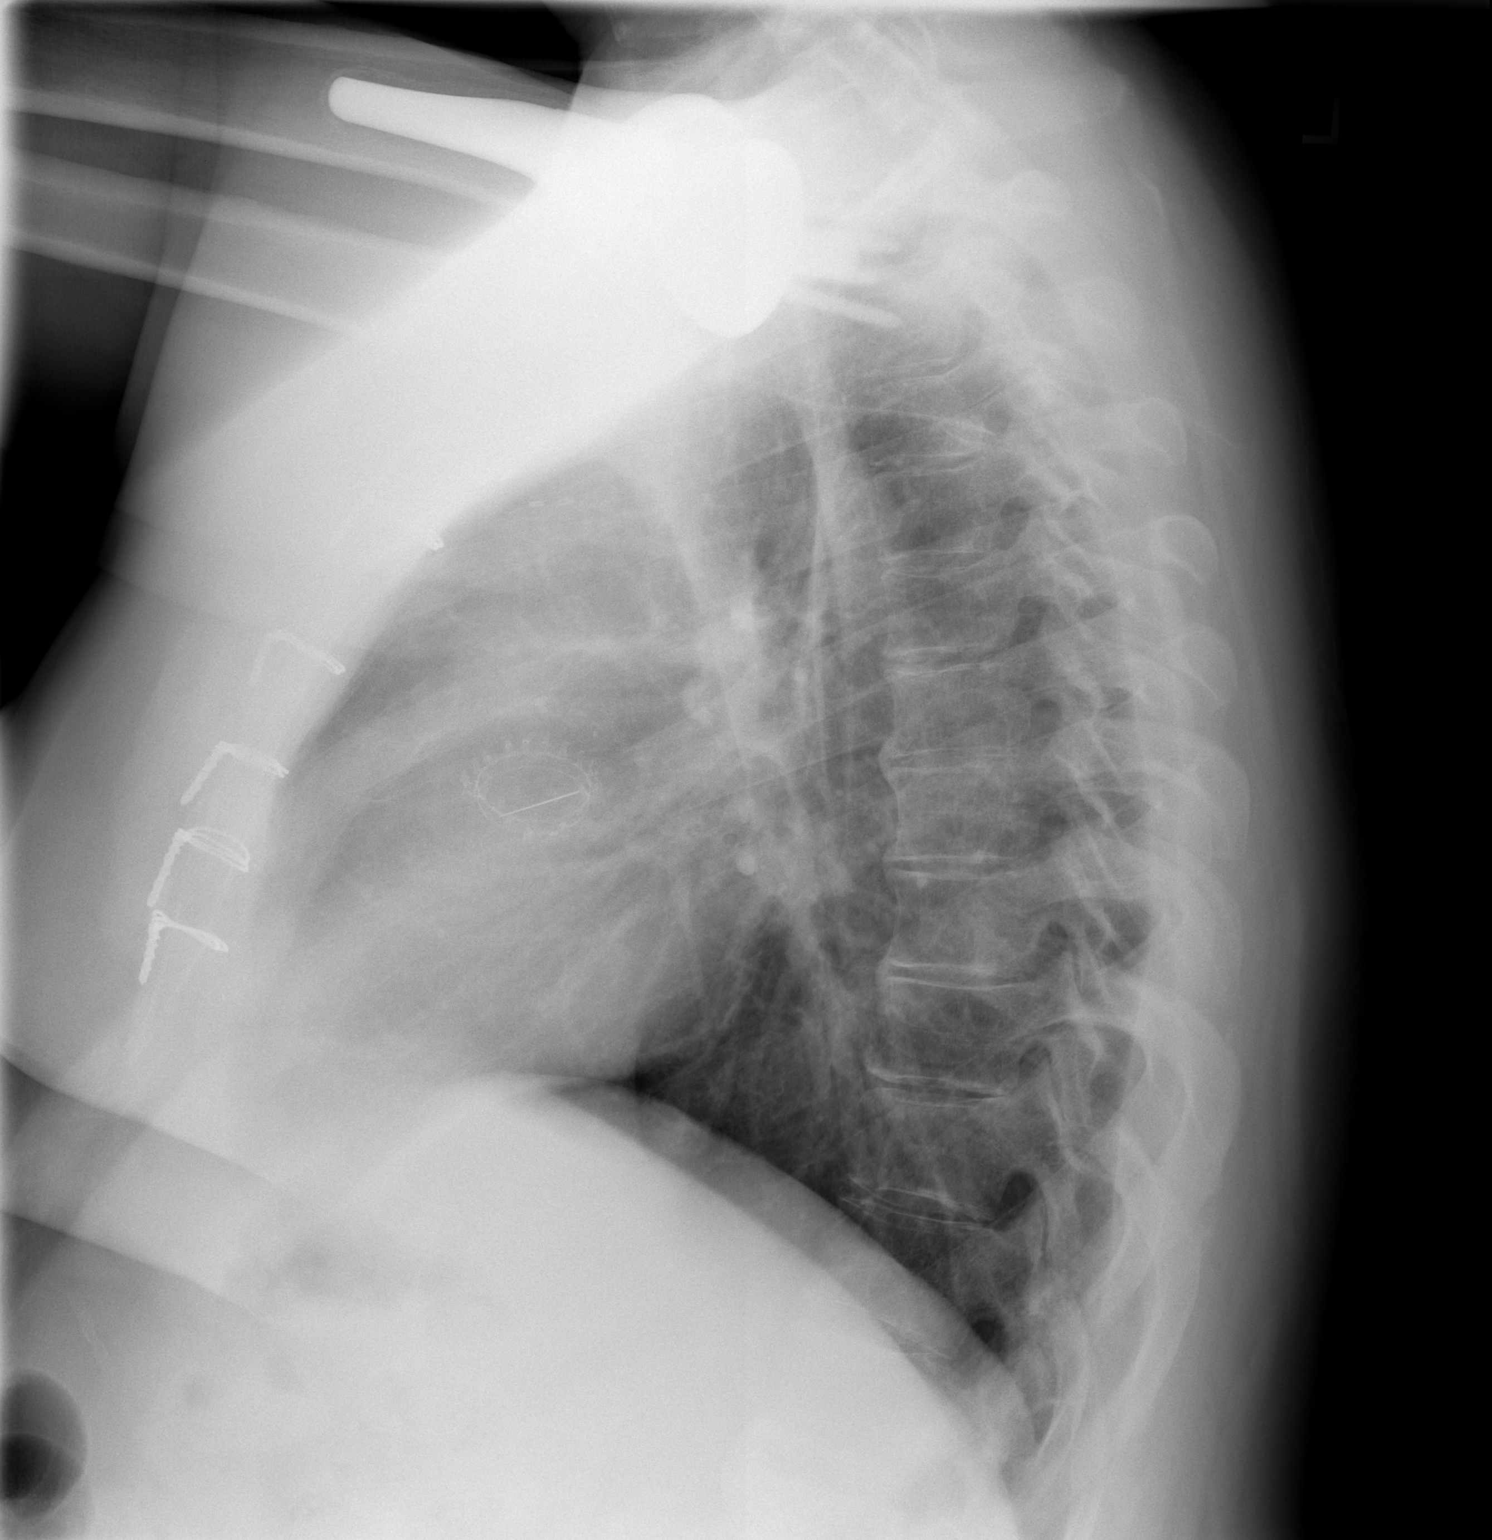

[2 of 2 positions shown; findings below may reference images not displayed]

FINDINGS: The heart has decreased to normal size since the prior study. Intact
median sternotomy sutures are again noted. There is an aortic valve
prosthesis and old LAD coronary artery stenting.

No vascular congestion is evident. No pleural effusion is seen.
Stable mediastinum. The lungs are clear. A reverse right shoulder
arthroplasty is again shown.
IMPRESSION: No active cardiopulmonary disease.  Aortic valve replacement.

## 2023-10-06 ENCOUNTER — Ambulatory Visit: Payer: No Typology Code available for payment source | Attending: Cardiology | Admitting: *Deleted

## 2023-10-06 DIAGNOSIS — Z5181 Encounter for therapeutic drug level monitoring: Secondary | ICD-10-CM | POA: Diagnosis not present

## 2023-10-06 DIAGNOSIS — Z952 Presence of prosthetic heart valve: Secondary | ICD-10-CM | POA: Diagnosis not present

## 2023-10-06 LAB — POCT INR: INR: 2.2 (ref 2.0–3.0)

## 2023-10-06 NOTE — Patient Instructions (Signed)
Description   Today take 1.5 tablets of warfarin then continue taking warfarin 1.5 tablets daily except for 2 tablets on Sundays, Tuesdays, and Thursdays. Recheck INR in 4 weeks. Anticoagulation Clinic: [phone]  Procedure Fax #[phone]

## 2023-11-03 ENCOUNTER — Ambulatory Visit: Payer: No Typology Code available for payment source | Attending: Cardiology

## 2023-11-03 DIAGNOSIS — Z5181 Encounter for therapeutic drug level monitoring: Secondary | ICD-10-CM | POA: Diagnosis not present

## 2023-11-03 DIAGNOSIS — Z952 Presence of prosthetic heart valve: Secondary | ICD-10-CM | POA: Diagnosis not present

## 2023-11-03 LAB — POCT INR: INR: 1.7 — AB (ref 2.0–3.0)

## 2023-11-03 NOTE — Patient Instructions (Signed)
 continue taking warfarin 1.5 tablets daily except for 2 tablets on Sundays, Tuesdays, and Thursdays. Recheck INR in 4 weeks. Anticoagulation Clinic: [phone]  Procedure Fax #[phone]

## 2023-12-01 ENCOUNTER — Telehealth: Payer: Self-pay | Admitting: *Deleted

## 2023-12-01 ENCOUNTER — Ambulatory Visit: Payer: No Typology Code available for payment source

## 2023-12-01 NOTE — Telephone Encounter (Signed)
 Called pt since he missed appt today; there was no answer so left a message to call back.

## 2023-12-07 ENCOUNTER — Ambulatory Visit: Attending: Cardiology

## 2023-12-07 DIAGNOSIS — Z952 Presence of prosthetic heart valve: Secondary | ICD-10-CM | POA: Diagnosis not present

## 2023-12-07 DIAGNOSIS — Z5181 Encounter for therapeutic drug level monitoring: Secondary | ICD-10-CM | POA: Diagnosis not present

## 2023-12-07 LAB — POCT INR: INR: 1.8 — AB (ref 2.0–3.0)

## 2023-12-07 NOTE — Patient Instructions (Signed)
 continue taking warfarin 1.5 tablets daily except for 2 tablets on Sundays, Tuesdays, and Thursdays. Recheck INR in 6 weeks. Anticoagulation Clinic: [phone]  Procedure Fax #[phone]

## 2024-01-08 ENCOUNTER — Other Ambulatory Visit (HOSPITAL_COMMUNITY): Payer: Self-pay

## 2024-01-18 ENCOUNTER — Ambulatory Visit: Payer: Self-pay | Attending: Cardiology

## 2024-01-18 DIAGNOSIS — Z5181 Encounter for therapeutic drug level monitoring: Secondary | ICD-10-CM

## 2024-01-18 DIAGNOSIS — Z952 Presence of prosthetic heart valve: Secondary | ICD-10-CM | POA: Diagnosis not present

## 2024-01-18 LAB — POCT INR: INR: 2.7 (ref 2.0–3.0)

## 2024-01-18 NOTE — Patient Instructions (Signed)
 Take 1 tablet today only continue taking warfarin 1.5 tablets daily except for 2 tablets on Sundays, Tuesdays, and Thursdays. Recheck INR in 6 weeks. Anticoagulation Clinic: [phone]  Procedure Fax #[phone]

## 2024-02-23 ENCOUNTER — Encounter: Payer: Self-pay | Admitting: Cardiology

## 2024-02-23 ENCOUNTER — Other Ambulatory Visit: Payer: Self-pay | Admitting: Cardiology

## 2024-02-24 ENCOUNTER — Other Ambulatory Visit: Payer: Self-pay

## 2024-02-24 DIAGNOSIS — I480 Paroxysmal atrial fibrillation: Secondary | ICD-10-CM

## 2024-02-24 MED ORDER — WARFARIN SODIUM 5 MG PO TABS: ORAL_TABLET | ORAL | 1 refills | Status: AC

## 2024-02-29 ENCOUNTER — Ambulatory Visit: Attending: Cardiology

## 2024-02-29 DIAGNOSIS — Z952 Presence of prosthetic heart valve: Secondary | ICD-10-CM

## 2024-02-29 DIAGNOSIS — Z5181 Encounter for therapeutic drug level monitoring: Secondary | ICD-10-CM

## 2024-02-29 LAB — POCT INR: INR: 1.6 — AB (ref 2.0–3.0)

## 2024-02-29 NOTE — Progress Notes (Signed)
Please see anticoagulation encounter.

## 2024-02-29 NOTE — Patient Instructions (Signed)
 continue taking warfarin 1.5 tablets daily except for 2 tablets on Sundays, Tuesdays, and Thursdays. Recheck INR in 6 weeks. Anticoagulation Clinic: [phone]  Procedure Fax #[phone]

## 2024-03-31 ENCOUNTER — Other Ambulatory Visit: Payer: Self-pay | Admitting: Cardiology

## 2024-04-11 ENCOUNTER — Ambulatory Visit: Attending: Cardiology | Admitting: *Deleted

## 2024-04-11 DIAGNOSIS — Z5181 Encounter for therapeutic drug level monitoring: Secondary | ICD-10-CM | POA: Diagnosis not present

## 2024-04-11 DIAGNOSIS — Z952 Presence of prosthetic heart valve: Secondary | ICD-10-CM

## 2024-04-11 LAB — POCT INR: INR: 3 (ref 2.0–3.0)

## 2024-04-11 NOTE — Patient Instructions (Addendum)
 Description   Do not take any warfarin today then continue taking warfarin 1.5 tablets daily except for 2 tablets on Sundays, Tuesdays, and Thursdays. Recheck INR in 5 weeks. Anticoagulation Clinic: [phone]  Procedure Fax #[phone]

## 2024-04-11 NOTE — Progress Notes (Signed)
 INR 3.0; Please see anticoagulation encounter

## 2024-05-17 ENCOUNTER — Ambulatory Visit: Attending: Cardiology | Admitting: Pharmacist

## 2024-05-17 DIAGNOSIS — I480 Paroxysmal atrial fibrillation: Secondary | ICD-10-CM | POA: Diagnosis not present

## 2024-05-17 DIAGNOSIS — Z5181 Encounter for therapeutic drug level monitoring: Secondary | ICD-10-CM | POA: Diagnosis not present

## 2024-05-17 DIAGNOSIS — Z952 Presence of prosthetic heart valve: Secondary | ICD-10-CM

## 2024-05-17 LAB — POCT INR: INR: 3.5 — AB (ref 2.0–3.0)

## 2024-05-17 NOTE — Patient Instructions (Signed)
 Description   Do not take any warfarin today or tomorrow and then continue taking warfarin 1.5 tablets daily except for 2 tablets on Sundays, Tuesdays, and Thursdays. Recheck INR in 3 weeks. Anticoagulation Clinic: [phone]  Procedure Fax #[phone]

## 2024-05-17 NOTE — Progress Notes (Signed)
 INR 3.5; Please see anticoagulation encounter   Description   Do not take any warfarin today or tomorrow and then continue taking warfarin 1.5 tablets daily except for 2 tablets on Sundays, Tuesdays, and Thursdays. Recheck INR in 3 weeks. Anticoagulation Clinic: [phone]  Procedure Fax #[phone]

## 2024-06-07 ENCOUNTER — Ambulatory Visit

## 2024-06-10 ENCOUNTER — Ambulatory Visit: Attending: Cardiology

## 2024-06-10 DIAGNOSIS — Z952 Presence of prosthetic heart valve: Secondary | ICD-10-CM

## 2024-06-10 DIAGNOSIS — Z5181 Encounter for therapeutic drug level monitoring: Secondary | ICD-10-CM | POA: Diagnosis not present

## 2024-06-10 LAB — POCT INR: INR: 1.5 — AB (ref 2.0–3.0)

## 2024-06-10 NOTE — Progress Notes (Signed)
 INR 1.5 Please see anticoagulation encounter  continue taking warfarin 1.5 tablets daily except for 2 tablets on Sundays, Tuesdays, and Thursdays. Recheck INR in 6 weeks. Anticoagulation Clinic: [phone]  Procedure Fax #[phone]

## 2024-06-10 NOTE — Patient Instructions (Signed)
 continue taking warfarin 1.5 tablets daily except for 2 tablets on Sundays, Tuesdays, and Thursdays. Recheck INR in 6 weeks. Anticoagulation Clinic: [phone]  Procedure Fax #[phone]

## 2024-07-22 ENCOUNTER — Ambulatory Visit: Attending: Cardiology

## 2024-07-22 DIAGNOSIS — Z5181 Encounter for therapeutic drug level monitoring: Secondary | ICD-10-CM

## 2024-07-22 DIAGNOSIS — Z952 Presence of prosthetic heart valve: Secondary | ICD-10-CM

## 2024-07-22 LAB — POCT INR: INR: 1.8 — AB (ref 2.0–3.0)

## 2024-07-22 NOTE — Patient Instructions (Signed)
 continue taking warfarin 1.5 tablets daily except for 2 tablets on Sundays, Tuesdays, and Thursdays. Recheck INR in 7 weeks. Anticoagulation Clinic: [phone]  Procedure Fax #[phone]

## 2024-07-22 NOTE — Progress Notes (Signed)
 INR 1.8 Please see anticoagulation encounter  continue taking warfarin 1.5 tablets daily except for 2 tablets on Sundays, Tuesdays, and Thursdays. Recheck INR in 7 weeks. Anticoagulation Clinic: [phone]  Procedure Fax #[phone]

## 2024-08-24 ENCOUNTER — Encounter: Payer: Self-pay | Admitting: Cardiology

## 2024-08-25 ENCOUNTER — Telehealth (HOSPITAL_BASED_OUTPATIENT_CLINIC_OR_DEPARTMENT_OTHER): Payer: Self-pay | Admitting: *Deleted

## 2024-08-25 NOTE — Telephone Encounter (Signed)
° °  Name: Gabriel Anderson  DOB: 06-30-1971  MRN: [id number]  Primary Cardiologist: Oneil Parchment, MD  Chart reviewed as part of pre-operative protocol coverage. Because of Gabriel Anderson's past medical history and time since last visit, he will require a follow-up in-office visit in order to better assess preoperative cardiovascular risk.  Pre-op covering staff: - Please schedule appointment and call patient to inform them. If patient already had an upcoming appointment within acceptable timeframe, please add pre-op clearance to the appointment notes so provider is aware. - Please contact requesting surgeon's office via preferred method (i.e, phone, fax) to inform them of need for appointment prior to surgery.  This message will also be routed to pharmacy pool for input on holding coumadin  as requested below so that this information is available to the clearing provider at time of patient's appointment.   SBE prophylaxis is required for the patient from a cardiac standpoint.  Gabriel Hila, NP  08/25/2024, 12:32 PM

## 2024-08-25 NOTE — Telephone Encounter (Signed)
 Called pt to schedule. Pt was confused. He said he thought he was only coming in for a coumadin  adjustment, not aware of a preop clearance. In the middle of the conversation, we lost connection. When I tried to call back, went straight to vm. [email or web address]

## 2024-08-25 NOTE — Telephone Encounter (Signed)
° °  Pre-operative Risk Assessment    Patient Name: Gabriel Anderson  DOB: 11-01-1970 MRN: [id number]   Date of last office visit: 01/23/23 DR. SKAINS Date of next office visit: NONE  Request for Surgical Clearance    Procedure:  PT WILL BE HAVING 2 TEETH EXTRACTED (1 SIMPLE AND 1 SURGICAL); ALONG WITH 2 BONE GRAFTS  Date of Surgery:  Clearance TBD  (HOWEVER PER DDS THE PT HAS A BAD INFECTION AS WELL)                             Surgeon:  DR. SHERLEAN IKE Surgeon's Group or Practice Name:  [facility]  Phone number:  [phone] Fax number:  [phone]   Type of Clearance Requested:   - Medical  - Pharmacy:  Hold Aspirin  and Warfarin (Coumadin )     Type of Anesthesia:  Local  MAY USE AS WELL    Additional requests/questions:    Signed, Kearstyn Avitia   08/25/2024, 11:59 AM

## 2024-08-25 NOTE — Telephone Encounter (Signed)
 Please advise holding coumadin  prior to dental extractions not yet scheduled but high priority, as patient has an infection.    Thank you!  DW

## 2024-08-26 NOTE — Telephone Encounter (Signed)
 Spoke with the patient to schedule OV preop clearance appt. Per pt request he wanted to see Dr. Jeffrie, provided him with Dr. Casimiro next avail 09/20/24 and he agreed to that appt date. Pt understands that this appt will be further out than he originally wanted (before the end of the year) but wants to keep the date given.

## 2024-09-02 MED ORDER — AMOXICILLIN 500 MG PO TABS: ORAL_TABLET | ORAL | 3 refills | Status: AC

## 2024-09-02 NOTE — Addendum Note (Signed)
 Addended by: Aldwin Micalizzi D on: 09/02/2024 08:15 AM   Modules accepted: Orders

## 2024-09-02 NOTE — Telephone Encounter (Signed)
 Patient with diagnosis of aortic vale replacement on warfarin for anticoagulation.    Procedure: 2 TEETH EXTRACTED (1 SIMPLE AND 1 SURGICAL); ALONG WITH 2 BONE GRAFTS  Date of procedure: TBD   Patient with post op afib, none since. He has lower INR goal due to OnX valve.  Patient does require pre-op antibiotics for dental procedure.  Per office protocol, patient can hold warfarin for 5 days prior to procedure.    Patient will not need bridging with Lovenox  (enoxaparin ) around procedure.  **This guidance is not considered finalized until pre-operative APP has relayed final recommendations.**

## 2024-09-07 ENCOUNTER — Ambulatory Visit: Admitting: Pharmacist

## 2024-09-07 DIAGNOSIS — Z5181 Encounter for therapeutic drug level monitoring: Secondary | ICD-10-CM | POA: Diagnosis not present

## 2024-09-07 DIAGNOSIS — Z952 Presence of prosthetic heart valve: Secondary | ICD-10-CM

## 2024-09-07 LAB — POCT INR: INR: 2 (ref 2.0–3.0)

## 2024-09-07 NOTE — Patient Instructions (Signed)
 Description   INR 2.0 Continue taking warfarin 1.5 tablets daily except for 2 tablets on Sundays, Tuesdays, and Thursdays. Recheck INR in 7 weeks. Anticoagulation Clinic: [phone]  Procedure Fax #[phone]

## 2024-09-07 NOTE — Progress Notes (Signed)
 Description   INR 2.0 Continue taking warfarin 1.5 tablets daily except for 2 tablets on Sundays, Tuesdays, and Thursdays. Recheck INR in 7 weeks. Anticoagulation Clinic: [phone]  Procedure Fax #[phone]

## 2024-09-20 ENCOUNTER — Ambulatory Visit: Attending: Cardiology | Admitting: Cardiology

## 2024-09-20 ENCOUNTER — Encounter: Payer: Self-pay | Admitting: Cardiology

## 2024-09-20 VITALS — BP 150/68 | HR 67 | Ht 70.0 in | Wt 247.0 lb

## 2024-09-20 DIAGNOSIS — Z952 Presence of prosthetic heart valve: Secondary | ICD-10-CM

## 2024-09-20 DIAGNOSIS — I480 Paroxysmal atrial fibrillation: Secondary | ICD-10-CM | POA: Diagnosis not present

## 2024-09-20 DIAGNOSIS — Z01818 Encounter for other preprocedural examination: Secondary | ICD-10-CM | POA: Diagnosis not present

## 2024-09-20 DIAGNOSIS — I2583 Coronary atherosclerosis due to lipid rich plaque: Secondary | ICD-10-CM

## 2024-09-20 DIAGNOSIS — I2510 Atherosclerotic heart disease of native coronary artery without angina pectoris: Secondary | ICD-10-CM | POA: Diagnosis not present

## 2024-09-20 NOTE — Progress Notes (Signed)
 " Cardiology Office Note:  .   Date:  09/20/2024  ID:  Gabriel Anderson, DOB 1971-05-29, MRN [id number] PCP: Hughie Sharper, MD  [facility] Providers Cardiologist:  Oneil Parchment, MD     History of Present Illness: .   Gabriel Anderson is a 54 y.o. male Discussed the use of AI scribe   History of Present Illness Gabriel Anderson is a 54 year old male who presents for a preoperative visit for teeth extraction.  He is preparing for the extraction of two teeth, one requiring a simple extraction and the other a surgical extraction, along with two bone grafts. He is currently on warfarin and follows up in the Coumadin  clinic.  He has a significant cardiac history, including aortic valve replacement with a 23 mm On-X mechanical valve and super coronary replacement due to an ascending aortic aneurysm on September 16, 2021. He underwent PCI to the LAD in 2013 and has minimal coronary disease as per cardiac catheterization on December 19, 2020. His last echocardiogram on November 04, 2021, showed low normal pump function with an ejection fraction of 50-55% and a mean gradient of 7 mmHg. No chest pain, fevers, chills, nausea, vomiting, syncope, or bleeding.  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 Metoprolol  50 mg BID previously caused erectile dysfunction, and he is off amiodarone  now. He receives testosterone replacement therapy.  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 His family history includes his wife Geni, who has been cleared of breast cancer, and his son Victory, who played football at [employer], played last year at [employer] in [place] .      ROS: No fevers chills nausea vomiting syncope bleeding  Studies Reviewed: SABRA   EKG Interpretation Date/Time:  Tuesday September 20 2024 16:41:05  EST Ventricular Rate:  67 PR Interval:  190 QRS Duration:  108 QT Interval:  400 QTC Calculation: 422 R Axis:   30  Text Interpretation: Normal sinus rhythm Incomplete right bundle branch block When compared with ECG of 19-Sep-2021 09:24, Premature atrial complexes are no longer Present Confirmed by Parchment Oneil ([id number]) on 09/20/2024 4:47:12 PM    Results Diagnostic Cardiac catheterization (12/19/2020): Minimal coronary artery disease Transthoracic echocardiogram (11/04/2021): Left ventricular ejection fraction 50-55%, mean aortic valve gradient 7 mmHg, low normal left ventricular systolic function Risk Assessment/Calculations:           Physical Exam:   VS:  BP (!) 150/68   Pulse 67   Ht 5' 10 (1.778 m)   Wt 247 lb (112 kg)   SpO2 96%   BMI 35.44 kg/m    Wt Readings from Last 3 Encounters:  09/20/24 247 lb (112 kg)  01/23/23 268 lb 3.2 oz (121.7 kg)  10/07/22 260 lb (117.9 kg)    GEN: Well nourished, well developed in no acute distress NECK: No JVD; No carotid bruits CARDIAC: Sharp S2 click, RRR, no murmurs, no rubs, no gallops RESPIRATORY:  Clear to auscultation without rales, wheezing or rhonchi  ABDOMEN: Soft, non-tender, non-distended EXTREMITIES:  No edema; No deformity   ASSESSMENT AND PLAN: .    Assessment and Plan Assessment & Plan Preoperative evaluation for dental extraction Low overall cardiac risk for dental surgery. Mechanical valve functioning well with no bleeding issues. INR monitored in Coumadin  clinic, they will give  schedule on holding Coumadin . - Proceed with dental surgery - Take dental antibiotics  Mechanical aortic valve replacement (On-X, 23 mm) with history of ascending aortic aneurysm repair Mechanical valve functioning well with no bleeding issues. INR monitored in Coumadin  clinic. Last echocardiogram showed low normal pump function with mean gradient of 7 mmHg. Sinus rhythm with rate of 67 bpm.  Coronary artery disease, prior LAD stent  2013 Minimal coronary disease on cardiac catheterization prior to valve replacement. No current chest pain or other symptoms.   Paroxysmal atrial fibrillation, post-operative Currently in sinus rhythm with rate of 67 bpm.         Dispo: 1 yr  Signed, Oneil Parchment, MD  "

## 2024-09-20 NOTE — Patient Instructions (Signed)
 Medication Instructions:  The current medical regimen is effective;  continue present plan and medications.  *If you need a refill on your cardiac medications before your next appointment, please call your pharmacy*  Follow-Up: At [facility], you and your health needs are our priority.  As part of our continuing mission to provide you with exceptional heart care, our providers are all part of one team.  This team includes your primary Cardiologist (physician) and Advanced Practice Providers or APPs (Physician Assistants and Nurse Practitioners) who all work together to provide you with the care you need, when you need it.  Your next appointment:   1 year(s)  Provider:   Oneil Parchment, MD    We recommend signing up for the patient portal called MyChart.  Sign up information is provided on this After Visit Summary.  MyChart is used to connect with patients for Virtual Visits (Telemedicine).  Patients are able to view lab/test results, encounter notes, upcoming appointments, etc.  Non-urgent messages can be sent to your provider as well.   To learn more about what you can do with MyChart, go to [email or web address].    OK for surgery as scheduled per Dr Parchment.

## 2024-09-23 ENCOUNTER — Emergency Department (HOSPITAL_BASED_OUTPATIENT_CLINIC_OR_DEPARTMENT_OTHER)
Admission: EM | Admit: 2024-09-23 | Discharge: 2024-09-23 | Disposition: A | Discharge status: Home / Self Care | Attending: Emergency Medicine | Admitting: Emergency Medicine

## 2024-09-23 ENCOUNTER — Encounter (HOSPITAL_BASED_OUTPATIENT_CLINIC_OR_DEPARTMENT_OTHER): Payer: Self-pay

## 2024-09-23 ENCOUNTER — Other Ambulatory Visit: Payer: Self-pay

## 2024-09-23 DIAGNOSIS — Z7982 Long term (current) use of aspirin: Secondary | ICD-10-CM | POA: Diagnosis not present

## 2024-09-23 DIAGNOSIS — K047 Periapical abscess without sinus: Secondary | ICD-10-CM | POA: Diagnosis not present

## 2024-09-23 DIAGNOSIS — Z7901 Long term (current) use of anticoagulants: Secondary | ICD-10-CM | POA: Insufficient documentation

## 2024-09-23 DIAGNOSIS — K0889 Other specified disorders of teeth and supporting structures: Secondary | ICD-10-CM | POA: Diagnosis present

## 2024-09-23 NOTE — ED Triage Notes (Signed)
 Pt reports redness and swelling to right cheek which he states is from an infected tooth, onset Wednesday night. He is currently awaiting clearance for teeth extraction from his cardiologist. He is currently taking Amoxicillin .

## 2024-09-23 NOTE — Discharge Instructions (Signed)
 Please continue taking your antibiotics as prescribed.  Please follow-up with your dentist.  As we discussed if your symptoms worsen you are having trouble talking or swallowing, please return to the emergency department for repeat evaluation.  On the schedule it looks like we have an oral surgeon on Monday and Thursday of next week.

## 2024-09-23 NOTE — ED Provider Notes (Signed)
 " [place] EMERGENCY DEPARTMENT AT [facility] Provider Note   CSN: [id number] Arrival date & time: 09/23/24  2128     Patient presents with: Facial Swelling   Gabriel Anderson is a 54 y.o. male.   54 yo M with a chief complaints of right upper dental pain.  He has had some issues with this tooth and is getting scheduled to be seen by the dentist.  His dentist required him to be cleared by his cardiologist which is just occurred this week.  He woke up this morning with significant pain and swelling to the right upper aspect of his jaw and into his face.  He ended up seeing his PCP virtually and was prescribed antibiotics.  He has like the swelling has improved quite a bit.        Prior to Admission medications  Medication Sig Start Date End Date Taking? Authorizing Provider  acetaminophen  (TYLENOL ) 500 MG tablet Take 1,000 mg by mouth every 8 (eight) hours as needed for moderate pain.    [provider]  acidophilus (RISAQUAD) CAPS capsule Take 1 capsule by mouth daily. Patient not taking: Reported on 09/20/2024    [provider]  amoxicillin  (AMOXIL ) 500 MG tablet Take 2000 mg (4 tablets) by mouth one hour prior to dental procedures Patient not taking: Reported on 09/20/2024 09/02/24   Jeffrie Oneil BROCKS, MD  aspirin  81 MG tablet Take 1 tablet (81 mg total) by mouth daily. 04/09/16   Jeffrie Oneil BROCKS, MD  Cholecalciferol  (D3 SUPER STRENGTH PO) Take 1 tablet by mouth daily.    [provider]  ibuprofen  (ADVIL ) 800 MG tablet Take 1 tablet (800 mg total) by mouth every 8 (eight) hours as needed for mild pain. 05/07/23   Elnor Hila P, DO  nitroGLYCERIN  (NITROSTAT ) 0.4 MG SL tablet Place 1 tablet (0.4 mg total) under the tongue every 5 (five) minutes as needed. Max of 3 doses, then 911 12/16/21   Jeffrie Oneil BROCKS, MD  rosuvastatin  (CRESTOR ) 40 MG tablet TAKE 0.5 TABLETS BY MOUTH DAILY. PLEASE CALL [phone] FOR FUTURE REFILLS. THANK YOU. 03/31/24   Jeffrie Oneil BROCKS,  MD  tiZANidine  (ZANAFLEX ) 4 MG tablet Take 1 tablet (4 mg total) by mouth every 6 (six) hours as needed for muscle spasms. 11/17/22   Vernetta Lonni GRADE, MD  warfarin (COUMADIN ) 5 MG tablet Take 1.5 tablets (7.5mg ) to 2 tablets (10mg ) by mouth daily or as instructed by the Anticoagulation Clinic. 02/24/24   Jeffrie Oneil BROCKS, MD    Allergies: Patient has no known allergies.    Review of Systems  Updated Vital Signs BP (!) 145/89   Pulse 93   Temp 97.9 F (36.6 C)   Resp 20   Ht 5' 10 (1.778 m)   Wt 112 kg   SpO2 98%   BMI 35.44 kg/m   Physical Exam Vitals and nursing note reviewed.  Constitutional:      Appearance: He is well-developed.  HENT:     Head: Normocephalic and atraumatic.     Mouth/Throat:     Comments: Patient has a small ulcer about the right canine.  Poor dentition along the area.  No obvious palpable area of fluctuance or induration.  He does have some erythema and edema overlying the right maxillary sinus. Eyes:     Pupils: Pupils are equal, round, and reactive to light.  Neck:     Vascular: No JVD.  Cardiovascular:     Rate and Rhythm: Normal rate  and regular rhythm.     Heart sounds: No murmur heard.    No friction rub. No gallop.  Pulmonary:     Effort: No respiratory distress.     Breath sounds: No wheezing.  Abdominal:     General: There is no distension.     Tenderness: There is no abdominal tenderness. There is no guarding or rebound.  Musculoskeletal:        General: Normal range of motion.     Cervical back: Normal range of motion and neck supple.  Skin:    Coloration: Skin is not pale.     Findings: No rash.  Neurological:     Mental Status: He is alert and oriented to person, place, and time.  Psychiatric:        Behavior: Behavior normal.     (all labs ordered are listed, but only abnormal results are displayed) Labs Reviewed - No data to display  EKG: None  Radiology: No results found.   Procedures   Medications Ordered  in the ED - No data to display                                  Medical Decision Making  54 yo M with a chief complaints of right upper dental pain and facial swelling.  Has been having trouble with his teeth and had a planned extraction coming up but had developed some facial swelling this morning.  Had called his PCP who started him on Augmentin.  His PCP did caution that he might need to come to the ER to be evaluated and perhaps need CT imaging.  Since this time he talked with him the swelling is actually improved significantly.  I do not palpate an area that I think would benefit from I&D.  As he is having improvement with antibiotic therapy am not sure he benefit from CT imaging.  I did encourage him to follow-up with his dentist.  10:24 PM:  I have discussed the diagnosis/risks/treatment options with the patient and family.  Evaluation and diagnostic testing in the emergency department does not suggest an emergent condition requiring admission or immediate intervention beyond what has been performed at this time.  They will follow up with PCP, dentistry. We also discussed returning to the ED immediately if new or worsening sx occur. We discussed the sx which are most concerning (e.g., sudden worsening pain, fever, inability to tolerate by mouth) that necessitate immediate return. Medications administered to the patient during their visit and any new prescriptions provided to the patient are listed below.  Medications given during this visit Medications - No data to display   The patient appears reasonably screen and/or stabilized for discharge and I doubt any other medical condition or other [facility] requiring further screening, evaluation, or treatment in the ED at this time prior to discharge.       Final diagnoses:  Dental abscess    ED Discharge Orders     None          Emil Share, DO 09/23/24 2224  "

## 2024-10-26 ENCOUNTER — Ambulatory Visit
